# Patient Record
Sex: Female | Born: 1990 | Race: White | Hispanic: No
Health system: Midwestern US, Community
[De-identification: ages and names within clinical notes are randomized; demographics above are authoritative.]

## PROBLEM LIST (undated history)

## (undated) DIAGNOSIS — N390 Urinary tract infection, site not specified: Secondary | ICD-10-CM

## (undated) DIAGNOSIS — F32A Depression, unspecified: Secondary | ICD-10-CM

## (undated) DIAGNOSIS — G43909 Migraine, unspecified, not intractable, without status migrainosus: Secondary | ICD-10-CM

## (undated) DIAGNOSIS — R519 Headache, unspecified: Secondary | ICD-10-CM

## (undated) DIAGNOSIS — F4310 Post-traumatic stress disorder, unspecified: Secondary | ICD-10-CM

## (undated) DIAGNOSIS — K589 Irritable bowel syndrome without diarrhea: Secondary | ICD-10-CM

## (undated) DIAGNOSIS — T7840XA Allergy, unspecified, initial encounter: Secondary | ICD-10-CM

## (undated) HISTORY — PX: WISDOM TOOTH EXTRACTION: SHX21

## (undated) HISTORY — DX: Allergy, unspecified, initial encounter: T78.40XA

## (undated) HISTORY — DX: Depression, unspecified: F32.A

## (undated) HISTORY — PX: ARTHROSCOPIC REPAIR ACL: SUR80

## (undated) HISTORY — PX: CHOLECYSTECTOMY: SHX55

## (undated) HISTORY — DX: Irritable bowel syndrome, unspecified: K58.9

## (undated) HISTORY — DX: Post-traumatic stress disorder, unspecified: F43.10

## (undated) HISTORY — DX: Headache, unspecified: R51.9

## (undated) HISTORY — DX: Urinary tract infection, site not specified: N39.0

## (undated) HISTORY — DX: Migraine, unspecified, not intractable, without status migrainosus: G43.909

## (undated) MED ORDER — HYDROCODONE-ACETAMINOPHEN 5 MG-325 MG TAB
5-325 mg | ORAL_TABLET | Freq: Four times a day (QID) | ORAL | Status: DC | PRN
Start: ? — End: 2015-10-15

## (undated) MED ORDER — NAPROXEN 500 MG TAB
500 mg | ORAL_TABLET | Freq: Two times a day (BID) | ORAL | Status: AC
Start: ? — End: 2012-12-11

---

## 2012-12-04 MED ADMIN — ibuprofen (MOTRIN) tablet 800 mg: ORAL | @ 23:00:00 | NDC 00904585361

## 2012-12-04 NOTE — ED Notes (Signed)
I have reviewed discharge instructions with the patient.  The patient verbalized understanding.    Patient armband removed and given to patient to take home.  Patient was informed of the privacy risks if armband lost or stolen    The patient Kristina Baker is a 22 y.o. female who  has a past medical history of Anxiety; Depressed; and ADD (attention deficit disorder)..  She   The patient's medications were reviewed and discussed prior to discharge. The patient was very interactive and did understand their medications.    The following medications were discussed in details with the patient. The patient said [facility].      [email or web address] @    Luverta Korte Benay Pike, RN    MyChart Activation    Thank you for requesting access to MyChart. Please follow the instructions below to securely access and download your online medical record. MyChart allows you to send messages to your doctor, view your test results, renew your prescriptions, schedule appointments, and more.    How Do I Sign Up?    1. In your internet browser, go to [email or web address]  2. Click on the First Time User? Click Here link in the Sign In box. You will be redirect to the New Member Sign Up page.  3. Enter your MyChart Access Code exactly as it appears below. You will not need to use this code after you???ve completed the sign-up process. If you do not sign up before the expiration date, you must request a new code.    MyChart Access Code: [email or web address] @ (This is the date your MyChart access code will expire)    4. Enter the last four digits of your Social Security Number (xxxx) and Date of Birth (mm/dd/yyyy) as indicated and click Submit. You will be taken to the next sign-up page.  5. Create a MyChart ID. This will be your MyChart login ID and cannot be changed, so think of one that is secure and easy to remember.  6. Create a MyChart password. You can change your password at any time.  7. Enter your Password Reset  Question and Answer. This can be used at a later time if you forget your password.   8. Enter your e-mail address. You will receive e-mail notification when new information is available in MyChart.  9. Click Sign Up. You can now view and download portions of your medical record.  10. Click the Download Summary menu link to download a portable copy of your medical information.    Additional Information    If you have questions, please visit the Frequently Asked Questions section of the MyChart website at [email or web address] Remember, MyChart is NOT to be used for urgent needs. For medical emergencies, dial 911.

## 2012-12-04 NOTE — ED Provider Notes (Addendum)
HPI Comments: Kristina Baker is a 22 y.o. female with a History of anxiety, depression and ADD presents to the emergency department with c/o left knee pain. [place] the dog park [place] 5-6 dogs knocked her down. The dogs hit her at the level of her knee. She was able to get up with the assistance of bystanders but was unable to bear weight on the leg. Pt previously had an ACL repair in her left knee in 2007. She denies fever, chills, SOB, chest pain, abdominal pain, NVD, headache and dizziness. She expresses no other complaints at this time.     The history is provided by the patient.        Past Medical History   Diagnosis Date   ??? Anxiety    ??? Depressed    ??? ADD (attention deficit disorder)         History reviewed. No pertinent past surgical history.      History reviewed. No pertinent family history.     History     Social History   ??? Marital Status: SINGLE     Spouse Name: N/A     Number of Children: N/A   ??? Years of Education: N/A     Occupational History   ??? Not on file.     Social History Main Topics   ??? Smoking status: Never Smoker    ??? Smokeless tobacco: Not on file   ??? Alcohol Use: Yes      Comment: occ   ??? Drug Use: No   ??? Sexually Active: Not on file     Other Topics Concern   ??? Not on file     Social History Narrative   ??? No narrative on file                  ALLERGIES: Review of patient's allergies indicates no known allergies.      Review of Systems   Constitutional: Negative for fever and chills.   HENT: Negative for congestion and sore throat.    Eyes: Negative for visual disturbance.   Respiratory: Negative for shortness of breath.    Cardiovascular: Negative for chest pain.   Gastrointestinal: Negative for nausea, vomiting, abdominal pain and diarrhea.   Genitourinary: Negative for dysuria.   Musculoskeletal: Negative for back pain.        (+) left knee pain   Skin: Negative for rash.   Neurological: Negative for dizziness and headaches.   Psychiatric/Behavioral: Negative for dysphoric mood.        Filed Vitals:    12/04/12 1743   BP: 134/88   Pulse: 102   Temp: 98.5 ??F (36.9 ??C)   Resp: 16   Height: 5\' 6"  (1.676 m)   Weight: 102.059 kg (225 lb)   SpO2: 99%            Physical Exam   Nursing note and vitals reviewed.  Constitutional: She is oriented to person, place, and time. She appears well-developed and well-nourished. No distress.   HENT:   Head: Normocephalic and atraumatic.   Nose: Nose normal.   Eyes: EOM are normal.   Neck: Normal range of motion.   Cardiovascular: Intact distal pulses.    Pulmonary/Chest: Effort normal. No respiratory distress.   Abdominal: Soft. She exhibits no distension.   Musculoskeletal: She exhibits tenderness. She exhibits no edema.        Left knee: She exhibits no LCL laxity and no MCL laxity. Tenderness (above the  knee) found. No medial joint line and no lateral joint line tenderness noted.   Negative anterior and posterior drawer test. Pain at the knee with extension of knee. Post surgical scar to left knee, healed.    Neurological: She is alert and oriented to person, place, and time. She exhibits normal muscle tone. Coordination normal.   Sensation intact; good strength but some pain limitaiton to extend left knee   Skin: Skin is warm and dry. No rash noted. She is not diaphoretic.   Psychiatric: She has a normal mood and affect.        MDM     Differential Diagnosis; Clinical Impression; Plan:     Strain vs dislocation vs ligament/meniscal injury    Xray    Analgesia    Ace wrap and crutches.    Work note. [facility]  Rx motrin, vicodin  Amount and/or Complexity of Data Reviewed:   Tests in the radiology section of CPT??:  Ordered and reviewed  Risk of Significant Complications, Morbidity, and/or Mortality:   Presenting problems:  Moderate  Progress:   Patient progress:  Stable      Procedures    -------------------------------------------------------------------------------------------------------------------  PROGRESS NOTES:  5:43 PM: Ewing Schlein, DO is at the  bedside evaluating the patient. Discussed treatment plan with the patient.   6:33 PM: Apply ace wrap to knee and gave patient crutches. Pre and post ace wrap placement, neurovascular intact. Discussed diagnosis, prescriptions and follow up instructions. Patient is stable and ready for discharge.     CONSULTATIONS:  None    ORDERS:  Orders Placed This Encounter   ??? XR KNEE LT MIN 4 V   ??? APPLY ICE TO SPECIFIED AREA   ??? APPLY ACE WRAP:SPECIFY ONE TIME STAT   ??? CRUTCHES WITH INSTRUCTIONS   ??? traZODone (DESYREL) 50 mg tablet   ??? LORazepam (ATIVAN) 0.5 mg tablet   ??? buPROPion (WELLBUTRIN) 75 mg tablet   ??? OTHER   ??? venlafaxine-SR (EFFEXOR XR) 37.5 mg capsule   ??? methylphenidate (RITALIN) 5 mg tablet   ??? ibuprofen (MOTRIN) tablet 800 mg   ??? HYDROcodone-acetaminophen (NORCO) 5-325 mg per tablet   ??? naproxen (NAPROSYN) 500 mg tablet       MEDICATIONS:  Medications   ibuprofen (MOTRIN) tablet 800 mg (800 mg Oral Given 12/04/12 1756)        RADIOLOGY RESULTS:  XR KNEE LT MIN 4 V  interpretation per Ewing Schlein, DO: Postsurgical screws. No fracture.         LAB & EKG RESULTS:   No results found for this or any previous visit (from the past 12 hour(s)).     DISPOSITION:   Diagnosis:   1. Left knee sprain, initial encounter          Disposition: Discharged home    Follow-up Information    Follow up With Details Comments Contact Info    Wilford Riki Altes, MD Schedule an appointment as soon as possible for a visit  [address]  [address]               c  [facility] [place] [address]  [phone]      [facility] EMERGENCY DEPT  If symptoms worsen [address]  [place] [address]  [phone]          Current Discharge Medication List      START taking these medications    Details   HYDROcodone-acetaminophen (NORCO) 5-325  mg per tablet Take 1 tablet by mouth every six (6) hours as needed for Pain.  Qty: 12 tablet, Refills: 0      naproxen (NAPROSYN) 500 mg tablet Take 1 tablet by mouth two (2) times daily  (with meals) for 7 days. PRN pain  Qty: 14 tablet, Refills: 0         CONTINUE these medications which have NOT CHANGED    Details   traZODone (DESYREL) 50 mg tablet Take 50 mg by mouth nightly.      LORazepam (ATIVAN) 0.5 mg tablet Take 0.5 mg by mouth.      buPROPion (WELLBUTRIN) 75 mg tablet Take  by mouth two (2) times a day.      OTHER Birth control      venlafaxine-SR (EFFEXOR XR) 37.5 mg capsule Take 37.5 mg by mouth daily.      methylphenidate (RITALIN) 5 mg tablet Take 5 mg by mouth.              Teacher, early years/pre written by: Donell Beers (5:51 PM), scribing for and in the presence of Ewing Schlein, DO, ED Provider.    PROVIDER ATTESTATION STATEMENT   I personally performed the services described in the documentation, reviewed the documentation, as recorded by the scribe in my presence, and it accurately and completely records my words and actions.   Ewing Schlein, DO. 6:32 PM   -------------------------------------------------------------------------------------------------------------------

## 2013-02-20 NOTE — Progress Notes (Signed)
A user error has taken place: the following encounter has been closed for administrative purposes.

## 2015-09-23 ENCOUNTER — Emergency Department: Admit: 2015-09-23 | Payer: PRIVATE HEALTH INSURANCE | Primary: Internal Medicine

## 2015-09-23 ENCOUNTER — Inpatient Hospital Stay
Admit: 2015-09-23 | Discharge: 2015-09-26 | Disposition: A | Discharge status: Home / Self Care | Payer: PRIVATE HEALTH INSURANCE | Attending: Family Medicine | Admitting: Family Medicine

## 2015-09-23 DIAGNOSIS — A419 Sepsis, unspecified organism: Principal | ICD-10-CM

## 2015-09-23 LAB — METABOLIC PANEL, COMPREHENSIVE
A-G Ratio: 0.8 (ref 0.8–1.7)
ALT (SGPT): 158 U/L — ABNORMAL HIGH (ref 13–56)
AST (SGOT): 77 U/L — ABNORMAL HIGH (ref 15–37)
Albumin: 3.9 g/dL (ref 3.4–5.0)
Alk. phosphatase: 83 U/L (ref 45–117)
Anion gap: 13 mmol/L (ref 3.0–18)
BUN/Creatinine ratio: 15 (ref 12–20)
BUN: 15 MG/DL (ref 7.0–18)
Bilirubin, total: 0.9 MG/DL (ref 0.2–1.0)
CO2: 21 mmol/L (ref 21–32)
Calcium: 9.4 MG/DL (ref 8.5–10.1)
Chloride: 103 mmol/L (ref 100–108)
Creatinine: 0.99 MG/DL (ref 0.6–1.3)
GFR est AA: >60 mL/min/{1.73_m2} (ref >60)
GFR est non-AA: >60 mL/min/{1.73_m2} (ref >60)
Globulin: 4.6 g/dL — ABNORMAL HIGH (ref 2.0–4.0)
Glucose: 143 mg/dL — ABNORMAL HIGH (ref 74–99)
Potassium: 4.6 mmol/L (ref 3.5–5.5)
Protein, total: 8.5 g/dL — ABNORMAL HIGH (ref 6.4–8.2)
Sodium: 137 mmol/L (ref 136–145)

## 2015-09-23 LAB — CBC WITH AUTOMATED DIFF
ABS. BASOPHILS: 0 10*3/uL (ref 0.0–0.06)
ABS. EOSINOPHILS: 0 10*3/uL (ref 0.0–0.4)
ABS. LYMPHOCYTES: 0.6 10*3/uL — ABNORMAL LOW (ref 0.9–3.6)
ABS. MONOCYTES: 0.8 10*3/uL (ref 0.05–1.2)
ABS. NEUTROPHILS: 17.4 10*3/uL — ABNORMAL HIGH (ref 1.8–8.0)
BASOPHILS: 0 % (ref 0–2)
EOSINOPHILS: 0 % (ref 0–5)
HCT: 44.9 % (ref 35.0–45.0)
HGB: 15.2 g/dL (ref 12.0–16.0)
LYMPHOCYTES: 3 % — ABNORMAL LOW (ref 21–52)
MCH: 31.2 PG (ref 24.0–34.0)
MCHC: 33.9 g/dL (ref 31.0–37.0)
MCV: 92.2 FL (ref 74.0–97.0)
MONOCYTES: 4 % (ref 3–10)
MPV: 9.8 FL (ref 9.2–11.8)
NEUTROPHILS: 93 % — ABNORMAL HIGH (ref 40–73)
PLATELET: 348 10*3/uL (ref 135–420)
RBC: 4.87 M/uL (ref 4.20–5.30)
RDW: 12.1 % (ref 11.6–14.5)
WBC: 18.8 10*3/uL — ABNORMAL HIGH (ref 4.6–13.2)

## 2015-09-23 LAB — URINE MICROSCOPIC ONLY
RBC: 0 /hpf (ref 0–5)
WBC: 0 /hpf (ref 0–4)

## 2015-09-23 LAB — URINALYSIS W/ RFLX MICROSCOPIC
Blood: NEGATIVE
Glucose: NEGATIVE mg/dL
Nitrites: NEGATIVE
Specific gravity: >1.03 — ABNORMAL HIGH (ref 1.005–1.030)
Urobilinogen: 0.2 EU/dL (ref 0.2–1.0)
pH (UA): 5.5 (ref 5.0–8.0)

## 2015-09-23 LAB — HCG URINE, QL: HCG urine, QL: NEGATIVE

## 2015-09-23 LAB — LIPASE: Lipase: 109 U/L (ref 73–393)

## 2015-09-23 LAB — POC LACTIC ACID: Lactic Acid (POC): 3.6 mmol/L — CR (ref 0.4–2.0)

## 2015-09-23 MED ORDER — PIPERACILLIN-TAZOBACTAM 4.5 GRAM IV SOLR
4.5 gram | Freq: Four times a day (QID) | INTRAVENOUS | Status: DC
Start: 2015-09-23 — End: 2015-09-24
  Administered 2015-09-23 – 2015-09-24 (×2): via INTRAVENOUS

## 2015-09-23 MED ORDER — SODIUM CHLORIDE 0.9 % IJ SYRG
INTRAMUSCULAR | Status: DC | PRN
Start: 2015-09-23 — End: 2015-09-26

## 2015-09-23 MED ORDER — VANCOMYCIN 1,000 MG IV SOLR
1000 mg | Freq: Once | INTRAVENOUS | Status: AC
Start: 2015-09-23 — End: 2015-09-23
  Administered 2015-09-23: via INTRAVENOUS

## 2015-09-23 MED ORDER — SODIUM CHLORIDE 0.9% BOLUS IV
0.9 % | Freq: Once | INTRAVENOUS | Status: AC
Start: 2015-09-23 — End: 2015-09-23
  Administered 2015-09-23: via INTRAVENOUS

## 2015-09-23 MED ORDER — SODIUM CHLORIDE 0.9% BOLUS IV
0.9 % | Freq: Once | INTRAVENOUS | Status: AC
Start: 2015-09-23 — End: 2015-09-23
  Administered 2015-09-23: 22:00:00 via INTRAVENOUS

## 2015-09-23 MED ORDER — LEVOFLOXACIN IN D5W 750 MG/150 ML IV PIGGY BACK
750 mg/150 mL | INTRAVENOUS | Status: DC
Start: 2015-09-23 — End: 2015-09-25
  Administered 2015-09-23 – 2015-09-24 (×2): via INTRAVENOUS

## 2015-09-23 MED ORDER — IOPAMIDOL 61 % IV SOLN
300 mg iodine /mL (61 %) | Freq: Once | INTRAVENOUS | Status: AC
Start: 2015-09-23 — End: 2015-09-23
  Administered 2015-09-23: 23:00:00 via INTRAVENOUS

## 2015-09-23 MED ORDER — SODIUM CHLORIDE 0.9 % IV PIGGY BACK
3.375 gram | Freq: Four times a day (QID) | INTRAVENOUS | Status: DC
Start: 2015-09-23 — End: 2015-09-23

## 2015-09-23 MED ORDER — VANCOMYCIN 10 GRAM IV SOLR
10 gram | Freq: Once | INTRAVENOUS | Status: AC
Start: 2015-09-23 — End: 2015-09-24
  Administered 2015-09-24: 04:00:00 via INTRAVENOUS

## 2015-09-23 MED FILL — LEVOFLOXACIN IN D5W 750 MG/150 ML IV PIGGY BACK: 750 mg/150 mL | INTRAVENOUS | Qty: 150

## 2015-09-23 MED FILL — ISOVUE-300  61 % INTRAVENOUS SOLUTION: 300 mg iodine /mL (61 %) | INTRAVENOUS | Qty: 100

## 2015-09-23 MED FILL — SODIUM CHLORIDE 0.9 % IV: INTRAVENOUS | Qty: 1000

## 2015-09-23 MED FILL — SODIUM CHLORIDE 0.9 % IV: INTRAVENOUS | Qty: 3500

## 2015-09-23 MED FILL — PIPERACILLIN-TAZOBACTAM 4.5 GRAM IV SOLR: 4.5 gram | INTRAVENOUS | Qty: 4.5

## 2015-09-23 MED FILL — BD POSIFLUSH NORMAL SALINE 0.9 % INJECTION SYRINGE: INTRAMUSCULAR | Qty: 10

## 2015-09-23 MED FILL — VANCOMYCIN 1,000 MG IV SOLR: 1000 mg | INTRAVENOUS | Qty: 1000

## 2015-09-23 MED FILL — VANCOMYCIN 10 GRAM IV SOLR: 10 gram | INTRAVENOUS | Qty: 1500

## 2015-09-23 NOTE — ED Notes (Signed)
Per CT tech patient was taken to xray from CT. Unable to start medications until pt returns.

## 2015-09-23 NOTE — Progress Notes (Signed)
Pharmacy Dosing Services: Vancomycin    Indication: Sepsis of Unknown Etiology    Day of therapy: 0    Other Antimicrobials (Include dose, start day & day of therapy):  Levofloxacin 750 mg every 24 hours, 1826 09-23-2015, Piperacillin-Tazobactam 4.5 grams every 6 hours, 1826 09-23-2015    Loading dose (date given): 1,000 + 1,500 = 2,500 mg bolus  Current Maintenance dose:  1,000 mg every 8 hours    Goal Vancomycin Level: 15-20 mcg/mL  (Trough 15-20 for most infections, 20 for meningitis/osteomyelitis, pre-HD level ~25)     Significant Cultures: 2 blood cultures taken    Renal function stable? (unstable defined as SCr increase of 0.5 mg/dL or > 16% increase from baseline, whichever is greater) (Y/N): Y     CAPD, Hemodialysis or Renal Replacement Therapy (Y/N): Y     Recent Labs      09/23/15   1755   CREA  0.99   BUN  15   WBC  18.8*     Temp (24hrs), Avg:98.5 ??F (36.9 ??C), Min:97.4 ??F (36.3 ??C), Max:99.9 ??F (37.7 ??C)    Creatinine Clearance (Creatinine Clearance (ml/min)): 102.7 mcg/mL     Regimen assessment: Sepsis patient starting vancomycin, Zosyn and   Maintenance dose: 1,000 mg every 8 hours  Next scheduled level: 09-25-2015 at 0730       Pharmacy will follow daily and adjust medications as appropriate for renal function and/or serum levels.    Thank you,  [employer].D.

## 2015-09-23 NOTE — Progress Notes (Addendum)
Received patient into room 2101 from ED. Patient's mom at bedside. Oriented patient to room and call bell. Call bell and belongings within reach. Vitals and assessment to be completed.     @0200 . Stool specimen collected and sent to lab.     @0750  Bedside shift change report given to Victorino DikeJennifer, Charity fundraiserN (oncoming nurse) by Iva LentoStar RN (offgoing nurse). Report included the following information SBAR.

## 2015-09-23 NOTE — ED Triage Notes (Signed)
Patient states she ate a meal at [facility] yesterday and has been vomiting since then, diarrhea is uncontrollable

## 2015-09-23 NOTE — ED Notes (Signed)
Pt's redness on her face is decreasing greatly.

## 2015-09-23 NOTE — ED Notes (Addendum)
Pt reported hot flushing of her face, per Dr. Romie Jumper to stop the Vancomycin for an hour and give her 25 mg of Benadryl, and restart vanc at half the rate.

## 2015-09-23 NOTE — ED Provider Notes (Signed)
Patient is a 25 y.o. female presenting with abdominal pain, vomiting, diarrhea, and dizziness. The history is provided by the patient.   Abdominal Pain    Associated symptoms include diarrhea and vomiting.   Vomiting    Associated symptoms include abdominal pain and diarrhea.   Diarrhea    Associated symptoms include diarrhea and vomiting.   Dizziness   Associated symptoms include vomiting.   Kristina Baker is a 25 y.o. female with history of ADD, anxiety presents with nausea, vomiting, diarrhea, abdominal pain for the past day. Was driving home from [place].stopped at [place] and had chicken last night at 9 pm. Started not feeling well last night but this morning started with diarrhea, N/V and mild abdominal pain. Denies fever.   Past Medical History:   Diagnosis Date   ??? ADD (attention deficit disorder)    ??? Anxiety    ??? Depressed        No past surgical history on file.      No family history on file.    Social History     Social History   ??? Marital status: SINGLE     Spouse name: N/A   ??? Number of children: N/A   ??? Years of education: N/A     Occupational History   ??? Not on file.     Social History Main Topics   ??? Smoking status: Never Smoker   ??? Smokeless tobacco: Not on file   ??? Alcohol use Yes      Comment: occ   ??? Drug use: No   ??? Sexual activity: Not on file     Other Topics Concern   ??? Not on file     Social History Narrative   ??? No narrative on file         ALLERGIES: Review of patient's allergies indicates no known allergies.    Review of Systems   Gastrointestinal: Positive for abdominal pain, diarrhea and vomiting.   Neurological: Positive for dizziness.   Constitutional:  Denies malaise, fever, chills.   Head:  Denies injury.   Face:  Denies injury or pain.   ENMT:  Denies sore throat.   Neck:  Denies injury or pain.   Chest:  Denies injury.   Cardiac:  Denies chest pain or palpitations.   Respiratory:  Denies cough, wheezing, difficulty breathing, shortness of breath.    GI/ABD:  pain, nausea, vomiting, diarrhea. Denies injury   GU:  Denies injury, pain, dysuria or urgency.   Back:  Denies injury or pain.   Pelvis:  Denies injury or pain.   Extremity/MS:  Denies injury or pain.   Neuro:   Dizziness, Denies headache, LOC, neurologic symptoms/deficits/paresthesias.   Skin: Denies injury, rash, itching or skin changes.      Vitals:    09/23/15 1739   BP: 124/83   Pulse: (!) 114   Resp: 16   Temp: 97.4 ??F (36.3 ??C)   SpO2: 100%   Weight: 101.6 kg (224 lb)   Height: 5' 6" (1.676 m)            Physical Exam   Nursing note and vitals reviewed.  CONSTITUTIONAL: Alert, in no apparent distress; well-developed; well-nourished.   HEAD:  Normocephalic, atraumatic.   EYES: PERRL; EOM's intact.   ENTM: Nose: No rhinorrhea; Throat: mucous membranes moist. Posterior pharynx-normal.  Neck:  No JVD, supple without lymphadenopathy.  RESP: Chest clear, equal breath sounds.   CV: S1 and S2 WNL; No murmurs, gallops  or rubs.   GI: Abdomen soft and mild generalized tenderness, +BS, NG, NR. No masses or organomegaly.   UPPER EXT:  Normal inspection.   LOWER EXT: Normal inspection.  NEURO: CN 3-12 grossly intact, no pronator drift, strength 5/5 and sym, sensation intact.   SKIN: No rashes; Normal for age and stage.   PSYCH:  Alert and oriented, normal affect.       MDM  Number of Diagnoses or Management Options  Non-intractable vomiting with nausea, unspecified vomiting type:   Sepsis, due to unspecified organism [facility]):   Diagnosis management comments: DDx: gastroenteritis, GERD, hernia, hepatitis, pancreatitis, gallbladder etiology, constipation, adhesions, UTI, pyelo, kidney stones, STD,  Fitz-Hugh-Curtis syndrome, preg, ectopic, ovarian cyst, ovarian torsion, tubo-ovarian abscess, appendicitis, diverticulitis, SBO, GI bleed, mesenteric ischemia, AAA, cardiac etiology, musculoskeletal pain/spasm, malignancy  IMPRESSION AND MEDICAL DECISION MAKING:   Based upon the patient's presentation with noted HPI and PE, along with the work up done in the emergency department, I believe that the patient is septic. Will consult with hospitalist for admission.         Amount and/or Complexity of Data Reviewed  Clinical lab tests: ordered and reviewed  Tests in the radiology section of CPT??: ordered and reviewed  Tests in the medicine section of CPT??: ordered and reviewed  Discuss the patient with other providers: yes (Spoke with Hospitalist and agrees to admit Pt. States wants attending to read CT. Called Radiology attending and requested reading.)  Independent visualization of images, tracings, or specimens: yes      ED Course     Sepsis 3-6 hour reevaluation and exam:       Reevaluation:    Vital Signs:   Patient Vitals for the past 12 hrs:   Temp Pulse Resp BP SpO2   09/23/15 1937 98 ??F (36.7 ??C) (!) 116 18 103/86 100 %   09/23/15 1822 - 90 - - -   09/23/15 1739 97.4 ??F (36.3 ??C) (!) 114 16 124/83 100 %       Cardiopulmonary assessment:  RESP: Chest clear, equal breath sounds.  CV: S1 and S2 WNL; No murmurs, gallops or rubs.  Capillary refill:  <2  Peripheral pulse:   good    Skin exam:  Skin color: pale  Skin Turgor: good    Sepsis 3-6 hour reevaluation and exam performed at 8:34 PM  .        Procedures    Vitals:  Patient Vitals for the past 12 hrs:   Temp Pulse Resp BP SpO2   09/23/15 1937 98 ??F (36.7 ??C) (!) 116 18 103/86 100 %   09/23/15 1822 - 90 - - -   09/23/15 1739 97.4 ??F (36.3 ??C) (!) 114 16 124/83 100 %         Medications ordered:   Medications   sodium chloride (NS) flush 5-10 mL (not administered)   vancomycin (VANCOCIN) 1,000 mg in 0.9% sodium chloride (MBP/ADV) 250 mL adv (1,000 mg IntraVENous New Bag 09/23/15 1957)   piperacillin-tazobactam (ZOSYN) 4.5 g in 0.9% sodium chloride (MBP/ADV) 100 mL MBP (4.5 g IntraVENous New Bag 09/23/15 1931)   levoFLOXacin (LEVAQUIN) 750 mg in D5W IVPB (750 mg IntraVENous New Bag 09/23/15 1951)    vancomycin (VANCOCIN) 1,500 mg in 0.9% sodium chloride 500 mL IVPB (not administered)   sodium chloride 0.9 % bolus infusion 1,000 mL (0 mL IntraVENous IV Completed 09/23/15 1951)   sodium chloride 0.9 % bolus infusion 3,048 mL (3,048 mL IntraVENous New Bag  09/23/15 1930)   iopamidol (ISOVUE 300) 61 % contrast injection 100 mL (100 mL IntraVENous Given 09/23/15 1910)         Lab findings:  Recent Results (from the past 12 hour(s))   URINALYSIS W/ RFLX MICROSCOPIC    Collection Time: 09/23/15  5:50 PM   Result Value Ref Range    Color DARK YELLOW      Appearance CLOUDY      Specific gravity >1.030 (H) 1.005 - 1.030    pH (UA) 5.5 5.0 - 8.0      Protein TRACE (A) NEG mg/dL    Glucose NEGATIVE  NEG mg/dL    Ketone TRACE (A) NEG mg/dL    Bilirubin SMALL (A) NEG      Blood NEGATIVE  NEG      Urobilinogen 0.2 0.2 - 1.0 EU/dL    Nitrites NEGATIVE  NEG      Leukocyte Esterase TRACE (A) NEG     HCG URINE, QL    Collection Time: 09/23/15  5:50 PM   Result Value Ref Range    HCG urine, Ql. NEGATIVE  NEG     URINE MICROSCOPIC ONLY    Collection Time: 09/23/15  5:50 PM   Result Value Ref Range    WBC 0 to 3 0 - 4 /hpf    RBC 0 0 - 5 /hpf    Epithelial cells 2+ 0 - 5 /lpf    Bacteria 1+ (A) NEG /hpf    Mucus 3+ (A) NEG /lpf   CBC WITH AUTOMATED DIFF    Collection Time: 09/23/15  5:55 PM   Result Value Ref Range    WBC 18.8 (H) 4.6 - 13.2 K/uL    RBC 4.87 4.20 - 5.30 M/uL    HGB 15.2 12.0 - 16.0 g/dL    HCT 44.9 35.0 - 45.0 %    MCV 92.2 74.0 - 97.0 FL    MCH 31.2 24.0 - 34.0 PG    MCHC 33.9 31.0 - 37.0 g/dL    RDW 12.1 11.6 - 14.5 %    PLATELET 348 135 - 420 K/uL    MPV 9.8 9.2 - 11.8 FL    NEUTROPHILS 93 (H) 40 - 73 %    LYMPHOCYTES 3 (L) 21 - 52 %    MONOCYTES 4 3 - 10 %    EOSINOPHILS 0 0 - 5 %    BASOPHILS 0 0 - 2 %    ABS. NEUTROPHILS 17.4 (H) 1.8 - 8.0 K/UL    ABS. LYMPHOCYTES 0.6 (L) 0.9 - 3.6 K/UL    ABS. MONOCYTES 0.8 0.05 - 1.2 K/UL    ABS. EOSINOPHILS 0.0 0.0 - 0.4 K/UL    ABS. BASOPHILS 0.0 0.0 - 0.06 K/UL     DF AUTOMATED     LIPASE    Collection Time: 09/23/15  5:55 PM   Result Value Ref Range    Lipase 109 73 - 734 U/L   METABOLIC PANEL, COMPREHENSIVE    Collection Time: 09/23/15  5:55 PM   Result Value Ref Range    Sodium 137 136 - 145 mmol/L    Potassium 4.6 3.5 - 5.5 mmol/L    Chloride 103 100 - 108 mmol/L    CO2 21 21 - 32 mmol/L    Anion gap 13 3.0 - 18 mmol/L    Glucose 143 (H) 74 - 99 mg/dL    BUN 15 7.0 - 18 MG/DL    Creatinine 0.99 0.6 - 1.3  MG/DL    BUN/Creatinine ratio 15 12 - 20      GFR est AA >60 >60 ml/min/1.53m    GFR est non-AA >60 >60 ml/min/1.713m   Calcium 9.4 8.5 - 10.1 MG/DL    Bilirubin, total 0.9 0.2 - 1.0 MG/DL    ALT (SGPT) 158 (H) 13 - 56 U/L    AST (SGOT) 77 (H) 15 - 37 U/L    Alk. phosphatase 83 45 - 117 U/L    Protein, total 8.5 (H) 6.4 - 8.2 g/dL    Albumin 3.9 3.4 - 5.0 g/dL    Globulin 4.6 (H) 2.0 - 4.0 g/dL    A-G Ratio 0.8 0.8 - 1.7     EKG, 12 LEAD, INITIAL    Collection Time: 09/23/15  6:41 PM   Result Value Ref Range    Ventricular Rate 103 BPM    Atrial Rate 103 BPM    P-R Interval 170 ms    QRS Duration 76 ms    Q-T Interval 346 ms    QTC Calculation (Bezet) 453 ms    Calculated P Axis 14 degrees    Calculated R Axis 31 degrees    Calculated T Axis 26 degrees    Diagnosis       Sinus tachycardia  Otherwise normal ECG  No previous ECGs available     POC LACTIC ACID    Collection Time: 09/23/15  6:47 PM   Result Value Ref Range    Lactic Acid (POC) 3.6 (HH) 0.4 - 2.0 mmol/L       EKG interpretation by ED Physician:      X-Ray, CT or other radiology findings or impressions:  XR CHEST PORT    (Results Pending)   CT ABD PELV W CONT    (Results Pending)       Progress notes, Consult notes or additional Procedure notes:       Disposition:  Diagnosis:   1. Sepsis, due to unspecified organism (HCLa[facility]   2. Non-intractable vomiting with nausea, unspecified vomiting type        Disposition:   8:52 PM  Pt reevaluated at this time and is resting comfortably in NAD.  Discussed results and findings, as well as, diagnosis and plan for discharge. Pt verbalizes understanding and agreement with plan. All questions addressed at this time.     Follow-up Information     None           Patient's Medications   Start Taking    No medications on file   Continue Taking    BUPROPION (WELLBUTRIN) 75 MG TABLET    Take  by mouth two (2) times a day.    HYDROCODONE-ACETAMINOPHEN (NORCO) 5-325 MG PER TABLET    Take 1 tablet by mouth every six (6) hours as needed for Pain.    LORAZEPAM (ATIVAN) 0.5 MG TABLET    Take 0.5 mg by mouth.    METHYLPHENIDATE (RITALIN) 5 MG TABLET    Take 5 mg by mouth.    OTHER    Birth control    TRAZODONE (DESYREL) 50 MG TABLET    Take 50 mg by mouth nightly.    VENLAFAXINE-SR (EFFEXOR XR) 37.5 MG CAPSULE    Take 37.5 mg by mouth daily.   These Medications have changed    No medications on file   Stop Taking    No medications on file

## 2015-09-23 NOTE — ED Notes (Signed)
Pt ambulated to restroom to void.

## 2015-09-23 NOTE — ED Notes (Signed)
TRANSFER - OUT REPORT:    Verbal report given to Star, RN(name) on Kristina Baker  being transferred to 2100(unit) for routine progression of care       Report consisted of patient???s Situation, Background, Assessment and   Recommendations(SBAR).     Information from the following report(s) SBAR and ED Summary was reviewed with the receiving nurse.    Lines:       Opportunity for questions and clarification was provided.      Patient transported with:   [employer]

## 2015-09-23 NOTE — H&P (Signed)
History and Physical    Patient: Kristina Baker               Sex: female          DOA: 09/23/2015       Date of Birth:  May 28, 1990      Age:  25 y.o.        LOS:  LOS: 0 days        Chief Complaint   Patient presents with   ??? Abdominal Pain   ??? Vomiting   ??? Diarrhea   ??? Dizziness         HPI:     Kristina Baker is a 25 y.o. female who presents with c/o abdominal pain , vomiting and diarrhea. Patient reports that she developed abdominal pain yesterday. This occurred a few hours after a dinner meal of chicken at [employer] station. The pain was diffuse and non radiating. She also had associated nausea and this morning had profuse vomiting and diarrhea. She was also diaphoretic. She denies chest pain, sob , dysuria or back pain. Today she is cramping in both legs. In the ED she was started on antibiotic for presumed sepsis. She has WBC 18.8, Lactic acid 3.6. CT abdomen suggest gastroenteritis. In ED patient was noted to have a reaction to Vancomycin and was stopped and given Benadryl. Patient will be admitted for ongoing treatment.       Past Medical History:   Diagnosis Date   ??? ADD (attention deficit disorder)    ??? Anxiety    ??? Depressed    .    No past surgical history on file.    No current facility-administered medications on file prior to encounter.      Current Outpatient Prescriptions on File Prior to Encounter   Medication Sig Dispense Refill   ??? traZODone (DESYREL) 50 mg tablet Take 50 mg by mouth nightly.     ??? LORazepam (ATIVAN) 0.5 mg tablet Take 0.5 mg by mouth.     ??? buPROPion (WELLBUTRIN) 75 mg tablet Take  by mouth two (2) times a day.     ??? OTHER Birth control     ??? venlafaxine-SR (EFFEXOR XR) 37.5 mg capsule Take 37.5 mg by mouth daily.     ??? methylphenidate (RITALIN) 5 mg tablet Take 5 mg by mouth.     ??? HYDROcodone-acetaminophen (NORCO) 5-325 mg per tablet Take 1 tablet by mouth every six (6) hours as needed for Pain. 12 tablet 0       Social History     Social History   ??? Marital status: SINGLE      Spouse name: N/A   ??? Number of children: N/A   ??? Years of education: N/A     Occupational History   ??? Not on file.     Social History Main Topics   ??? Smoking status: Never Smoker   ??? Smokeless tobacco: Not on file   ??? Alcohol use Yes      Comment: occ   ??? Drug use: No   ??? Sexual activity: Not on file     Other Topics Concern   ??? Not on file     Social History Narrative   ??? No narrative on file       Prior to Admission Medications   Prescriptions Last Dose Informant Patient Reported? Taking?   HYDROcodone-acetaminophen (NORCO) 5-325 mg per tablet   No No   Sig: Take 1 tablet by mouth every six (  6) hours as needed for Pain.   LORazepam (ATIVAN) 0.5 mg tablet   Yes No   Sig: Take 0.5 mg by mouth.   OTHER   Yes No   Sig: Birth control   buPROPion (WELLBUTRIN) 75 mg tablet   Yes No   Sig: Take  by mouth two (2) times a day.   methylphenidate (RITALIN) 5 mg tablet   Yes No   Sig: Take 5 mg by mouth.   traZODone (DESYREL) 50 mg tablet   Yes No   Sig: Take 50 mg by mouth nightly.   venlafaxine-SR (EFFEXOR XR) 37.5 mg capsule   Yes No   Sig: Take 37.5 mg by mouth daily.      Facility-Administered Medications: None       No family history on file.    No Known Allergies    Review of Systems   Constitutional: Positive for diaphoresis.   HENT: Negative.    Eyes: Negative.    Respiratory: Negative.    Cardiovascular: Negative.    Gastrointestinal: Positive for abdominal pain, diarrhea, nausea and vomiting.   Genitourinary: Negative.    Musculoskeletal: Negative.    Skin: Negative.    Neurological: Negative.    Endo/Heme/Allergies: Negative.    Psychiatric/Behavioral: Negative.        Physical Exam:       Visit Vitals   ??? BP 122/71 (BP 1 Location: Right arm, BP Patient Position: At rest)   ??? Pulse (!) 115   ??? Temp 99 ??F (37.2 ??C)   ??? Resp 14   ??? Ht '5\' 6"'$  (1.676 m)   ??? Wt 101.6 kg (224 lb)   ??? SpO2 100%   ??? BMI 36.15 kg/m2       Physical Exam   Constitutional: She is oriented to person, place, and time. She appears  well-developed and well-nourished. She appears ill. No distress.   HENT:   Head: Normocephalic and atraumatic.   Eyes: Conjunctivae are normal. Pupils are equal, round, and reactive to light. No scleral icterus.   Neck: Neck supple.   Cardiovascular: Regular rhythm.  Tachycardia present.    capp refill < 3 sec  Rt Radial Pulse +2   Pulmonary/Chest: Effort normal and breath sounds normal. No respiratory distress. She has no wheezes. She has no rales.   Abdominal: Soft. Bowel sounds are normal. She exhibits no distension. There is no tenderness. There is no guarding.   Musculoskeletal: She exhibits no edema.   Neurological: She is alert and oriented to person, place, and time.   Skin: Skin is warm. No rash noted. No erythema. No pallor.   Psychiatric: She has a normal mood and affect.   Vitals reviewed.      Ancillary Studies:  All lab and imaging reviewed for the past 24 hours.    Recent Results (from the past 24 hour(s))   URINALYSIS W/ RFLX MICROSCOPIC    Collection Time: 09/23/15  5:50 PM   Result Value Ref Range    Color DARK YELLOW      Appearance CLOUDY      Specific gravity >1.030 (H) 1.005 - 1.030    pH (UA) 5.5 5.0 - 8.0      Protein TRACE (A) NEG mg/dL    Glucose NEGATIVE  NEG mg/dL    Ketone TRACE (A) NEG mg/dL    Bilirubin SMALL (A) NEG      Blood NEGATIVE  NEG      Urobilinogen 0.2 0.2 - 1.0 EU/dL  Nitrites NEGATIVE  NEG      Leukocyte Esterase TRACE (A) NEG     HCG URINE, QL    Collection Time: 09/23/15  5:50 PM   Result Value Ref Range    HCG urine, Ql. NEGATIVE  NEG     URINE MICROSCOPIC ONLY    Collection Time: 09/23/15  5:50 PM   Result Value Ref Range    WBC 0 to 3 0 - 4 /hpf    RBC 0 0 - 5 /hpf    Epithelial cells 2+ 0 - 5 /lpf    Bacteria 1+ (A) NEG /hpf    Mucus 3+ (A) NEG /lpf   CBC WITH AUTOMATED DIFF    Collection Time: 09/23/15  5:55 PM   Result Value Ref Range    WBC 18.8 (H) 4.6 - 13.2 K/uL    RBC 4.87 4.20 - 5.30 M/uL    HGB 15.2 12.0 - 16.0 g/dL    HCT 44.9 35.0 - 45.0 %     MCV 92.2 74.0 - 97.0 FL    MCH 31.2 24.0 - 34.0 PG    MCHC 33.9 31.0 - 37.0 g/dL    RDW 12.1 11.6 - 14.5 %    PLATELET 348 135 - 420 K/uL    MPV 9.8 9.2 - 11.8 FL    NEUTROPHILS 93 (H) 40 - 73 %    LYMPHOCYTES 3 (L) 21 - 52 %    MONOCYTES 4 3 - 10 %    EOSINOPHILS 0 0 - 5 %    BASOPHILS 0 0 - 2 %    ABS. NEUTROPHILS 17.4 (H) 1.8 - 8.0 K/UL    ABS. LYMPHOCYTES 0.6 (L) 0.9 - 3.6 K/UL    ABS. MONOCYTES 0.8 0.05 - 1.2 K/UL    ABS. EOSINOPHILS 0.0 0.0 - 0.4 K/UL    ABS. BASOPHILS 0.0 0.0 - 0.06 K/UL    DF AUTOMATED     LIPASE    Collection Time: 09/23/15  5:55 PM   Result Value Ref Range    Lipase 109 73 - 962 U/L   METABOLIC PANEL, COMPREHENSIVE    Collection Time: 09/23/15  5:55 PM   Result Value Ref Range    Sodium 137 136 - 145 mmol/L    Potassium 4.6 3.5 - 5.5 mmol/L    Chloride 103 100 - 108 mmol/L    CO2 21 21 - 32 mmol/L    Anion gap 13 3.0 - 18 mmol/L    Glucose 143 (H) 74 - 99 mg/dL    BUN 15 7.0 - 18 MG/DL    Creatinine 0.99 0.6 - 1.3 MG/DL    BUN/Creatinine ratio 15 12 - 20      GFR est AA >60 >60 ml/min/1.62m    GFR est non-AA >60 >60 ml/min/1.742m   Calcium 9.4 8.5 - 10.1 MG/DL    Bilirubin, total 0.9 0.2 - 1.0 MG/DL    ALT (SGPT) 158 (H) 13 - 56 U/L    AST (SGOT) 77 (H) 15 - 37 U/L    Alk. phosphatase 83 45 - 117 U/L    Protein, total 8.5 (H) 6.4 - 8.2 g/dL    Albumin 3.9 3.4 - 5.0 g/dL    Globulin 4.6 (H) 2.0 - 4.0 g/dL    A-G Ratio 0.8 0.8 - 1.7     EKG, 12 LEAD, INITIAL    Collection Time: 09/23/15  6:41 PM   Result Value Ref Range    Ventricular Rate  103 BPM    Atrial Rate 103 BPM    P-R Interval 170 ms    QRS Duration 76 ms    Q-T Interval 346 ms    QTC Calculation (Bezet) 453 ms    Calculated P Axis 14 degrees    Calculated R Axis 31 degrees    Calculated T Axis 26 degrees    Diagnosis       Sinus tachycardia  Otherwise normal ECG  No previous ECGs available     POC LACTIC ACID    Collection Time: 09/23/15  6:47 PM   Result Value Ref Range    Lactic Acid (POC) 3.6 (HH) 0.4 - 2.0 mmol/L    POC LACTIC ACID    Collection Time: 09/23/15  9:31 PM   Result Value Ref Range    Lactic Acid (POC) 2.4 (HH) 0.4 - 2.0 mmol/L       Assessment/Plan     Principal Problem:    Gastroenteritis (09/23/2015)    Active Problems:    Abdominal pain (09/23/2015)      Sepsis (HCC) (09/23/2015)      Insomnia (09/23/2015)      Anxiety (09/23/2015)      Depression (09/23/2015)      Hepatic steatosis (09/23/2015)      Lactic acidosis (09/23/2015)      Sinus Tachycardia  Leg cramping     PLAN:    Gastroenteritis  Likely Infective  . Stool culture pending ordered by ED  Continue antibiotics pending blood culture   Advance diet as tolerable    Possible sepsis  2/2 Infective abdominal source   Continue antibiotics   Follow BP   Follow lactic acid    Abdominal pain  2/2 above   Pain control as needed. CT abdomen report noted    Lactic acidosis  Trending down  Monitor     Sinus Tachycardia   2/2 sepsis vs hypovolemia   IVF  Monitor     Leg Cramping   CK and Magnesium  Ordered and pending    Insomnia   Trazodone    Anxiety and Depression  Wellbutrin  Effexor    Hepatic steatosis  Elevated AST and ALT. Recheck  Recommend Follow up outpatient and weight loss    DVT Prophylaxis     Full code      Joylene Igo, MD  09/23/2015  8:51 PM

## 2015-09-23 NOTE — ED Notes (Signed)
Tech obtaining blood cultures.

## 2015-09-24 LAB — METABOLIC PANEL, COMPREHENSIVE
A-G Ratio: 0.9 (ref 0.8–1.7)
ALT (SGPT): 97 U/L — ABNORMAL HIGH (ref 13–56)
AST (SGOT): 38 U/L — ABNORMAL HIGH (ref 15–37)
Albumin: 3 g/dL — ABNORMAL LOW (ref 3.4–5.0)
Alk. phosphatase: 57 U/L (ref 45–117)
Anion gap: 9 mmol/L (ref 3.0–18)
BUN/Creatinine ratio: 14 (ref 12–20)
BUN: 10 MG/DL (ref 7.0–18)
Bilirubin, total: 0.5 MG/DL (ref 0.2–1.0)
CO2: 23 mmol/L (ref 21–32)
Calcium: 7.5 MG/DL — ABNORMAL LOW (ref 8.5–10.1)
Chloride: 106 mmol/L (ref 100–108)
Creatinine: 0.74 MG/DL (ref 0.6–1.3)
GFR est AA: >60 mL/min/{1.73_m2} (ref >60)
GFR est non-AA: >60 mL/min/{1.73_m2} (ref >60)
Globulin: 3.2 g/dL (ref 2.0–4.0)
Glucose: 98 mg/dL (ref 74–99)
Potassium: 4 mmol/L (ref 3.5–5.5)
Protein, total: 6.2 g/dL — ABNORMAL LOW (ref 6.4–8.2)
Sodium: 138 mmol/L (ref 136–145)

## 2015-09-24 LAB — CBC WITH AUTOMATED DIFF
ABS. BASOPHILS: 0 10*3/uL (ref 0.0–0.06)
ABS. EOSINOPHILS: 0 10*3/uL (ref 0.0–0.4)
ABS. LYMPHOCYTES: 1.1 10*3/uL (ref 0.9–3.6)
ABS. MONOCYTES: 1 10*3/uL (ref 0.05–1.2)
ABS. NEUTROPHILS: 8.6 10*3/uL — ABNORMAL HIGH (ref 1.8–8.0)
BASOPHILS: 0 % (ref 0–2)
EOSINOPHILS: 0 % (ref 0–5)
HCT: 36.5 % (ref 35.0–45.0)
HGB: 12.2 g/dL (ref 12.0–16.0)
LYMPHOCYTES: 11 % — ABNORMAL LOW (ref 21–52)
MCH: 31.3 PG (ref 24.0–34.0)
MCHC: 33.4 g/dL (ref 31.0–37.0)
MCV: 93.6 FL (ref 74.0–97.0)
MONOCYTES: 9 % (ref 3–10)
MPV: 10.3 FL (ref 9.2–11.8)
NEUTROPHILS: 80 % — ABNORMAL HIGH (ref 40–73)
PLATELET: 255 10*3/uL (ref 135–420)
RBC: 3.9 M/uL — ABNORMAL LOW (ref 4.20–5.30)
RDW: 12.3 % (ref 11.6–14.5)
WBC: 10.7 10*3/uL (ref 4.6–13.2)

## 2015-09-24 LAB — EKG, 12 LEAD, INITIAL
Atrial Rate: 103 {beats}/min
Calculated P Axis: 14 degrees
Calculated R Axis: 31 degrees
Calculated T Axis: 26 degrees
P-R Interval: 170 ms
Q-T Interval: 346 ms
QRS Duration: 76 ms
QTC Calculation (Bezet): 453 ms
Ventricular Rate: 103 {beats}/min

## 2015-09-24 LAB — POC LACTIC ACID: Lactic Acid (POC): 2.4 mmol/L — CR (ref 0.4–2.0)

## 2015-09-24 LAB — PROTHROMBIN TIME + INR
INR: 1.2 (ref 0.8–1.2)
Prothrombin time: 14.4 s (ref 11.5–15.2)

## 2015-09-24 LAB — CK: CK: 67 U/L (ref 26–192)

## 2015-09-24 LAB — LACTIC ACID
Lactic acid: 2.1 MMOL/L — CR (ref 0.4–2.0)
Lactic acid: 1.9 MMOL/L (ref 0.4–2.0)

## 2015-09-24 LAB — EKG 12-LEAD
Atrial Rate: 103 {beats}/min
P Axis: 14 degrees
P-R Interval: 170 ms
Q-T Interval: 346 ms
QRS Duration: 76 ms
QTc Calculation (Bazett): 453 ms
R Axis: 31 degrees
T Axis: 26 degrees
Ventricular Rate: 103 {beats}/min

## 2015-09-24 MED ORDER — PHARMACY VANCOMYCIN NOTE
Status: DC
Start: 2015-09-24 — End: 2015-09-25

## 2015-09-24 MED ORDER — PIPERACILLIN-TAZOBACTAM 3.375 GRAM IV SOLR
3.375 gram | Freq: Three times a day (TID) | INTRAVENOUS | Status: DC
Start: 2015-09-24 — End: 2015-09-25
  Administered 2015-09-24 – 2015-09-25 (×4): via INTRAVENOUS

## 2015-09-24 MED ORDER — VENLAFAXINE SR 37.5 MG 24 HR CAP
37.5 mg | Freq: Every day | ORAL | Status: DC
Start: 2015-09-24 — End: 2015-09-25
  Administered 2015-09-24 – 2015-09-25 (×2): via ORAL

## 2015-09-24 MED ORDER — BUPROPION 75 MG TAB
75 mg | Freq: Two times a day (BID) | ORAL | Status: DC
Start: 2015-09-24 — End: 2015-09-26
  Administered 2015-09-24 – 2015-09-26 (×5): via ORAL

## 2015-09-24 MED ORDER — SODIUM CHLORIDE 0.9 % IV PIGGY BACK
1000 mg | Freq: Three times a day (TID) | INTRAVENOUS | Status: DC
Start: 2015-09-24 — End: 2015-09-25
  Administered 2015-09-24 – 2015-09-25 (×4): via INTRAVENOUS

## 2015-09-24 MED ORDER — TRAZODONE 50 MG TAB
50 mg | Freq: Every evening | ORAL | Status: DC
Start: 2015-09-24 — End: 2015-09-26
  Administered 2015-09-25 – 2015-09-26 (×2): via ORAL

## 2015-09-24 MED ORDER — ONDANSETRON (PF) 4 MG/2 ML INJECTION
4 mg/2 mL | INTRAMUSCULAR | Status: DC | PRN
Start: 2015-09-24 — End: 2015-09-26
  Administered 2015-09-24 – 2015-09-26 (×10): via INTRAVENOUS

## 2015-09-24 MED ORDER — NALOXONE 0.4 MG/ML INJECTION
0.4 mg/mL | INTRAMUSCULAR | Status: DC | PRN
Start: 2015-09-24 — End: 2015-09-26

## 2015-09-24 MED ORDER — PHARMACY VANCOMYCIN NOTE
Freq: Once | Status: DC
Start: 2015-09-24 — End: 2015-09-25
  Administered 2015-09-25: 11:00:00

## 2015-09-24 MED ORDER — NS WITH POTASSIUM CHLORIDE 20 MEQ/L IV
20 mEq/L | INTRAVENOUS | Status: DC
Start: 2015-09-24 — End: 2015-09-26
  Administered 2015-09-24 – 2015-09-26 (×5): via INTRAVENOUS

## 2015-09-24 MED ORDER — MORPHINE 2 MG/ML INJECTION
2 mg/mL | INTRAMUSCULAR | Status: DC | PRN
Start: 2015-09-24 — End: 2015-09-26
  Administered 2015-09-24 – 2015-09-25 (×8): via INTRAVENOUS

## 2015-09-24 MED ORDER — ENOXAPARIN 40 MG/0.4 ML SUB-Q SYRINGE
40 mg/0.4 mL | SUBCUTANEOUS | Status: DC
Start: 2015-09-24 — End: 2015-09-26
  Administered 2015-09-24 – 2015-09-26 (×3): via SUBCUTANEOUS

## 2015-09-24 MED ORDER — DIPHENHYDRAMINE HCL 50 MG/ML IJ SOLN
50 mg/mL | Freq: Once | INTRAMUSCULAR | Status: AC
Start: 2015-09-24 — End: 2015-09-23
  Administered 2015-09-24: 02:00:00 via INTRAVENOUS

## 2015-09-24 MED FILL — BUPROPION 75 MG TAB: 75 mg | ORAL | Qty: 1

## 2015-09-24 MED FILL — ZOSYN 3.375 GRAM INTRAVENOUS SOLUTION: 3.375 gram | INTRAVENOUS | Qty: 3.38

## 2015-09-24 MED FILL — MORPHINE 2 MG/ML INJECTION: 2 mg/mL | INTRAMUSCULAR | Qty: 1

## 2015-09-24 MED FILL — LEVOFLOXACIN IN D5W 750 MG/150 ML IV PIGGY BACK: 750 mg/150 mL | INTRAVENOUS | Qty: 150

## 2015-09-24 MED FILL — VANCOMYCIN 1,000 MG IV SOLR: 1000 mg | INTRAVENOUS | Qty: 1000

## 2015-09-24 MED FILL — PIPERACILLIN-TAZOBACTAM 4.5 GRAM IV SOLR: 4.5 gram | INTRAVENOUS | Qty: 4.5

## 2015-09-24 MED FILL — ONDANSETRON (PF) 4 MG/2 ML INJECTION: 4 mg/2 mL | INTRAMUSCULAR | Qty: 2

## 2015-09-24 MED FILL — VANCOMYCIN 10 GRAM IV SOLR: 10 gram | INTRAVENOUS | Qty: 1500

## 2015-09-24 MED FILL — SODIUM CHLORIDE 0.9 % IV PIGGY BACK: INTRAVENOUS | Qty: 250

## 2015-09-24 MED FILL — DIPHENHYDRAMINE HCL 50 MG/ML IJ SOLN: 50 mg/mL | INTRAMUSCULAR | Qty: 1

## 2015-09-24 MED FILL — NS WITH POTASSIUM CHLORIDE 20 MEQ/L IV: 20 mEq/L | INTRAVENOUS | Qty: 1000

## 2015-09-24 MED FILL — PHARMACY VANCOMYCIN NOTE: Qty: 1

## 2015-09-24 MED FILL — LOVENOX 40 MG/0.4 ML SUBCUTANEOUS SYRINGE: 40 mg/0.4 mL | SUBCUTANEOUS | Qty: 0.4

## 2015-09-24 MED FILL — VENLAFAXINE SR 37.5 MG 24 HR CAP: 37.5 mg | ORAL | Qty: 1

## 2015-09-24 NOTE — Progress Notes (Signed)
[facility]   Discharge Planning/Social Services Assessment    Reasons for Intervention: Spoke with pt, lives alone. Employee [place] in [place]. Designates her mother for dcp, mom present in interview. Independent with adls and amb. No dme. No pcp, referral to life coach. Demographics correct. Plan home.     High Risk Criteria   Yes  No   Physician Referral   Yes  No        Date    Nursing Referral   Yes  No        Date    Patient/Family Request   Yes  No        Date       Resources:    Medicare   Yes  No   Medicaid   Yes  No   No Resources   Yes  No   Private Insurance   Yes  No   Case Manager Name/Phone Number    Other   Yes  No        (i.e. Workman's Comp)         Prior Services:    Prior Services   Yes  No   Home Health   Yes  No   Agency    Private Home Care   Yes  No        Number of Hours    Home Care Program   Yes  No   Case Manager    Meals on Wheels   Yes  No   Office on Aging   Yes  No   Transportation Services   Yes  No   Nursing Home   Yes  No        Nursing Home Name    Rehab/VA Hospital   Yes  No        Rehab/VA Name    Other       Information Source:      Information obtained from   Patient   Parent    Guardian   Child   Spouse    Significant Other/Partner    Friend       EMS     Nursing Home Chart           Other:   Chart Review   Yes  No     Family/Support System:    Patient lives with   Alone     Spouse    Significant Other   Children   Caretaker    Parent   Sibling      Other       Other Support System:    Is the patient responsible for care of others   Yes  No   Information of person caring for patient on  discharge    Managers financial affairs independently   Yes  No   If no, explain:      Status Prior to Admission:    Mental Status   Awake   Alert   Oriented   Quiet/Calm  Lethargic/Sedated    Disoriented   Restless/Anxious   Combative   Personal Care   Dependent   Independent Personal Care   Requires Assistance   Meal Preparation Ability   Independent    Standby Assistance    Minimal  Assistance    Moderate Assistance   Maximum Assistance      Total Assistance   Chores   Independent with Chores    N/A Nursing Home Resident  Requires Assistance   Bowel/Bladder   Continent   Catheter   Incontinent   Ostomy Self-Care     Urine Diversion Self-Care   Maximum Assistance      Total Assistance   Number of Persons needed for assistance    DME at home   [place], [place], Straight    Commode     Bathroom/Grab [facility] Bed   Nebulizer   Oxygen            Raised Toilet Seat   Shower Chair   Side Rails for Bed    Tub Transfer Bench    Walker, Building surveyor, Standard       Other:   Vendor      Treatment Presently Receiving:    Current Treatments   Chemotherapy   Dialysis   Insulin   IVAB  IVF    O2   PCA    PT    RT    Tube Feedings    Wound Care     Psychosocial Evaluation:    Verbalized Knowledge of Disease Process   Patient  Family   Coping with Disease Process   Patient  Family   Requires Further Counseling Coping with Disease Process   Patient  Family     Identified Projected Needs:    Home Health Aid   Yes  No   Transportation   Yes  No   Education   Yes  No        Specific Education     Financial Counseling   Yes  No   Inability to Care for Self/Will Require 24 hour care   Yes  No   Pain Management   Yes  No   Home Infusion Therapy   Yes  No   Oxygen Therapy   Yes  No   DME   Yes  No   Long Term Care Placement   Yes  No   Rehab   Yes  No   Physical Therapy   Yes  No   Needs Anticipated At This Time   Yes  No     Intra-Hospital Referral:    [place]   Yes  No   Life Coach   Yes  No   Patient Representative   Yes  No   Staff for Teaching Needs   Yes  No   Specialty Teaching Needs     Diabetic Educator   Yes  No   Referral for Diabetic Educator Needed   Yes  No  If Yes, place order for Nutritionist or Diabetic Consult     Tentative Discharge Plan:    Home with No Services   Yes  No   Home with Home Health Follow-up   Yes  No        If Yes, specify type     Home Care Program   Yes  No        If Yes, specify type    Meals on Wheels   Yes  No   Office of Aging   Yes  No   NHP   Yes  No   Return to the Nursing Home   Yes  No   Rehab Therapy   Yes  No   Acute Rehab   Yes  No   Subacute Rehab   Yes  No   Private Care   Yes  No   Substance Abuse Referral  Yes  No   Transportation   Yes  No   Chore Service   Yes  No   Inpatient Hospice   Yes  No   OP RT   Yes   No   OP Hemo   Yes   No   OP PT   Yes  No   Support Group   Yes  No   Reach to Recovery   Yes  No   OP Oncology Clinic   Yes  No   Clinic Appointment   Yes  No   DME   Yes  No   Comments    Name of D/C Planner or Social Worker Given to Patient or Family Debra balbo rn cm    Phone Number [phone]        Extension    Date 09/24/15   Time    If you are discharged home, whom do you designate to participate in your discharge plan and receive any information needed?     Enter name of designee         Phone # of designee         Address of designee         Updated         Patient refused to designate any           individual

## 2015-09-24 NOTE — Progress Notes (Addendum)
@  1930 Received report from [place], Charity fundraiser. Assumed care of patient. Patient currently in bed watching TV. Mother at bedside. NAD. Call bell and belongings within reach. RN to continue to monitor patient.     @2220  Noted that C diff results negative. Informed Dr. Romie Jumper of results. D/c Enteric Isolation precautions. Patient requesting order for Imodium. No order received from MD for Imodium d/t results of stool culture still pending. Updated patient.     Bedside shift change report given to Freddy Finner, Charity fundraiser (Cabin crew) by Iva Lento, RN (offgoing nurse). Report included the following information SBAR and MAR.

## 2015-09-24 NOTE — Progress Notes (Signed)
Progress Note      Patient: Kristina Baker               Sex: female          DOA: 09/23/2015       Date of Birth:  Oct 13, 1990      Age:  25 y.o.        LOS:  LOS: 1 day             CHIEF COMPLAINT:  Ongoing diarrhea with moderate abdominal pain    Subjective:     Patient complains of nausea  Occasional abdominal pain  Copious diarrhea  Sometimes foul smelling    Objective:      Visit Vitals   ??? BP 115/70 (BP 1 Location: Right arm, BP Patient Position: At rest)   ??? Pulse 96   ??? Temp 99.4 ??F (37.4 ??C)   ??? Resp 18   ??? Ht 5\' 6"  (1.676 m)   ??? Wt 101.6 kg (224 lb)   ??? SpO2 95%   ??? Breastfeeding No   ??? BMI 36.15 kg/m2       Physical Exam:  Gen:  No distress, no complaint  Lungs:  Clear bilaterally, no wheeze or rhonchi  Heart:  Regular rate and rhythm, no murmurs or gallops  Abdomen:  Soft, non-tender, normal bowel sounds        Lab/Data Reviewed:  BMP:   Lab Results   Component Value Date/Time    NA 138 09/24/2015 04:15 AM    K 4.0 09/24/2015 04:15 AM    CL 106 09/24/2015 04:15 AM    CO2 23 09/24/2015 04:15 AM    AGAP 9 09/24/2015 04:15 AM    GLU 98 09/24/2015 04:15 AM    BUN 10 09/24/2015 04:15 AM    CREA 0.74 09/24/2015 04:15 AM    GFRAA >60 09/24/2015 04:15 AM    GFRNA >60 09/24/2015 04:15 AM     CBC:   Lab Results   Component Value Date/Time    WBC 10.7 09/24/2015 04:15 AM    HGB 12.2 09/24/2015 04:15 AM    HCT 36.5 09/24/2015 04:15 AM    PLT 255 09/24/2015 04:15 AM           Assessment/Plan     Principal Problem:    Gastroenteritis (09/23/2015)    Active Problems:    Abdominal pain (09/23/2015)      Sepsis (HCC) (09/23/2015)      Insomnia (09/23/2015)      Anxiety (09/23/2015)      Depression (09/23/2015)      Hepatic steatosis (09/23/2015)      Lactic acidosis (09/23/2015)        Plan:  Stool for C diff  Enteric precautions  Continue antibiotics  Discussed with patient and Mom at the bedside.

## 2015-09-24 NOTE — Other (Signed)
IDR Summary      Patient: Kristina Baker MRN: [id number]    Age: 25 y.o.  DOB: 08-07-1990     Admit Diagnosis: Abdominal pain  Sepsis (HCC)  Gastroenteritis      Problems pertinent to hospital stay: r/o C. Diff    Consults:Case Management     Testing due for patient today? NO    Nutrition plan:Yes     Mobility needs: No      Lines/Tubes:   IV: YES   Needed: YES  Foley: NO  Needed:NO  Central Line: NO Needed: NO      VTE Prophylaxis: Chemical            Care Management:  Discharge plan: Home    PCP: Not On File [facility]    Life Coach: NO  Financial concerns:No   Interventions:       LOS: 1 days     Expected days until discharge: 2 days        Signed:     Talbot Grumbling, FNP-BC  [facility]  Hospitalist Division  Pager:  [phone]  Office:  [phone]

## 2015-09-24 NOTE — Discharge Instructions (Signed)
Follow up appointment scheduled with Dr. Pilar GrammesAlband on 10/03/15 @ 12:30pm.

## 2015-09-24 NOTE — Progress Notes (Signed)
Problem: Falls - Risk of  Goal: *Absence of Falls  Document Schmid Fall Risk and appropriate interventions in the flowsheet.   Outcome: Progressing Towards Goal  Fall Risk Interventions:

## 2015-09-24 NOTE — Progress Notes (Signed)
Patient has designated ____________mother____________ to participate in his/her discharge plan and to receive any needed information.     Name: karen Hudspeth  Address:  Phone number:C# [phone]

## 2015-09-24 NOTE — Other (Signed)
Report given to Star RN

## 2015-09-24 NOTE — Progress Notes (Signed)
Problem: Discharge Planning  Goal: *Discharge to safe environment  Outcome: Progressing Towards Goal  home

## 2015-09-24 NOTE — Other (Signed)
Plan:  To provide an enjoyable diversion.  Implementation:  Provided live bedside harp music, Celtic selections per patient request.  Evaluation: Patient was chatting with visitors during music time, states thanks for music.

## 2015-09-24 NOTE — Progress Notes (Signed)
Nutrition initial assessment/Plan of care      RECOMMENDATIONS:   1. Full liquid Low Lactose diet. Advance per patient tolerance  2. Monitor weight and PO intake  3. RD to follow     GOALS:   1. PO intake meets >75% of protein/calorie needs by 9/3  2. Weight Maintenance/Gradual weight loss (1-2 lb) by 9/5        ASSESSMENT:   Per BMI of 36.2, weight is in the obesity classification. PO intake is Nutrition recommendations listed. RD to follow.     Nutrition Diagnoses:   Inadequate oral intake related to nausea/vomiting and diarrhea as evidenced by reported po intake in the last 2 days.    Nutrition Risk:   High   Moderate   Low    SUBJECTIVE/OBJECTIVE:   Patient admitted for sepsis and gastroenteritis. Patient states that she has nausea and diarrhea. She just woke up and didn't eat any of her full liquid lunch yet. She denies any recent weight loss and thinks that her weight might be around 225 lb. She states that she is lactose intolerant.      Information Obtained from:     Chart Review    Patient    Family/Caregiver    Nurse/Physician    Interdisciplinary Meeting/Rounds    Diet: Full liquid  Medications:  Reviewed (0.9%NaCl-KCl4520meq/L: 100 ml/hr)   Allergies:  Reviewed (Lactose intolerance)  Encounter Diagnoses     ICD-10-CM ICD-9-CM   1. Sepsis, due to unspecified organism (HCC) A41.9 038.9     995.91   2. Non-intractable vomiting with nausea, unspecified vomiting type R11.2 787.01     Past Medical History:   Diagnosis Date   ??? ADD (attention deficit disorder)    ??? Anxiety    ??? Depressed       Labs:    Lab Results   Component Value Date/Time    Sodium 138 09/24/2015 04:15 AM    Potassium 4.0 09/24/2015 04:15 AM    Chloride 106 09/24/2015 04:15 AM    CO2 23 09/24/2015 04:15 AM    Anion gap 9 09/24/2015 04:15 AM    Glucose 98 09/24/2015 04:15 AM    BUN 10 09/24/2015 04:15 AM    Creatinine 0.74 09/24/2015 04:15 AM    Calcium 7.5 09/24/2015 04:15 AM    Albumin 3.0 09/24/2015 04:15 AM      Anthropometrics: BMI (calculated): 36.2  Last 3 Recorded Weights in this Encounter    09/23/15 1739   Weight: 101.6 kg (224 lb)      Ht Readings from Last 1 Encounters:   09/23/15 5\' 6"  (1.676 m)       IBW: 130 lb %IBW: 172% UBW: 225 lb %UBW: 100%    Weight Loss  Weight Gain  Weight Stable    Estimated Nutrition Needs:  MSJ   Other:  Calories: 1800-2300 kcal Based on:    Actual BW    Protein:   70-90 g Based on:    IBW    Fluid:       2000-3000 ml Based on:    Actual BW       No Cultural, religious or ethnic dietary need identified.     Cultural, religious and ethnic food preferences identified and addressed     Wt Status:   Normal (18.6 - 24.9)  Underweight (<18.5)  Overweight (25 - 29.9)  Mild Obesity (30 - 34.9)   Moderate Obesity (35 - 39.9)  Morbid Obesity (40+)    Moderate  Malnutrition  Severe Malnutrition in the context of :     Nutrition Problems Identified:    Suboptimal PO intake    Food Allergies   Difficulty chewing/swallowing/poor dentition   Diarrhea    Nausea/Vomiting    None   Other:     Plan:    Therapeutic Diet    Obtained/adjusted food preferences/tolerances and/or snacks options     Supplements added    Occupational therapy following for feeding techniques    HS snack added     Modify diet texture     Modify diet for food allergies     Educate patient     Assist with menu selection     Monitor PO intake on meal rounds     Continue inpatient monitoring and intervention     Participated in discharge planning/Interdisciplinary rounds/Team meetings     Other:     Education Needs:    Not appropriate for teaching at this time due to:    Identified and addressed    Nutrition Monitoring and Evaluation:   Continue ongoing monitoring and intervention   Other    Donzetta MattersVeronique Pettitt, RD  Pager: [phone]

## 2015-09-24 NOTE — Progress Notes (Incomplete)
Patient received awake,alert and oriented.Resting comfortably,Mother at bedside,no complaints.  Stool specimen sent to lab,resting comfortably,medicated for pain  Resting comfortably,no complaints,good pain relief.  Called lab,they are working on c.diff results.  Good relief from analgesia.

## 2015-09-25 LAB — CBC WITH AUTOMATED DIFF
ABS. BASOPHILS: 0 10*3/uL (ref 0.0–0.06)
ABS. EOSINOPHILS: 0.1 10*3/uL (ref 0.0–0.4)
ABS. LYMPHOCYTES: 2 10*3/uL (ref 0.9–3.6)
ABS. MONOCYTES: 1.4 10*3/uL — ABNORMAL HIGH (ref 0.05–1.2)
ABS. NEUTROPHILS: 2.8 10*3/uL (ref 1.8–8.0)
BASOPHILS: 0 % (ref 0–2)
EOSINOPHILS: 2 % (ref 0–5)
HCT: 32.2 % — ABNORMAL LOW (ref 35.0–45.0)
HGB: 10.7 g/dL — ABNORMAL LOW (ref 12.0–16.0)
LYMPHOCYTES: 32 % (ref 21–52)
MCH: 30.8 PG (ref 24.0–34.0)
MCHC: 33.2 g/dL (ref 31.0–37.0)
MCV: 92.8 FL (ref 74.0–97.0)
MONOCYTES: 23 % — ABNORMAL HIGH (ref 3–10)
MPV: 9.9 FL (ref 9.2–11.8)
NEUTROPHILS: 43 % (ref 40–73)
PLATELET: 212 10*3/uL (ref 135–420)
RBC: 3.47 M/uL — ABNORMAL LOW (ref 4.20–5.30)
RDW: 12.5 % (ref 11.6–14.5)
WBC: 6.3 10*3/uL (ref 4.6–13.2)

## 2015-09-25 LAB — C. DIFFICILE/EPI PCR

## 2015-09-25 LAB — VANCOMYCIN, TROUGH
Reported dose date: 20170830
Reported dose time:: 0
Reported dose:: 1000 UNITS
Vancomycin,trough: 10.3 ug/mL (ref 10.0–20.0)

## 2015-09-25 MED ORDER — VENLAFAXINE SR 75 MG 24 HR CAP
75 mg | ORAL | Status: AC
Start: 2015-09-25 — End: 2015-09-25
  Administered 2015-09-25: 19:00:00 via ORAL

## 2015-09-25 MED ORDER — PIPERACILLIN-TAZOBACTAM 3.375 GRAM IV SOLR
3.375 gram | Freq: Three times a day (TID) | INTRAVENOUS | Status: DC
Start: 2015-09-25 — End: 2015-09-25

## 2015-09-25 MED ORDER — PHARMACY VANCOMYCIN NOTE
Freq: Once | Status: DC
Start: 2015-09-25 — End: 2015-09-25

## 2015-09-25 MED ORDER — LOPERAMIDE 2 MG CAP
2 mg | Freq: Three times a day (TID) | ORAL | Status: DC | PRN
Start: 2015-09-25 — End: 2015-09-26
  Administered 2015-09-25 – 2015-09-26 (×4): via ORAL

## 2015-09-25 MED ORDER — SODIUM CHLORIDE 0.9 % IV
10 gram | Freq: Three times a day (TID) | INTRAVENOUS | Status: DC
Start: 2015-09-25 — End: 2015-09-25

## 2015-09-25 MED ORDER — VENLAFAXINE SR 75 MG 24 HR CAP
75 mg | Freq: Every day | ORAL | Status: DC
Start: 2015-09-25 — End: 2015-09-26
  Administered 2015-09-26: 14:00:00 via ORAL

## 2015-09-25 MED FILL — VENLAFAXINE SR 75 MG 24 HR CAP: 75 mg | ORAL | Qty: 1

## 2015-09-25 MED FILL — VANCOMYCIN 10 GRAM IV SOLR: 10 gram | INTRAVENOUS | Qty: 1750

## 2015-09-25 MED FILL — TRAZODONE 50 MG TAB: 50 mg | ORAL | Qty: 1

## 2015-09-25 MED FILL — ZOSYN 3.375 GRAM INTRAVENOUS SOLUTION: 3.375 gram | INTRAVENOUS | Qty: 3.38

## 2015-09-25 MED FILL — NS WITH POTASSIUM CHLORIDE 20 MEQ/L IV: 20 mEq/L | INTRAVENOUS | Qty: 1000

## 2015-09-25 MED FILL — MORPHINE 2 MG/ML INJECTION: 2 mg/mL | INTRAMUSCULAR | Qty: 1

## 2015-09-25 MED FILL — ONDANSETRON (PF) 4 MG/2 ML INJECTION: 4 mg/2 mL | INTRAMUSCULAR | Qty: 2

## 2015-09-25 MED FILL — PHARMACY VANCOMYCIN NOTE: Qty: 1

## 2015-09-25 MED FILL — LOPERAMIDE 2 MG CAP: 2 mg | ORAL | Qty: 1

## 2015-09-25 MED FILL — VANCOMYCIN 1,000 MG IV SOLR: 1000 mg | INTRAVENOUS | Qty: 1000

## 2015-09-25 MED FILL — LOVENOX 40 MG/0.4 ML SUBCUTANEOUS SYRINGE: 40 mg/0.4 mL | SUBCUTANEOUS | Qty: 0.4

## 2015-09-25 MED FILL — BUPROPION 75 MG TAB: 75 mg | ORAL | Qty: 1

## 2015-09-25 NOTE — Progress Notes (Signed)
Problem: Falls - Risk of  Goal: *Absence of Falls  Document Schmid Fall Risk and appropriate interventions in the flowsheet.   Outcome: Progressing Towards Goal  Fall Risk Interventions:              Medication Interventions: Bed/chair exit alarm

## 2015-09-25 NOTE — Progress Notes (Signed)
Problem: Falls - Risk of  Goal: *Absence of Falls  Document Schmid Fall Risk and appropriate interventions in the flowsheet.   Outcome: Progressing Towards Goal  Fall Risk Interventions:              Medication Interventions: Patient to call before getting OOB

## 2015-09-25 NOTE — Progress Notes (Addendum)
1915: Assumed patient care. Received report from [place], Charity fundraiserN. Report included SBAR, Kardex, and MAR. Patient denies pain and is in no signs of distress. Bed in lowest position. Family at bedside. Call light and possessions within reach.     0745: Bedside and Verbal shift change report given to Victorino DikeJennifer, RN and Irving BurtonEmily, RN (oncoming nurse) by Irving BurtonEmily, RN (offgoing nurse). Report included the following information SBAR, Kardex and MAR.

## 2015-09-25 NOTE — Progress Notes (Addendum)
Patient received at beginning of shift from Star RN, A&Ox4, in bed, awake. Pt does not appear to be in distress and denies any pain and/or discomfort. Safety measures take include bed in lowest locked position, call bell within reach, and personal items at bedside within reach. Pt verbalizes understanding of use of call bell and the need to call for assistance to ensure safety. Mother at bedside.      1915: Bedside and Verbal shift change report given to Irving BurtonEmily RN and Geophysicist/field seismologistLorie RN (oncoming nurse) by Marcelo BaldySareena Khosla, RN (offgoing nurse). Report included the following information SBAR, Kardex, Intake/Output, MAR and Recent Results.

## 2015-09-25 NOTE — Progress Notes (Signed)
[facility]  Hospitalist Division        Inpatient Daily Progress Note    Daily progress Note    Patient: Kristina Baker MRN: [id number]  CSN: [id number]    Date of Birth: Mar 28, 1990  Age: 25 y.o.  Sex: female    DOA: 09/23/2015 LOS:  LOS: 2 days                    Chief Complaint:  Diarrhea       Subjective:      Multiple episodes of diarrhea throughout the day; incontinent as well. Denies fever, chills, nausea or vomiting.     Objective:      Visit Vitals   ??? BP 111/75 (BP 1 Location: Left arm, BP Patient Position: At rest;Supine)   ??? Pulse 79   ??? Temp 98 ??F (36.7 ??C)   ??? Resp 16   ??? Ht 5\' 6"  (1.676 m)   ??? Wt 107.7 kg (237 lb 6.4 oz)   ??? SpO2 95%   ??? Breastfeeding No   ??? BMI 38.32 kg/m2         Physical Exam:  General appearance: alert, cooperative, no distress, appears stated age  Lungs: clear to auscultation bilaterally, no wheezes or rhonchi   Heart: regular rate and rhythm, S1, S2 normal, no murmur, click, rub or gallop  Abdomen: soft, non tender, non distended. Normoactive bowel sounds  Extremities: extremities normal, atraumatic, no cyanosis or edema  Skin: Skin color, texture, turgor normal. No rashes or lesions  Neurologic: Grossly normal  PSY: Mood and affect normal, appropriately behaved        Intake and Output:  Current Shift:     Last three shifts:  08/28 1901 - 08/30 0700  In: 5211.7 [P.O.:1160; I.V.:4051.7]  Out: 401     Recent Results (from the past 24 hour(s))   CBC WITH AUTOMATED DIFF    Collection Time: 09/25/15  4:20 AM   Result Value Ref Range    WBC 6.3 4.6 - 13.2 K/uL    RBC 3.47 (L) 4.20 - 5.30 M/uL    HGB 10.7 (L) 12.0 - 16.0 g/dL    HCT 95.6 (L) 21.3 - 45.0 %    MCV 92.8 74.0 - 97.0 FL    MCH 30.8 24.0 - 34.0 PG    MCHC 33.2 31.0 - 37.0 g/dL    RDW 08.6 57.8 - 46.9 %    PLATELET 212 135 - 420 K/uL    MPV 9.9 9.2 - 11.8 FL    NEUTROPHILS 43 40 - 73 %    LYMPHOCYTES 32 21 - 52 %    MONOCYTES 23 (H) 3 - 10 %    EOSINOPHILS 2 0 - 5 %    BASOPHILS 0 0 - 2 %     ABS. NEUTROPHILS 2.8 1.8 - 8.0 K/UL    ABS. LYMPHOCYTES 2.0 0.9 - 3.6 K/UL    ABS. MONOCYTES 1.4 (H) 0.05 - 1.2 K/UL    ABS. EOSINOPHILS 0.1 0.0 - 0.4 K/UL    ABS. BASOPHILS 0.0 0.0 - 0.06 K/UL    DF AUTOMATED     VANCOMYCIN, TROUGH    Collection Time: 09/25/15  7:45 AM   Result Value Ref Range    Vancomycin,trough 10.3 10.0 - 20.0 ug/mL    Reported dose date: [id number]      Reported dose time: 0      Reported dose: 1,000 MG UNITS  Lab Results   Component Value Date/Time    Glucose 98 09/24/2015 04:15 AM    Glucose 143 09/23/2015 05:55 PM        Assessment/Plan:     Patient Active Problem List   Diagnosis Code   ??? Abdominal pain R10.9   ??? Sepsis (HCC) A41.9   ??? Gastroenteritis K52.9   ??? Insomnia G47.00   ??? Anxiety F41.9   ??? Depression F32.9   ??? Hepatic steatosis K76.0   ??? Lactic acidosis E87.2       A/P:  ? Enteritis vs IBS- C. Diff- negative. Discontinue antibiotics. Consult GI  Sepsis 2/2 above: resolved   IBS Hx  Anxiety/Depression- Effexor   Insomnia- Trazodone  DVT prophylaxis: Lovenox         Talbot Grumbling, FNP-BC  [facility]  Hospitalist Division  Pager:  [phone]  Office:  [phone]

## 2015-09-25 NOTE — Consults (Signed)
Gastroenterology Consult    Patient: Kristina Baker MRN: [id number]  SSN: [id number]    Date of Birth: 08/05/1990  Age: 25 y.o.  Sex: female        Assessment:   1.  Acute viral or bacterial toxin-induced gastroenteritis.  Stool CS negative. C.difficile PCR negative.  Clinically improved.  2.  Fatty liver disease with elevated LFTs.  CT showing steatosis.  3.  Hx of IBS-D.  4.  Hx of cholecystectomy.    Plan:   1.  Instructed patient to go on a BRAT diet.  Avoid greasy food and dairy for the next 2 weeks.  2.  She may take Imodium sparingly and judiciously. Prefer she stay well hydrated and use Gatorade or oral rehydration solution to replace diarrheal loses.  If Imodium no effective then can use Questran.  3.  She can be discharged tomorrow provided she is able to ambulate safely and tolerate her meals.  4.  Recommend outpatient follow-up with Dr. Janeann Forehand in 4 weeks.  Thank you for the consult request.    Chief Complaint:   Chief Complaint   Patient presents with   ??? Abdominal Pain   ??? Vomiting   ??? Diarrhea   ??? 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  This began several hours after eating a chicken parmesan sandwich purchased from a convenient store 4 days ago.  No fever, hematemesis or rectal bleeding.  She became quite dizzy, weak and incontinent of stools yesterday prompting ED evaluation and admission.  CT on admission did not show actual inflammation of the GI tract but showed a large amount of intraluminal fluid c/w enteritis.  Her stool CS and C.difficile toxin were negative.  She has seen Dr. Janeann Forehand in the past who has performed and EGD and Colonoscopy and has diagnosed her as having IBS-D.  In 2011 she underwent a laparoscopic cholecystectomy in [place] for symptomatic gallstones.    Hospital Problems  Never Reviewed           Codes Class Noted POA    Abdominal pain ICD-10-CM: R10.9  ICD-9-CM: 789.00  09/23/2015 Yes        Sepsis (HCC) ICD-10-CM: A41.9  ICD-9-CM: 038.9, 995.91  09/23/2015 Yes        * (Principal)Gastroenteritis ICD-10-CM: K52.9  ICD-9-CM: 558.9  09/23/2015 Unknown        Insomnia ICD-10-CM: G47.00  ICD-9-CM: 780.52  09/23/2015 Yes        Anxiety ICD-10-CM: F41.9  ICD-9-CM: 300.00  09/23/2015 Yes        Depression ICD-10-CM: F32.9  ICD-9-CM: 311  09/23/2015 Yes        Hepatic steatosis ICD-10-CM: K76.0  ICD-9-CM: 571.8  09/23/2015 Yes        Lactic acidosis ICD-10-CM: E87.2  ICD-9-CM: 276.2  09/23/2015 Yes            Past Medical History:   Diagnosis Date   ??? ADD (attention deficit disorder)    ??? Anxiety    ??? Depressed      No past surgical history on file.   No family history on file.  Social History   Substance Use Topics   ??? Smoking status: Never Smoker   ??? Smokeless tobacco: Not on file   ??? Alcohol use Yes  Comment: occ      Current Facility-Administered Medications   Medication Dose Route Frequency Provider Last Rate Last Dose   ??? loperamide (IMODIUM) capsule 2 mg  2 mg Oral TID PRN Franne FortsPatrick B Higdon, MD   2 mg at 09/25/15 1123   ??? [START ON 09/26/2015] venlafaxine-SR (EFFEXOR-XR) capsule 150 mg  150 mg Oral DAILY Jillyn LedgerKristi N Meibers, NP       ??? ondansetron (ZOFRAN) injection 4 mg  4 mg IntraVENous Q4H PRN Zorita PangIfeanyichukwu G Chukwuma, MD   4 mg at 09/25/15 0640   ??? sodium chloride (NS) flush 5-10 mL  5-10 mL IntraVENous PRN Denny Levyarl D Wright, PA       ??? 0.9% sodium chloride with KCl 20 mEq/L infusion   IntraVENous CONTINUOUS Zorita PangIfeanyichukwu G Chukwuma, MD 100 mL/hr at 09/25/15 0744     ??? enoxaparin (LOVENOX) injection 40 mg  40 mg SubCUTAneous Q24H Zorita PangIfeanyichukwu G Chukwuma, MD   40 mg at 09/24/15 2244   ??? morphine injection 2 mg  2 mg IntraVENous Q4H PRN Zorita PangIfeanyichukwu G Chukwuma, MD   2 mg at 09/25/15 1131   ??? naloxone (NARCAN) injection 0.4 mg  0.4 mg IntraVENous PRN Zorita PangIfeanyichukwu G Chukwuma, MD        ??? buPROPion (WELLBUTRIN) tablet 75 mg  75 mg Oral BID Zorita PangIfeanyichukwu G Chukwuma, MD   75 mg at 09/25/15 1722   ??? traZODone (DESYREL) tablet 50 mg  50 mg Oral QHS Zorita PangIfeanyichukwu G Chukwuma, MD   50 mg at 09/24/15 2243        Allergies   Allergen Reactions   ??? Lactose Itching       Review of Systems:  A comprehensive review of systems was negative except for that written in the History of Present Illness.    Objective:     Patient Vitals for the past 24 hrs:   Temp Pulse Resp BP SpO2   09/25/15 1801 98.2 ??F (36.8 ??C) 73 14 113/76 97 %   09/25/15 0542 98 ??F (36.7 ??C) 79 16 111/75 95 %         Intake/Output Summary (Last 24 hours) at 09/25/15 1841  Last data filed at 09/25/15 0600   Gross per 24 hour   Intake             1690 ml   Output                0 ml   Net             1690 ml       Physical Exam:  Awake, alert, oriented.  Anicteric sclerae, pink conjunctivae.  Moist oral mucosa.  Supple neck.  No JVD. No adenopathy. Normal thyroid.  Clear breath sounds.  RRR, no murmurs.  Abdomen soft, normal BS, nontender, no mass, no organomegaly, no ascites.  Rectal exam not done.  Warm extremities,  2+ pulses, no edema.    Laboratory Results:    Labs: Results:   Chemistry Recent Labs      09/24/15   0415  09/23/15   1755   GLU  98  143*   NA  138  137   K  4.0  4.6   CL  106  103   CO2  23  21   BUN  10  15   CREA  0.74  0.99   CA  7.5*  9.4   AGAP  9  13   BUCR  14  15  AP  57  83   TP  6.2*  8.5*   ALB  3.0*  3.9   GLOB  3.2  4.6*   AGRAT  0.9  0.8    Estimated Creatinine Clearance: 141.9 mL/min (based on Cr of 0.74).   CBC w/Diff Recent Labs      09/25/15   0420  09/24/15   0415  09/23/15   1755   WBC  6.3  10.7  18.8*   RBC  3.47*  3.90*  4.87   HGB  10.7*  12.2  15.2   HCT  32.2*  36.5  44.9   PLT  212  255  348   GRANS  43  80*  93*   LYMPH  32  11*  3*   EOS  2  0  0      Cardiac Enzymes Recent Labs      09/23/15   1755   CPK  67      Coagulation Recent Labs      09/24/15   0415   PTP  14.4   INR  1.2        Hepatitis Panel No results found for: HAMAT, HAAB, HABT, HAAT, HBSAG, HBSB, HBSAT, HBABN, HBCM, HBCAB, HBCAT, XBCABS, HBEAB, HBEAG, XHEPCS, [id number], HBEGLT, HBCMLT, HBCLT, HBEBLT, [id number], [id number], HAVMLT, [id number], HBCMLT, [id number], HCGAT   Amylase Lipase    Liver Enzymes Recent Labs      09/24/15   0415   TP  6.2*   ALB  3.0*   AP  57   SGOT  38*   ALT  97*      Thyroid Studies No results for input(s): T4, T3U, TSH, TSHEXT in the last 72 hours.    No lab exists for component: T3RU     Pathology pathology         Signed By: Perlie GoldFelix P. Leonard Downingiongco, MD, FACP    September 25, 2015

## 2015-09-25 NOTE — Progress Notes (Signed)
Pharmacy Dosing Services: Vancomycin    Indication: sepsis    Day of therapy: 2    Other Antimicrobials (Include dose, start day & day of therapy):  Zosyn: Changed from 4.5g q6h IV to 3.375 gm IV q8h extended 4 hours infusion.      Loading dose (date given): 2.5g IV x1  Current Maintenance dose: 1g IV q8h    Goal Vancomycin Level: 15-20  (Trough 15-20 for most infections, 20 for meningitis/osteomyelitis, pre-HD level ~25)    Vancomycin Level (if drawn): 10.3     Significant Cultures:   Blood: neg, C.Diff: neg.    Renal function stable? (unstable defined as SCr increase of 0.5 mg/dL or > 86%50% increase from baseline, whichever is greater) (Y/N): Y     CAPD, Hemodialysis or Renal Replacement Therapy (Y/N): N     Recent Labs      09/25/15   0420  09/24/15   0415  09/23/15   1755   CREA   --   0.74  0.99   BUN   --   10  15   WBC  6.3  10.7  18.8*     Temp (24hrs), Avg:98 ??F (36.7 ??C), Min:98 ??F (36.7 ??C), Max:98 ??F (36.7 ??C)    Creatinine Clearance (Creatinine Clearance (ml/min)): 141.9     Regimen assessment: subtherapeutic, Scr/CrCl not indicative of true renal function  Maintenance dose: increase to 1.75 g iv q8h  Next scheduled level: 9/1, 0300       Pharmacy will follow daily and adjust medications as appropriate for renal function and/or serum levels.    Thank you,  Joylene Draftavid Li

## 2015-09-25 NOTE — Progress Notes (Signed)
Chaplain conducted an initial consultation and Spiritual Assessment for Kristina Baker, who is a 25 y.o.,female. Patient???s Primary Language is: [place].   According to the patient???s EMR Religious Affiliation is: Non denominational.     The reason the Patient came to the hospital is:   Patient Active Problem List    Diagnosis Date Noted   ??? Abdominal pain 09/23/2015   ??? Sepsis (HCC) 09/23/2015   ??? Gastroenteritis 09/23/2015   ??? Insomnia 09/23/2015   ??? Anxiety 09/23/2015   ??? Depression 09/23/2015   ??? Hepatic steatosis 09/23/2015   ??? Lactic acidosis 09/23/2015        The Chaplain provided the following Interventions:  Initiated a relationship of care and support with patient in room 2101 this morning around 0907.  Listened empathically as patient talked about being here and having some test run.  Family member was also present.  Patient is hopeful for a short stay with [place].  Provided information about Spiritual Care Services.  Offered prayer and assurance of continued prayers on patients behalf.       The following outcomes were achieved:  Patient shared limited information about her medical narrative and spiritual journey/beliefs.  Patient processed feeling about current hospitalization.  Patient expressed gratitude for pastoral care visit.    Assessment:  Patient does not have any religious/cultural needs that will affect patient???s preferences in health care.  There are no further spiritual or religious issues which require Spiritual Care Services interventions at this time.       Plan:  Chaplains will continue to follow and will provide pastoral care on an as needed/requested basis    .Dewaine Oats   Spiritual Care   [phone]

## 2015-09-25 NOTE — Other (Signed)
?? Patient Demographics  ??   ?? Patient Name [place] Sex DOB Address Phone ??   ?? Kristina Baker, [place] [phone] Female 01-12-1991 [address]  [place] [phone] (Home) ??   ??   ?? CSN: ??   ?? [id number] ??   ??   ?? Admit Date: Admit Time Room Bed ??   ?? Sep 23, 2015 ??5:41 PM 2101 [[id number]] 01 [[id number]] ??   ??   ?? Attending Providers  ??   ?? Provider Pager From To ??   ?? Meredith Pel, MD  09/23/15 09/23/15 ??   ?? Threasa Beards, MD  09/23/15 09/23/15 ??   ?? Joylene Igo, MD  09/23/15 09/24/15 ??   ?? Elmo Putt, MD  09/24/15  ??   ??   ?? Emergency Contact(s)  ??   ?? Name Relation Home Work Mobile ??   ?? Judyann, [place] Parent [phone]   ??   ?? Magdelene, Ruark    [phone] ??   ??   Utilization Review         ?? Late entry of GI consult from 8/30. by Cy Blamer  ??   ?? Review Status Review Entered ??   ?? In Primary 09/26/2015 ??   ?? Details ??   ?? ?? GI CONSULT 8/30;   Assessment:   1. ??Acute viral or bacterial toxin-induced gastroenteritis. ??Stool CS negative. C.difficile PCR negative. ??Clinically improved.  2. ??Fatty liver disease with elevated LFTs. ??CT showing steatosis.  3. ??Hx of IBS-D.  4. ??Hx of cholecystectomy.  ??  Plan:   1. ??Instructed patient to go on a BRAT diet. ??Avoid greasy food and dairy for the next 2 weeks.  2. ??She may take Imodium sparingly and judiciously. Prefer she stay well hydrated and use Gatorade or oral rehydration solution to replace diarrheal loses. ??If Imodium no effective then can use Questran.  3. ??She can be discharged tomorrow provided she is able to ambulate safely and tolerate her meals.  4. ??Recommend outpatient follow-up with Dr. Emeline General in 4 weeks. ??Thank you for the consult request.  ??  Chief Complaint:   ?? ??   Chief Complaint   Patient presents with   ??? Abdominal Pain   ??? Vomiting   ??? Diarrhea   ??? Dizziness   ??  ??  Subjective:   ??  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 ??This began several hours after eating a chicken parmesan sandwich purchased from a convenient store 4 days ago. ??No fever, hematemesis or rectal bleeding. ??She became quite dizzy, weak and incontinent of stools yesterday prompting ED evaluation and admission. ??CT on admission did not show actual inflammation of the GI tract but showed a large amount of intraluminal fluid c/w enteritis. ??Her stool CS and C.difficile toxin were negative. ??She has seen Dr. Emeline General in the past who has performed and EGD and Colonoscopy and has diagnosed her as having IBS-D. ??In 2011 she underwent a laparoscopic cholecystectomy in [place] for symptomatic gallstones.   ??      ??   ?? Gastroenterology [facility] - Care Day 3 (09/25/2015) by Cy Blamer  ??   ?? Review Status Review Entered ??   ?? Completed 09/25/2015 ??   ?? Details ??   ??  ??   ?? Care Day: 3 Care Date: 09/25/2015 Level of Care: Inpatient  Floor ??   ?? Guideline Day 3  ??   ?? Level Of Care ??   ?? (X) * Activity level acceptable ??   ?? ( ) * Complete discharge planning ??   ??  ??   ?? Clinical Status ??   ?? (X) * Hemodynamic stability ??   ?? 09/25/2015 4:14 PM EDT by Harriett Sine ??   ?? ?? 98 ????F (36.7 ????C) 79 . 111/75 -- At rest;Supine r 16 sat 95 % ??   ??  ??   ?? (X) * Abdominal status acceptable ??   ?? 09/25/2015 4:14 PM EDT by Harriett Sine ??   ?? ?? Denies fever, chills, nausea or vomiting. ?? Abdomen: soft, non tender, non distended. Normoactive bowel sounds ??   ??  ??   ?? ( ) * Pain and nausea absent or adequately managed ??   ?? 09/25/2015 4:14 PM EDT by Harriett Sine ??   ?? ?? IV pain meds. ??   ??  ??   ?? (X) * Temperature status acceptable ??   ?? ( ) * Intestinal status acceptable ??   ?? 09/25/2015 4:14 PM EDT by Harriett Sine ??   ?? ?? Multiple episodes of diarrhea throughout the day; incontinent as well. ??   ??  ??   ?? (X) * Hepatic and biliary abnormalities absent or acceptable ??   ?? ( ) * General Discharge Criteria met ??   ??  ??   ?? Interventions ??    ?? ( ) * No inpatient interventions needed ??   ?? 09/25/2015 4:14 PM EDT by Harriett Sine ??   ?? ?? MEDS; ns IV with 20 K at 100/h, wellbutrin po , MEDS; lovenox sq q 24h, ??imodium po x 1 ??today so far, ??Morphine iv x 5 over 24h, zofran iv x 4 over 24h, ??desyrel po q hs, effexor po , zosyn iv x 1, Vanc iv x 1, ??   ??  ??   ?? (X) * Intake acceptable ??   ?? 09/25/2015 4:14 PM EDT by Harriett Sine ??   ?? ?? PO INTAKE 1,160 over 24h. ??Diet order is Full Liquid. ??   ??  ??   ??  ??   ?? 09/25/2015 4:14 PM EDT by Harriett Sine ??   ?? Subject: Additional Clinical Information ??   ?? LAB; wbc 6.3, rbc 3.47, hh 10.7/ 32.2. -------------  PER MEDICINE TEAM; ?? ?? A/P:  ? Enteritis vs IBS- C. Diff- negative. Discontinue antibiotics. Consult GI...  Sepsis 2/2 above: resolved ...  IBS Hx.Marland Kitchen  Anxiety/Depression- Effexor ...  Insomnia- Trazodone...  DVT prophylaxis: Lovenox . ??   ??  ??   ??  ??   ??  ??   ??  ??   ?? * Milestone ??   ??  ??   ?? Additional Notes ??   ?? GI consult, i&o, neuro checks.up ad lib

## 2015-09-25 NOTE — Consults (Signed)
Consults  by Robby Sermon, MD at 09/25/15 1841                Author: Robby Sermon, MD  Service: Gastroenterology  Author Type: Physician       Filed: 09/25/15 1851  Date of Service: 09/25/15 1841  Status: Signed          Editor: Robby Sermon, MD (Physician)            Consult Orders        1. IP CONSULT TO GASTROENTEROLOGY [[id number]] ordered by Jillyn Ledger, NP at 09/25/15 1019                                         Gastroenterology Consult          Patient: Kristina Baker  MRN: [id number]   SSN: [id number]          Date of Birth: 1990/11/22   Age: 25 y.o.   Sex: female              Assessment:     1.  Acute viral or bacterial toxin-induced gastroenteritis.  Stool CS negative. C.difficile PCR negative.  Clinically improved.   2.  Fatty liver disease with elevated LFTs.  CT showing steatosis.   3.  Hx of IBS-D.   4.  Hx of cholecystectomy.        Plan:     1.  Instructed patient to go on a BRAT diet.  Avoid greasy food and dairy for the next 2 weeks.   2.  She may take Imodium sparingly and judiciously. Prefer she stay well hydrated and use Gatorade or oral rehydration solution to replace diarrheal loses.  If Imodium no effective then can use Questran.   3.  She can be discharged tomorrow provided she is able to ambulate safely and tolerate her meals.   4.  Recommend outpatient follow-up with Dr. Janeann Forehand in 4 weeks.  Thank you for the consult request.      Chief Complaint:      Chief Complaint       Patient presents with        ?  Abdominal Pain     ?  Vomiting     ?  Diarrhea        ?  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  This began several  hours after eating a chicken parmesan sandwich purchased from a convenient store 4 days ago.  No fever, hematemesis or rectal bleeding.  She became quite dizzy, weak and  incontinent of stools yesterday prompting ED evaluation and admission.  CT on admission  did not show actual inflammation of the GI tract but showed a large amount of intraluminal fluid c/w enteritis.  Her stool CS and C.difficile toxin were negative.  She has seen Dr. Janeann Forehand in the past who has performed and EGD and Colonoscopy and has  diagnosed her as having IBS-D.  In 2011 she underwent a laparoscopic cholecystectomy in [place] for symptomatic gallstones.         Hospital Problems   Never Reviewed  Codes  Class  Noted  POA              Abdominal pain  ICD-10-CM: R10.9   ICD-9-CM: 789.00    09/23/2015  Yes                        Sepsis (HCC)  ICD-10-CM: A41.9   ICD-9-CM: 038.9, 995.91    09/23/2015  Yes                        * (Principal)Gastroenteritis  ICD-10-CM: K52.9   ICD-9-CM: 558.9    09/23/2015  Unknown                        Insomnia  ICD-10-CM: G47.00   ICD-9-CM: 780.52    09/23/2015  Yes                        Anxiety  ICD-10-CM: F41.9   ICD-9-CM: 300.00    09/23/2015  Yes                        Depression  ICD-10-CM: F32.9   ICD-9-CM: 311    09/23/2015  Yes                        Hepatic steatosis  ICD-10-CM: K76.0   ICD-9-CM: 571.8    09/23/2015  Yes                        Lactic acidosis  ICD-10-CM: E87.2   ICD-9-CM: 276.2    09/23/2015  Yes                         Past Medical History:        Diagnosis  Date         ?  ADD (attention deficit disorder)       ?  Anxiety           ?  Depressed          No past surgical history on file.    No family history on file.     Social History       Substance Use Topics         ?  Smoking status:  Never Smoker     ?  Smokeless tobacco:  Not on file         ?  Alcohol use  Yes                Comment: occ           Current Facility-Administered Medications             Medication  Dose  Route  Frequency  Provider  Last Rate  Last Dose              ?  loperamide (IMODIUM) capsule 2 mg   2 mg  Oral  TID PRN  Franne FortsPatrick B Higdon, MD     2 mg at 09/25/15 1123      ?  [START ON 09/26/2015] venlafaxine-SR (EFFEXOR-XR) capsule 150 mg   150 mg  Oral  DAILY  Jillyn LedgerKristi N Meibers, NP                    ?  ondansetron (ZOFRAN) injection 4 mg   4 mg  IntraVENous  Q4H PRN  Zorita Pang, MD     4 mg at 09/25/15 0640              ?  sodium chloride (NS) flush 5-10 mL   5-10 mL  IntraVENous  PRN  Denny Levy, PA           ?  0.9% sodium chloride with KCl 20 mEq/L infusion     IntraVENous  CONTINUOUS  Zorita Pang, MD  100 mL/hr at 09/25/15 0744        ?  enoxaparin (LOVENOX) injection 40 mg   40 mg  SubCUTAneous  Q24H  Zorita Pang, MD     40 mg at 09/24/15 2244     ?  morphine injection 2 mg   2 mg  IntraVENous  Q4H PRN  Zorita Pang, MD     2 mg at 09/25/15 1131     ?  naloxone (NARCAN) injection 0.4 mg   0.4 mg  IntraVENous  PRN  Zorita Pang, MD           ?  buPROPion (WELLBUTRIN) tablet 75 mg   75 mg  Oral  BID  Zorita Pang, MD     75 mg at 09/25/15 1722              ?  traZODone (DESYREL) tablet 50 mg   50 mg  Oral  QHS  Zorita Pang, MD     50 mg at 09/24/15 2243              Allergies        Allergen  Reactions         ?  Lactose  Itching           Review of Systems:   A comprehensive review of systems was negative except for that written in the History of Present Illness.        Objective:        Patient Vitals for the past 24 hrs:            Temp  Pulse  Resp  BP  SpO2     09/25/15 1801  98.2 ??F (36.8 ??C)  73  14  113/76  97 %     09/25/15 0542  98 ??F (36.7 ??C)  79  16  111/75  95 %              Intake/Output Summary (Last 24 hours) at 09/25/15 1841  Last data filed at 09/25/15 0600        Gross per 24 hour     Intake              1690 ml     Output                 0 ml     Net              1690 ml           Physical Exam:   Awake, alert, oriented.   Anicteric sclerae, pink conjunctivae.  Moist oral mucosa.   Supple neck.  No JVD. No adenopathy. Normal thyroid.   Clear breath sounds.   RRR, no  murmurs.   Abdomen soft, normal BS, nontender, no mass, no organomegaly, no ascites.   Rectal exam not done.   Warm extremities,  2+ pulses, no edema.      Laboratory Results:         Labs:  Results:        Chemistry  Recent Labs          09/24/15    0415   09/23/15    1755      GLU   98   143*      NA   138   137      K   4.0   4.6      CL   106   103      CO2   23   21      BUN   10   15      CREA   0.74   0.99      CA   7.5*   9.4      AGAP   9   13      BUCR   14   15      AP   57   83      TP   6.2*   8.5*      ALB   3.0*   3.9      GLOB   3.2   4.6*      AGRAT   0.9   0.8          Estimated Creatinine Clearance: 141.9 mL/min (based on Cr of 0.74).     CBC w/Diff  Recent Labs          09/25/15    0420   09/24/15    0415   09/23/15    1755      WBC   6.3   10.7   18.8*      RBC   3.47*   3.90*   4.87      HGB   10.7*   12.2   15.2      HCT   32.2*   36.5   44.9      PLT   212   255   348      GRANS   43   80*   93*      LYMPH   32   11*   3*      EOS   2   0   0           Cardiac Enzymes  Recent Labs          09/23/15    1755      CPK   67           Coagulation  Recent Labs          09/24/15    0415      PTP   14.4      INR   1.2               Hepatitis Panel  No results found for: HAMAT, HAAB, HABT, HAAT, HBSAG, HBSB, HBSAT, HBABN, HBCM, HBCAB, HBCAT, XBCABS, HBEAB, HBEAG, XHEPCS, 006510, HBEGLT, HBCMLT, HBCLT,  HBEBLT, [id number], [id number], HAVMLT, [id number], HBCMLT, [id number], HCGAT     Amylase Lipase          Liver Enzymes  Recent Labs          09/24/15    0415      TP   6.2*      ALB   3.0*  AP   57      SGOT   38*      ALT   97*                 Thyroid Studies  No results for input(s): T4, T3U, TSH, TSHEXT in the last 72 hours.      No lab exists for component: T3RU          Pathology  pathology                 Signed By:  Perlie Gold. Leonard Downing, MD, FACP          September 25, 2015

## 2015-09-26 LAB — CBC WITH AUTOMATED DIFF
ABS. BASOPHILS: 0 10*3/uL (ref 0.0–0.06)
ABS. EOSINOPHILS: 0.2 10*3/uL (ref 0.0–0.4)
ABS. LYMPHOCYTES: 2.7 10*3/uL (ref 0.9–3.6)
ABS. MONOCYTES: 0.8 10*3/uL (ref 0.05–1.2)
ABS. NEUTROPHILS: 1.8 10*3/uL (ref 1.8–8.0)
BASOPHILS: 1 % (ref 0–2)
EOSINOPHILS: 3 % (ref 0–5)
HCT: 33 % — ABNORMAL LOW (ref 35.0–45.0)
HGB: 11 g/dL — ABNORMAL LOW (ref 12.0–16.0)
LYMPHOCYTES: 49 % (ref 21–52)
MCH: 30.9 PG (ref 24.0–34.0)
MCHC: 33.3 g/dL (ref 31.0–37.0)
MCV: 92.7 FL (ref 74.0–97.0)
MONOCYTES: 15 % — ABNORMAL HIGH (ref 3–10)
MPV: 10.2 FL (ref 9.2–11.8)
NEUTROPHILS: 32 % — ABNORMAL LOW (ref 40–73)
PLATELET: 222 10*3/uL (ref 135–420)
RBC: 3.56 M/uL — ABNORMAL LOW (ref 4.20–5.30)
RDW: 12.4 % (ref 11.6–14.5)
WBC: 5.5 10*3/uL (ref 4.6–13.2)

## 2015-09-26 LAB — METABOLIC PANEL, BASIC
Anion gap: 9 mmol/L (ref 3.0–18)
BUN/Creatinine ratio: 9 — ABNORMAL LOW (ref 12–20)
BUN: 5 MG/DL — ABNORMAL LOW (ref 7.0–18)
CO2: 25 mmol/L (ref 21–32)
Calcium: 8.2 MG/DL — ABNORMAL LOW (ref 8.5–10.1)
Chloride: 108 mmol/L (ref 100–108)
Creatinine: 0.58 MG/DL — ABNORMAL LOW (ref 0.6–1.3)
GFR est AA: >60 mL/min/{1.73_m2} (ref >60)
GFR est non-AA: >60 mL/min/{1.73_m2} (ref >60)
Glucose: 76 mg/dL (ref 74–99)
Potassium: 3.7 mmol/L (ref 3.5–5.5)
Sodium: 142 mmol/L (ref 136–145)

## 2015-09-26 LAB — CULTURE, STOOL

## 2015-09-26 MED FILL — VENLAFAXINE SR 75 MG 24 HR CAP: 75 mg | ORAL | Qty: 2

## 2015-09-26 MED FILL — LOPERAMIDE 2 MG CAP: 2 mg | ORAL | Qty: 1

## 2015-09-26 MED FILL — TRAZODONE 50 MG TAB: 50 mg | ORAL | Qty: 1

## 2015-09-26 MED FILL — NS WITH POTASSIUM CHLORIDE 20 MEQ/L IV: 20 mEq/L | INTRAVENOUS | Qty: 1000

## 2015-09-26 MED FILL — BUPROPION 75 MG TAB: 75 mg | ORAL | Qty: 1

## 2015-09-26 MED FILL — ONDANSETRON (PF) 4 MG/2 ML INJECTION: 4 mg/2 mL | INTRAMUSCULAR | Qty: 2

## 2015-09-26 MED FILL — LOVENOX 40 MG/0.4 ML SUBCUTANEOUS SYRINGE: 40 mg/0.4 mL | SUBCUTANEOUS | Qty: 0.4

## 2015-09-26 NOTE — Discharge Summary (Signed)
[facility]    Discharge Summary    Patient: Kristina Baker MRN: [id number]  CSN: [id number]    Date of Birth: [birth year]  Age: 25 y.o.  Sex: female    DOA: 09/23/2015 LOS:  LOS: 3 days   Discharge Date: 09/26/2015     Admission Diagnoses: Abdominal pain  Sepsis (HCC)  Gastroenteritis    Discharge Diagnoses:    Problem List as of 09/26/2015  Never Reviewed          Codes Class Noted - Resolved    Abdominal pain ICD-10-CM: R10.9  ICD-9-CM: 789.00  09/23/2015 - Present        Sepsis (HCC) ICD-10-CM: A41.9  ICD-9-CM: 038.9, 995.91  09/23/2015 - Present        * (Principal)Gastroenteritis ICD-10-CM: K52.9  ICD-9-CM: 558.9  09/23/2015 - Present        Insomnia ICD-10-CM: G47.00  ICD-9-CM: 780.52  09/23/2015 - Present        Anxiety ICD-10-CM: F41.9  ICD-9-CM: 300.00  09/23/2015 - Present        Depression ICD-10-CM: F32.9  ICD-9-CM: 311  09/23/2015 - Present        Hepatic steatosis ICD-10-CM: K76.0  ICD-9-CM: 571.8  09/23/2015 - Present        Lactic acidosis ICD-10-CM: E87.2  ICD-9-CM: 276.2  09/23/2015 - Present              Discharge Condition: Stable    Discharge To: Home    Consults: Gastroenterology and [facility] Course:   Kristina Baker is a 25 y.o. female who presented to the ED with c/o abdominal pain, vomiting and diarrhea. Patient reported that she developed abdominal pain yesterday. This occurred a few hours after a dinner meal of chicken at [employer] station. The pain was diffuse and non radiating. She also had associated nausea and this morning had profuse vomiting and diarrhea. She was also diaphoretic. She denied chest pain, sob, dysuria or back pain. Today she had cramping in both legs. In the ED she was started on antibiotic for presumed sepsis. She has WBC 18.8, Lactic acid 3.6. CT abdomen suggest gastroenteritis. In ED patient was noted to have a reaction to Vancomycin and was stopped and given Benadryl. Patient was admitted for ongoing treatment. Stool samples were sent and were negative  for C. Diff as well as Aeromonas, Salmonella, Shigella and E. Coli. Patient continued with IVF and diet was advanced to GI soft and tolerated. Patient was instructed to use Imodium judiciously and to follow up with GI in 4 weeks. Patient is stable for discharge home.     Physical Exam:  General appearance: alert, cooperative, no distress, appears stated age  Lungs: clear to auscultation throughout  Heart: regular rate and rhythm, S1, S2 normal, no murmur, click, rub or gallop  Abdomen: soft, non tender, non distended. Bowel sounds normoactive   Extremities: extremities normal, atraumatic, no cyanosis or edema  Skin: Skin color, texture, turgor normal. No rashes or lesions  Neurologic: Grossly normal  PSY: Mood and affect normal, appropriately behaved    Significant Diagnostic Studies:   Recent Results (from the past 24 hour(s))   CBC WITH AUTOMATED DIFF    Collection Time: 09/26/15  4:30 AM   Result Value Ref Range    WBC 5.5 4.6 - 13.2 K/uL    RBC 3.56 (L) 4.20 - 5.30 M/uL    HGB 11.0 (L) 12.0 - 16.0 g/dL    HCT 66.4 (L) 40.3 - 45.0 %  MCV 92.7 74.0 - 97.0 FL    MCH 30.9 24.0 - 34.0 PG    MCHC 33.3 31.0 - 37.0 g/dL    RDW 29.512.4 62.111.6 - 30.814.5 %    PLATELET 222 135 - 420 K/uL    MPV 10.2 9.2 - 11.8 FL    NEUTROPHILS 32 (L) 40 - 73 %    LYMPHOCYTES 49 21 - 52 %    MONOCYTES 15 (H) 3 - 10 %    EOSINOPHILS 3 0 - 5 %    BASOPHILS 1 0 - 2 %    ABS. NEUTROPHILS 1.8 1.8 - 8.0 K/UL    ABS. LYMPHOCYTES 2.7 0.9 - 3.6 K/UL    ABS. MONOCYTES 0.8 0.05 - 1.2 K/UL    ABS. EOSINOPHILS 0.2 0.0 - 0.4 K/UL    ABS. BASOPHILS 0.0 0.0 - 0.06 K/UL    DF AUTOMATED     METABOLIC PANEL, BASIC    Collection Time: 09/26/15  4:30 AM   Result Value Ref Range    Sodium 142 136 - 145 mmol/L    Potassium 3.7 3.5 - 5.5 mmol/L    Chloride 108 100 - 108 mmol/L    CO2 25 21 - 32 mmol/L    Anion gap 9 3.0 - 18 mmol/L    Glucose 76 74 - 99 mg/dL    BUN 5 (L) 7.0 - 18 MG/DL    Creatinine 6.570.58 (L) 0.6 - 1.3 MG/DL    BUN/Creatinine ratio 9 (L) 12 - 20       GFR est AA >60 >60 ml/min/1.4473m2    GFR est non-AA >60 >60 ml/min/1.9373m2    Calcium 8.2 (L) 8.5 - 10.1 MG/DL         Discharge Medications:     Current Discharge Medication List      CONTINUE these medications which have NOT CHANGED    Details   traZODone (DESYREL) 50 mg tablet Take 50 mg by mouth nightly.      LORazepam (ATIVAN) 0.5 mg tablet Take 0.5 mg by mouth.      buPROPion (WELLBUTRIN) 75 mg tablet Take  by mouth two (2) times a day.      OTHER Birth control      venlafaxine-SR (EFFEXOR XR) 37.5 mg capsule Take 150 mg by mouth daily.      methylphenidate (RITALIN) 5 mg tablet Take 5 mg by mouth.      HYDROcodone-acetaminophen (NORCO) 5-325 mg per tablet Take 1 tablet by mouth every six (6) hours as needed for Pain.  Qty: 12 tablet, Refills: 0             Activity: Activity as tolerated    Diet: BRAT diet as directed by GI     Wound Care: None needed    Follow-up: PCP within 1 week  GI in 4 weeks     Discharge time: 40 minutes   Jillyn LedgerKristi N Deunta Beneke, NP  09/26/2015, 11:56 AM

## 2015-09-26 NOTE — Progress Notes (Signed)
Assumed patient care. Received patient awake. Respiration is even, unlabored. Patient denies any pain at this time. Bed is locked and in lowest position and call bell is within reach. Not in acute distress.

## 2015-09-26 NOTE — Progress Notes (Signed)
To Whom it May Concern,    Ms Kristina Baker was admitted to [facility] on 09/23/15.  She can return to work on Tuesday September 5.      Please call for questions, [phone]    Sincerely,        Chester Holstein

## 2015-09-26 NOTE — Progress Notes (Signed)
Care Management Interventions  PCP Verified by CM: Yes  Palliative Care Consult (Criteria: CHF and RRAT>21): No  Reason for No Palliative Care Consult: Other (see comment)  Mode of Transport at Discharge: Other (see comment)  Transition of Care Consult (CM Consult): Discharge Planning  Current Support Network: Lives Alone  Confirm Follow Up Transport: Family  Plan discussed with Pt/Family/Caregiver: Yes  Discharge Location  Discharge Placement: Home

## 2015-09-26 NOTE — Progress Notes (Signed)
Problem: Falls - Risk of  Goal: *Absence of Falls  Document Schmid Fall Risk and appropriate interventions in the flowsheet.   Outcome: Progressing Towards Goal  Fall Risk Interventions:              Medication Interventions: Patient to call before getting OOB, Teach patient to arise slowly

## 2015-09-26 NOTE — Discharge Summary (Signed)
Discharge Summary by Jillyn Ledger, NP at 09/26/15 1156                Author: Jillyn Ledger, NP  Service: Nurse Practitioner  Author Type: Nurse Practitioner       Filed: 09/26/15 1231  Date of Service: 09/26/15 1156  Status: Attested           Editor: Amenah Tucci, Debbora Lacrosse, NP (Nurse Practitioner)  Cosigner: Franne Forts, MD at 09/26/15 1327          Attestation signed by Franne Forts, MD at 09/26/15 1327          I have examined the patient separately and have discussed discharge management with Ms Mckinzey Entwistle.  We are implementing the discharge plan going forward.      I agree with Discharge Summary as outlined by NP Jasmynn Pfalzgraf.                                    [facility]      Discharge Summary          Patient: Kristina Baker  MRN: [id number]   CSN: [id number]          Date of Birth: 1990/03/11   Age: 25 y.o.   Sex: female      DOA: 09/23/2015  LOS:  LOS: 3 days    Discharge Date: 09/26/2015        Admission Diagnoses: Abdominal pain   Sepsis (HCC)   Gastroenteritis      Discharge Diagnoses:        Problem List as of 09/26/2015   Never Reviewed                Codes  Class  Noted - Resolved             Abdominal pain  ICD-10-CM: R10.9   ICD-9-CM: 789.00    09/23/2015 - Present                       Sepsis (HCC)  ICD-10-CM: A41.9   ICD-9-CM: 038.9, 995.91    09/23/2015 - Present                       * (Principal)Gastroenteritis  ICD-10-CM: K52.9   ICD-9-CM: 558.9    09/23/2015 - Present                       Insomnia  ICD-10-CM: G47.00   ICD-9-CM: 780.52    09/23/2015 - Present                       Anxiety  ICD-10-CM: F41.9   ICD-9-CM: 300.00    09/23/2015 - Present                       Depression  ICD-10-CM: F32.9   ICD-9-CM: 311    09/23/2015 - Present                       Hepatic steatosis  ICD-10-CM: K76.0   ICD-9-CM: 571.8    09/23/2015 - Present                       Lactic acidosis  ICD-10-CM: E87.2   ICD-9-CM: 276.2    09/23/2015 - Present  Discharge Condition: Stable       Discharge To: Home      Consults: Gastroenterology and [facility] Course:    Kristina Baker is a 25 y.o. female who presented to the ED with c/o abdominal pain, vomiting and diarrhea. Patient reported that she developed abdominal pain  yesterday. This occurred a few hours after a dinner meal of chicken at [employer]. The pain was diffuse and non radiating. She also had associated nausea and this morning had profuse vomiting and diarrhea. She was also diaphoretic. She denied  chest pain, sob, dysuria or back pain. Today she had cramping in both legs. In the ED she was started on antibiotic for presumed sepsis. She has WBC 18.8, Lactic acid 3.6. CT abdomen suggest gastroenteritis. In ED patient was noted to have a reaction  to Vancomycin and was stopped and given Benadryl. Patient was admitted for ongoing treatment. Stool samples were sent and were negative for C. Diff as well as Aeromonas, Salmonella, Shigella and E. Coli. Patient continued with IVF and diet was advanced  to GI soft and tolerated. Patient was instructed to use Imodium judiciously and to follow up with GI in 4 weeks. Patient is stable for discharge home.       Physical Exam:   General appearance: alert, cooperative, no distress, appears stated age   Lungs: clear to auscultation throughout   Heart: regular rate and rhythm, S1, S2 normal, no murmur, click, rub or gallop   Abdomen: soft, non tender, non distended. Bowel sounds normoactive    Extremities: extremities normal, atraumatic, no cyanosis or edema   Skin: Skin color, texture, turgor normal. No rashes or lesions   Neurologic: Grossly normal   PSY: Mood and affect normal, appropriately behaved      Significant Diagnostic Studies:      Recent Results (from the past 24 hour(s))     CBC WITH AUTOMATED DIFF          Collection Time: 09/26/15  4:30 AM         Result  Value  Ref Range            WBC  5.5  4.6 - 13.2 K/uL       RBC  3.56 (L)  4.20 - 5.30 M/uL       HGB  11.0 (L)   12.0 - 16.0 g/dL       HCT  16.1 (L)  09.6 - 45.0 %       MCV  92.7  74.0 - 97.0 FL       MCH  30.9  24.0 - 34.0 PG       MCHC  33.3  31.0 - 37.0 g/dL       RDW  04.5  40.9 - 14.5 %       PLATELET  222  135 - 420 K/uL       MPV  10.2  9.2 - 11.8 FL       NEUTROPHILS  32 (L)  40 - 73 %       LYMPHOCYTES  49  21 - 52 %       MONOCYTES  15 (H)  3 - 10 %       EOSINOPHILS  3  0 - 5 %       BASOPHILS  1  0 - 2 %       ABS. NEUTROPHILS  1.8  1.8 - 8.0 K/UL  ABS. LYMPHOCYTES  2.7  0.9 - 3.6 K/UL       ABS. MONOCYTES  0.8  0.05 - 1.2 K/UL       ABS. EOSINOPHILS  0.2  0.0 - 0.4 K/UL       ABS. BASOPHILS  0.0  0.0 - 0.06 K/UL       DF  AUTOMATED          METABOLIC PANEL, BASIC          Collection Time: 09/26/15  4:30 AM         Result  Value  Ref Range            Sodium  142  136 - 145 mmol/L       Potassium  3.7  3.5 - 5.5 mmol/L       Chloride  108  100 - 108 mmol/L       CO2  25  21 - 32 mmol/L       Anion gap  9  3.0 - 18 mmol/L       Glucose  76  74 - 99 mg/dL       BUN  5 (L)  7.0 - 18 MG/DL       Creatinine  1.61 (L)  0.6 - 1.3 MG/DL       BUN/Creatinine ratio  9 (L)  12 - 20         GFR est AA  >60  >60 ml/min/1.45m2       GFR est non-AA  >60  >60 ml/min/1.46m2            Calcium  8.2 (L)  8.5 - 10.1 MG/DL              Discharge Medications:        Current Discharge Medication List              CONTINUE these medications which have NOT CHANGED          Details        traZODone (DESYREL) 50 mg tablet  Take 50 mg by mouth nightly.               LORazepam (ATIVAN) 0.5 mg tablet  Take 0.5 mg by mouth.               buPROPion (WELLBUTRIN) 75 mg tablet  Take  by mouth two (2) times a day.               OTHER  Birth control               venlafaxine-SR (EFFEXOR XR) 37.5 mg capsule  Take 150 mg by mouth daily.               methylphenidate (RITALIN) 5 mg tablet  Take 5 mg by mouth.               HYDROcodone-acetaminophen (NORCO) 5-325 mg per tablet  Take 1 tablet by mouth every six (6) hours as needed for Pain.   Qty: 12  tablet, Refills:  0                         Activity: Activity as tolerated      Diet: BRAT diet as directed by GI       Wound Care: None needed      Follow-up: PCP within 1 week   GI in 4 weeks  Discharge time: 40 minutes    Jillyn LedgerKristi N Nuvia Hileman, NP   09/26/2015, 11:56 AM

## 2015-09-27 NOTE — Progress Notes (Signed)
Patient on discharge report dated 9/1.  Left message on voicemail.  Will attempt to contact again.  Need to complete post-discharge assessment.

## 2015-09-29 LAB — CULTURE, BLOOD
Culture result:: NO GROWTH
Culture result:: NO GROWTH

## 2015-10-01 NOTE — Progress Notes (Signed)
Second attempt to reach patient for TOC Program, and discharge assessment. Discreet VM left. Will send UTR letter.

## 2015-10-03 ENCOUNTER — Encounter: Attending: Internal Medicine | Primary: Internal Medicine

## 2015-10-15 ENCOUNTER — Ambulatory Visit
Admit: 2015-10-15 | Discharge: 2015-10-15 | Payer: PRIVATE HEALTH INSURANCE | Attending: Internal Medicine | Primary: Internal Medicine

## 2015-10-15 DIAGNOSIS — Z0000 Encounter for general adult medical examination without abnormal findings: Secondary | ICD-10-CM

## 2015-10-15 MED ORDER — DIPHTH,PERTUS(ACEL)TETANUS VAC(PF) 2 LF-(5-3-5MCG)-5 LF/0.5 ML IM SUSP
2 Lf-(.5-5-3-5 mcg)-5Lf/0.5 mL | INJECTION | Freq: Once | INTRAMUSCULAR | 0 refills | Status: AC
Start: 2015-10-15 — End: 2015-10-15

## 2015-10-15 NOTE — Patient Instructions (Addendum)
Anxiety Disorder: Care Instructions  Your Care Instructions  Anxiety is a normal reaction to stress. Difficult situations can cause you to have symptoms such as sweaty palms and a nervous feeling.  In an anxiety disorder, the symptoms are far more severe. Constant worry, muscle tension, trouble sleeping, nausea and diarrhea, and other symptoms can make normal daily activities difficult or impossible. These symptoms may occur for no reason, and they can affect your work, school, or social life. Medicines, counseling, and self-care can all help.  Follow-up care is a key part of your treatment and safety. Be sure to make and go to all appointments, and call your doctor if you are having problems. It's also a good idea to know your test results and keep a list of the medicines you take.  How can you care for yourself at home?  ?? Take medicines exactly as directed. Call your doctor if you think you are having a problem with your medicine.  ?? Go to your counseling sessions and follow-up appointments.  ?? Recognize and accept your anxiety. Then, when you are in a situation that makes you anxious, say to yourself, "This is not an emergency. I feel uncomfortable, but I am not in danger. I can keep going even if I feel anxious."  ?? Be kind to your body:  ?? Relieve tension with exercise or a massage.  ?? Get enough rest.  ?? Avoid alcohol, caffeine, nicotine, and illegal drugs. They can increase your anxiety level and cause sleep problems.  ?? Learn and do relaxation techniques. See below for more about these techniques.  ?? Engage your mind. Get out and do something you enjoy. Go to a funny movie, or take a walk or hike. Plan your day. Having too much or too little to do can make you anxious.  ?? Keep a record of your symptoms. Discuss your fears with a good friend or family member, or join a support group for people with similar problems. Talking to others sometimes relieves stress.   ?? Get involved in social groups, or volunteer to help others. Being alone sometimes makes things seem worse than they are.  ?? Get at least 30 minutes of exercise on most days of the week to relieve stress. Walking is a good choice. You also may want to do other activities, such as running, swimming, cycling, or playing tennis or team sports.  Relaxation techniques  Do relaxation exercises 10 to 20 minutes a day. You can play soothing, relaxing music while you do them, if you wish.  ?? Tell others in your house that you are going to do your relaxation exercises. Ask them not to disturb you.  ?? Find a comfortable place, away from all distractions and noise.  ?? Lie down on your back, or sit with your back straight.  ?? Focus on your breathing. Make it slow and steady.  ?? Breathe in through your nose. Breathe out through either your nose or mouth.  ?? Breathe deeply, filling up the area between your navel and your rib cage. Breathe so that your belly goes up and down.  ?? Do not hold your breath.  ?? Breathe like this for 5 to 10 minutes. Notice the feeling of calmness throughout your whole body.  As you continue to breathe slowly and deeply, relax by doing the following for another 5 to 10 minutes:  ?? Tighten and relax each muscle group in your body. You can begin at your toes and work your   way up to your head.  ?? Imagine your muscle groups relaxing and becoming heavy.  ?? Empty your mind of all thoughts.  ?? Let yourself relax more and more deeply.  ?? Become aware of the state of calmness that surrounds you.  ?? When your relaxation time is over, you can bring yourself back to alertness by moving your fingers and toes and then your hands and feet and then stretching and moving your entire body. Sometimes people fall asleep during relaxation, but they usually wake up shortly afterward.  ?? Always give yourself time to return to full alertness before you drive a  car or do anything that might cause an accident if you are not fully alert. Never play a relaxation tape while you drive a car.  When should you call for help?  Call 911 anytime you think you may need emergency care. For example, call if:  ?? You feel you cannot stop from hurting yourself or someone else.  Keep the numbers for these national suicide hotlines: 1-800-273-TALK (1-800-273-8255) and 1-800-SUICIDE (1-800-784-2433). If you or someone you know talks about suicide or feeling hopeless, get help right away.  Watch closely for changes in your health, and be sure to contact your doctor if:  ?? You have anxiety or fear that affects your life.  ?? You have symptoms of anxiety that are new or different from those you had before.  Where can you learn more?  Go to http://www.healthwise.net/[facility].  Enter P754 in the search box to learn more about "Anxiety Disorder: Care Instructions."  Current as of: August 21, 2014  Content Version: 11.3  ?? 2006-2017 [employer]. Care instructions adapted under license by [employer] (which disclaims liability or warranty for this information). If you have questions about a medical condition or this instruction, always ask your healthcare professional. [employer] Inc[employer] disclaims any warranty or liability for your use of this information.

## 2015-10-15 NOTE — Progress Notes (Signed)
Chief Complaint   Patient presents with   ??? Establish Care   ??? Decreased Appetite     Anxiety , 2 weeks     Visit Vitals   ??? BP 115/78 (BP 1 Location: Right arm, BP Patient Position: Sitting)   ??? Pulse 92   ??? Temp 98.2 ??F (36.8 ??C) (Oral)   ??? Resp 12   ??? Ht 5\' 6"  (1.676 m)   ??? Wt 243 lb 12.8 oz (110.6 kg)   ??? SpO2 98%   ??? BMI 39.35 kg/m2     Patient in room # 2. Patient is not fasting.      1. Have you been to the ER, urgent care clinic since your last visit?  Hospitalized since your last visit?Yes When: 09/23/2015 Where: [facility] Reason for visit: Stomach pain, vomiting, diarrhea    2. Have you seen or consulted any other health care providers outside of the [facility] since your last visit?  Include any pap smears or colon screening. No    HM Reviewed.  Upcoming appt. 10/29/2015, Dr.Geary, OBgyn, Women exam.

## 2015-10-15 NOTE — Progress Notes (Signed)
History and Physical    Today's Date:  10/15/2015   Patient's Name: Kristina Baker   Patient's DOB:  15-Jul-1990     History:     Chief Complaint   Patient presents with   ??? Establish Care   ??? Decreased Appetite     Anxiety , 2 weeks     Kristina Baker is a 25 y.o. female presenting for initial visit to establish care.  Will obtain records from previous provider to review. Care team updated on connect care.     Hepatic steatosis  This is a chronic problem, new to me. This is not at goal. Pt takes no medication for this.     Normocytic anemia  This is a new problem. This is not at goal. CBC was last checked on 09/26/15. Pt denies hematochezia or melena. Pt used to have heavy periods.     Anxiety/Depression/Insomnia  This is a chronic problem, new to me. This is controlled. Pt takes trazodone, wellbutrin, effexor and prn ativan . Pt sees a psychiatrist. No SI/HI.     Past Medical History:   Diagnosis Date   ??? ADD (attention deficit disorder)    ??? Anxiety    ??? Depressed    ??? IBS (irritable bowel syndrome) 2012   ??? Normocytic anemia 10/15/2015   ??? Obesity, Class II, BMI 35-39.9 10/15/2015   ??? PTSD (post-traumatic stress disorder) 2008     Past Surgical History:   Procedure Laterality Date   ??? HX ACL RECONSTRUCTION Left 2007    Left knee   ??? HX CHOLECYSTECTOMY  2011   ??? HX WISDOM TEETH EXTRACTION  2009      reports that she has never smoked. She has never used smokeless tobacco. She reports that she drinks about 1.2 oz of alcohol per week  She reports that she does not use illicit drugs.  Family History   Problem Relation Age of Onset   ??? No Known Problems Mother    ??? Diabetes Father    ??? Hypertension Father    ??? Heart defect Sister      bicuspid valve   ??? Cancer Sister      leukemia     Allergies   Allergen Reactions   ??? Lactose Itching     Problem List:      Patient Active Problem List   Diagnosis Code   ??? Abdominal pain R10.9   ??? Insomnia G47.00   ??? Anxiety F41.9   ??? Depression F32.9   ??? Hepatic steatosis K76.0    ??? Normocytic anemia D64.9   ??? Obesity, Class II, BMI 35-39.9 E66.9     Medications:     Current Outpatient Prescriptions   Medication Sig   ??? CRYSELLE, 28, 0.3-30 mg-mcg tab Take 1 Tab by mouth daily.   ??? venlafaxine-SR (EFFEXOR-XR) 150 mg capsule Take 1 Cap by mouth daily.   ??? DICYCLOMINE HCL (DICYCLOMINE PO) Take 1 Tab by mouth daily.   ??? diph,Pertuss,Acell,,Tet Vac-PF (ADACEL) 2 Lf-(2.5-5-3-5 mcg)-5Lf/0.5 mL susp 0.5 mL by IntraMUSCular route once for 1 dose.   ??? buPROPion XL (WELLBUTRIN XL) 150 mg tablet Take 150 mg by mouth every morning.   ??? traZODone (DESYREL) 50 mg tablet Take 50 mg by mouth nightly.   ??? LORazepam (ATIVAN) 0.5 mg tablet Take 0.5 mg by mouth every eight (8) hours as needed.     No current facility-administered medications for this visit.      Review of Systems:   (Positives  in bold)   General:   fevers, chills, generalized weakness, fatigue, weight change, night sweats, appetite decreased  Neurologic: dizziness, lightheadedness, headaches, loss of consciousness, numbness, tingling, focal weakness  Eyes:  vision changes, double vision, photophobia  Ears:  change in hearing, ear pain, ear discharge, ear ringing  Nose:  sneezing, runny nose, nasal congestion  Mouth/Throat: sore throat, voice change, dry mouth, difficulty swallowing  Neck:  pain, stiffness, swelling  Respiratory: dyspnea at rest, dyspnea on exertion, wheezing, cough, sputum production  Cardiovascular:   chest pain, palpitations, pedal edema, leg cramps  Breasts: lumps, discharge, pain, rash, skin changes, changes on self-exam  Gastrointestinal:  nausea, vomiting, abdominal pain, constipation, diarrhea, heart burn, bloody stools, tarry black stools, rectal pain, hemorrhoids  Urinary: dysuria, urinary frequency, nocturia, malodorous urine  Genital (F): vaginal discharge, ulcerations, rashes, change in menses, pelvic pain  Musculoskeletal:  joint pain, joint stiffness, joint swelling, back pain, focal muscle pain, diffuse myalgias   Psychiatric: insomnia, anxiety, depression, hallucinations, suicidal ideation, homicidal ideation  Endocrine: polydipsia, polyuria, polyphagia, cold intolerance, heat intolerance  Hematologic: easy bruising, easy bleeding  Dermatologic: Itching, rash    Physical Assessment:   VS:    Visit Vitals   ??? BP 115/78 (BP 1 Location: Right arm, BP Patient Position: Sitting)   ??? Pulse 92   ??? Temp 98.2 ??F (36.8 ??C) (Oral)   ??? Resp 12   ??? Ht 5\' 6"  (1.676 m)   ??? Wt 243 lb 12.8 oz (110.6 kg)   ??? LMP 09/24/2015   ??? SpO2 98%   ??? BMI 39.35 kg/m2     General:   Well-groomed, well-nourished, in no distress, pleasant, alert, appropriate and conversant.   Eyes:    PERRL, EOMI  Ears:  TMs normal, no ear wax  Mouth:  MMM, good dentition, oropharynx WNL without membranes, exudates, petechiae or ulcers  Neck:   Neck supple, no swelling, mass or tenderness  Cardiovascular:   No JVD.  RRR, no MRG.   Pulmonary:   Lungs clear bilaterally.  Normal respiratory effort.  Abdomen:   Abdomen soft, NT, ND, NAB  Extremities:   No edema, LEs warm and well-perfused.  Neuro:   Alert and oriented, no focal deficits. No facial asymmetry noted.  Skin:    No rash or jaundice  MSK:   Normal ROM, 5/5 muscle strength  Psych:  No pressured speech or abnormal thought content    Assessment/Plan & Orders:         ICD-10-CM ICD-9-CM    1. Routine general medical examination at a health care facility Z00.00 V70.0 HEMOGLOBIN A1C WITH EAG      T4, FREE      TSH 3RD GENERATION   2. Hepatic steatosis K76.0 571.8 LIPID PANEL      METABOLIC PANEL, COMPREHENSIVE   3. Normocytic anemia D64.9 285.9 CBC WITH AUTOMATED DIFF      IRON PROFILE      FERRITIN   4. Insomnia, unspecified type G47.00 780.52    5. Anxiety F41.9 300.00    6. 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 Encounter for immunization Z23 V03.89 diph,Pertuss,Acell,,Tet Vac-PF (ADACEL) 2 Lf-(2.5-5-3-5 mcg)-5Lf/0.5 mL susp    8. Screening for depression Z13.89 V79.0 PR DEPRESSION SCREEN ANNUAL     HM  Colon cancer: colonoscopy due at age 25  Dyslipidemia: will check FLP and CMP  Diabetes mellitus: will check FBG  Influenza vaccine: due, pt will get at work  Pneumococcal vaccine: due at age 55   Tdap: unknown  Herpes Zoster vaccine: due at age 25  Hep B vaccine: not indicated (liver dz, DM 19-59)  Weight:  Body mass index is 39.35 kg/(m^2). Discussed the patient's BMI with her.  The BMI follow up plan is as follows: Improve diet and 30 min of moderate activity at least 5 times a week  Cervical cancer:  pap smear 11/2013, has gyn  Breast Cancer: mammogram at age 25  Osteoporosis: No indication for DEXA scan    Healthy lifestyle has been encouraged including avoidance of tobacco, limiting or avoiding alcohol intake, heart healthy diet which is low in cholesterol and saturated fat and contains fresh fruits, vegetables and whole grains and fiber, regular exercise with goals of 20-30 minutes 3-5 days weekly and maintaining an optimal BMI.   Information given on low FODMAP diet  Follow up with psychiatry  Pt to get flu shot at work  Fasting labs ordered    Follow-up Disposition:  Return in about 2 weeks (around 10/29/2015) for Follow up, Go over lab/imaging results.    *Patient verbalized understanding and agreement with the plan.  Patient was given an after-visit summary.    Fonnie Mu. Deriyah Kunath, MD - Internal Medicine  10/15/2015, 3:59 PM  [facility]  [address] [place], [place] [address]  Phone [phone]  Fax [phone]

## 2015-10-17 NOTE — Progress Notes (Signed)
Patient identified as eligible for [employer] services.  Third telephone outreach attempted.  Left discreet voicemail with this CM confidential contact information.

## 2015-10-29 ENCOUNTER — Inpatient Hospital Stay: Admit: 2015-10-29 | Payer: PRIVATE HEALTH INSURANCE | Primary: Internal Medicine

## 2015-10-29 DIAGNOSIS — Z0000 Encounter for general adult medical examination without abnormal findings: Secondary | ICD-10-CM

## 2015-10-29 LAB — CBC WITH AUTOMATED DIFF
ABS. BASOPHILS: 0 10*3/uL (ref 0.0–0.06)
ABS. EOSINOPHILS: 0.2 10*3/uL (ref 0.0–0.4)
ABS. LYMPHOCYTES: 3.3 10*3/uL (ref 0.9–3.6)
ABS. MONOCYTES: 0.8 10*3/uL (ref 0.05–1.2)
ABS. NEUTROPHILS: 5.1 10*3/uL (ref 1.8–8.0)
BASOPHILS: 0 % (ref 0–2)
EOSINOPHILS: 2 % (ref 0–5)
HCT: 35.9 % (ref 35.0–45.0)
HGB: 12.1 g/dL (ref 12.0–16.0)
LYMPHOCYTES: 35 % (ref 21–52)
MCH: 31.3 PG (ref 24.0–34.0)
MCHC: 33.7 g/dL (ref 31.0–37.0)
MCV: 92.8 FL (ref 74.0–97.0)
MONOCYTES: 8 % (ref 3–10)
MPV: 9.2 FL (ref 9.2–11.8)
NEUTROPHILS: 55 % (ref 40–73)
PLATELET: 305 10*3/uL (ref 135–420)
RBC: 3.87 M/uL — ABNORMAL LOW (ref 4.20–5.30)
RDW: 12.4 % (ref 11.6–14.5)
WBC: 9.4 10*3/uL (ref 4.6–13.2)

## 2015-10-29 LAB — HM PAP SMEAR: Pap Smear, External: NORMAL

## 2015-10-29 LAB — T4, FREE: T4, Free: 0.9 NG/DL (ref 0.7–1.5)

## 2015-10-29 NOTE — Progress Notes (Signed)
Resolving current episode for case management due to patient unable to reach. Patient has not been reached after repeated calls and letters. Letter sent to patient notifying completion of services due to unable to reach. This writer's contact information and information regarding program services included in materials sent.

## 2015-10-30 LAB — METABOLIC PANEL, COMPREHENSIVE
A-G Ratio: 1 (ref 0.8–1.7)
ALT (SGPT): 94 U/L — ABNORMAL HIGH (ref 13–56)
AST (SGOT): 36 U/L (ref 15–37)
Albumin: 3.7 g/dL (ref 3.4–5.0)
Alk. phosphatase: 74 U/L (ref 45–117)
Anion gap: 9 mmol/L (ref 3.0–18)
BUN/Creatinine ratio: 21 — ABNORMAL HIGH (ref 12–20)
BUN: 15 MG/DL (ref 7.0–18)
Bilirubin, total: 0.3 MG/DL (ref 0.2–1.0)
CO2: 24 mmol/L (ref 21–32)
Calcium: 8.8 MG/DL (ref 8.5–10.1)
Chloride: 106 mmol/L (ref 100–108)
Creatinine: 0.73 MG/DL (ref 0.6–1.3)
GFR est AA: >60 mL/min/{1.73_m2} (ref >60)
GFR est non-AA: >60 mL/min/{1.73_m2} (ref >60)
Globulin: 3.8 g/dL (ref 2.0–4.0)
Glucose: 87 mg/dL (ref 74–99)
Potassium: 4.3 mmol/L (ref 3.5–5.5)
Protein, total: 7.5 g/dL (ref 6.4–8.2)
Sodium: 139 mmol/L (ref 136–145)

## 2015-10-30 LAB — LIPID PANEL
CHOL/HDL Ratio: 2.7 (ref 0–5.0)
Cholesterol, total: 127 MG/DL (ref <200)
HDL Cholesterol: 47 MG/DL (ref 40–60)
LDL, calculated: 60.4 MG/DL (ref 0–100)
Triglyceride: 98 MG/DL (ref <150)
VLDL, calculated: 19.6 MG/DL

## 2015-10-30 LAB — HEMOGLOBIN A1C WITH EAG
Est. average glucose: 100 mg/dL
Hemoglobin A1c: 5.1 % (ref 4.2–5.6)

## 2015-10-30 LAB — FERRITIN: Ferritin: 105 NG/ML (ref 8–388)

## 2015-10-30 LAB — IRON PROFILE
Iron % saturation: 31 %
Iron: 89 ug/dL (ref 50–175)
TIBC: 287 ug/dL (ref 250–450)

## 2015-10-30 LAB — TSH 3RD GENERATION: TSH: 2.46 u[IU]/mL (ref 0.36–3.74)

## 2015-10-31 ENCOUNTER — Encounter: Attending: Internal Medicine | Primary: Internal Medicine

## 2015-11-12 ENCOUNTER — Ambulatory Visit
Admit: 2015-11-12 | Discharge: 2015-11-12 | Payer: PRIVATE HEALTH INSURANCE | Attending: Internal Medicine | Primary: Internal Medicine

## 2015-11-12 DIAGNOSIS — R7401 Elevation of levels of liver transaminase levels: Secondary | ICD-10-CM

## 2015-11-12 NOTE — Progress Notes (Signed)
Internal Medicine Progress Note    Today's Date:  11/12/2015   Patient:  Kristina Baker  Patient DOB:  06-06-1990    Subjective:     Chief Complaint   Patient presents with   ??? Results      Transaminitis  This is a new problem. This is not at goal. CT scan in the hospital showed possible hepatic steatosis. Pt was in the hospital for abdominal pain, vomiting, diarrhea around the time when this was discovered.   ??  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 This is not at goal. Pt does not exercise regularly. Pt tries to eat a healthy diet.   ??  Anxiety/Depression/Insomnia  This is a chronic problem. This is controlled. Pt takes trazodone, wellbutrin, effexor and prn ativan . Pt sees a psychiatrist. No SI/HI.     Past Medical History:   Diagnosis Date   ??? ADD (attention deficit disorder)    ??? Anxiety    ??? Depressed    ??? IBS (irritable bowel syndrome) 2012   ??? Normocytic anemia 27   ??? Obesity, Class II, BMI 35-39.9 27   ??? Obesity, Class III, BMI 40-49.9 (morbid obesity) ([place]) 11/12/2015   ??? PTSD (post-traumatic stress disorder) 2008   ??? Transaminitis 11/12/2015     Past Surgical History:   Procedure Laterality Date   ??? HX ACL RECONSTRUCTION Left 2007    Left knee   ??? HX CHOLECYSTECTOMY  2011   ??? HX WISDOM TEETH EXTRACTION  2009      reports that she has never smoked. She has never used smokeless tobacco. She reports that she drinks about 1.2 oz of alcohol per week  She reports that she does not use illicit drugs.  Family History   Problem Relation Age of Onset   ??? No Known Problems Mother    ??? Diabetes Father    ??? Hypertension Father    ??? Heart defect Sister      bicuspid valve   ??? Cancer Sister      leukemia     Allergies   Allergen Reactions   ??? Lactose Itching     Review of Systems   Positives in bold  CV:      chest pain, palpitations  PULM:  SOB, wheezing, cough, sputum production    Current Outpatient Meds and Allergies     Current Outpatient Prescriptions on File Prior to Visit    Medication Sig Dispense Refill   ??? CRYSELLE, 28, 0.3-30 mg-mcg tab Take 1 Tab by mouth daily.  2   ??? venlafaxine-SR (EFFEXOR-XR) 150 mg capsule Take 1 Cap by mouth daily.  1   ??? DICYCLOMINE HCL (DICYCLOMINE PO) Take 1 Tab by mouth daily.     ??? buPROPion XL (WELLBUTRIN XL) 150 mg tablet Take 150 mg by mouth every morning.     ??? traZODone (DESYREL) 50 mg tablet Take 50 mg by mouth nightly.     ??? LORazepam (ATIVAN) 0.5 mg tablet Take 0.5 mg by mouth every eight (8) hours as needed.       No current facility-administered medications on file prior to visit.      Allergies   Allergen Reactions   ??? Lactose Itching     Objective:     VS:    Visit Vitals   ??? BP 119/72 (BP 1 Location: Right arm, BP Patient Position: Sitting)   ??? Pulse 80   ??? Temp 98.2 ??F (36.8 ??C) (Oral)   ???  Resp 12   ??? Ht _0  (1.676 m)   ??? Wt 249 lb 12.8 oz (113.3 kg)   ??? LMP 10/23/2015   ??? SpO2 98%   ??? BMI 40.32 kg/m2     General:   Well-nourished, well-groomed, pleasant, alert, in no acute distress  Head:  Normocephalic, atraumatic  Ears:  External ears WNL  Nose:  External nares WNL  Psych:  No pressured speech, no abnormal thought content    Hospital Outpatient Visit on 10/29/2015   Component Date Value Ref Range Status   ??? WBC 10/29/2015 9.4  4.6 - 13.2 K/uL Final   ??? RBC 10/29/2015 3.87* 4.20 - 5.30 M/uL Final   ??? HGB 10/29/2015 12.1  12.0 - 16.0 g/dL Final   ??? HCT 10/29/2015 35.9  35.0 - 45.0 % Final   ??? MCV 10/29/2015 92.8  74.0 - 97.0 FL Final   ??? MCH 10/29/2015 31.3  24.0 - 34.0 PG Final   ??? MCHC 10/29/2015 33.7  31.0 - 37.0 g/dL Final   ??? RDW 10/29/2015 12.4  11.6 - 14.5 % Final   ??? PLATELET 10/29/2015 305  135 - 420 K/uL Final   ??? MPV 10/29/2015 9.2  9.2 - 11.8 FL Final   ??? NEUTROPHILS 10/29/2015 55  40 - 73 % Final   ??? LYMPHOCYTES 10/29/2015 35  21 - 52 % Final   ??? MONOCYTES 10/29/2015 8  3 - 10 % Final   ??? EOSINOPHILS 10/29/2015 2  0 - 5 % Final   ??? BASOPHILS 10/29/2015 0  0 - 2 % Final    ??? ABS. NEUTROPHILS 10/29/2015 5.1  1.8 - 8.0 K/UL Final   ??? ABS. LYMPHOCYTES 10/29/2015 3.3  0.9 - 3.6 K/UL Final   ??? ABS. MONOCYTES 10/29/2015 0.8  0.05 - 1.2 K/UL Final   ??? ABS. EOSINOPHILS 10/29/2015 0.2  0.0 - 0.4 K/UL Final   ??? ABS. BASOPHILS 10/29/2015 0.0  0.0 - 0.06 K/UL Final   ??? DF 10/29/2015 AUTOMATED    Final   ??? Ferritin 10/29/2015 105  8 - 388 NG/ML Final   ??? T4, Free 10/29/2015 0.9  0.7 - 1.5 NG/DL Final   ??? Iron 10/29/2015 89  50 - 175 ug/dL Final    Patients receiving metal-binding drugs (e.g. deferoxamine) may show spuriously depressed iron values, as chelated iron may not properly react in the iron assay.   ??? TIBC 10/29/2015 287  250 - 450 ug/dL Final   ??? Iron % saturation 10/29/2015 31  % Final   ??? LIPID PROFILE 10/29/2015        Final   ??? Cholesterol, total 10/29/2015 127  <200 MG/DL Final   ??? Triglyceride 10/29/2015 98  <150 MG/DL Final    Comment: The drugs N-acetylcysteine (NAC) and  Metamiszole have been found to cause falsely  low results in this chemical assay. Please  be sure to submit blood samples obtained  BEFORE administration of either of these  drugs to assure correct results.     ??? HDL Cholesterol 10/29/2015 47  40 - 60 MG/DL Final   ??? LDL, calculated 10/29/2015 60.4  0 - 100 MG/DL Final   ??? VLDL, calculated 10/29/2015 19.6  MG/DL Final   ??? CHOL/HDL Ratio 10/29/2015 2.7  0 - 5.0   Final   ??? Sodium 10/29/2015 139  136 - 145 mmol/L Final   ??? Potassium 10/29/2015 4.3  3.5 - 5.5 mmol/L Final   ??? Chloride 10/29/2015 106  100 -  108 mmol/L Final   ??? CO2 10/29/2015 24  21 - 32 mmol/L Final   ??? Anion gap 10/29/2015 9  3.0 - 18 mmol/L Final   ??? Glucose 10/29/2015 87  74 - 99 mg/dL Final   ??? BUN 10/29/2015 15  7.0 - 18 MG/DL Final   ??? Creatinine 10/29/2015 0.73  0.6 - 1.3 MG/DL Final   ??? BUN/Creatinine ratio 10/29/2015 21* 12 - 20   Final   ??? GFR est AA 10/29/2015 >60  >60 ml/min/1.65m Final   ??? GFR est non-AA 10/29/2015 >60  >60 ml/min/1.782mFinal    Comment: (NOTE)   Estimated GFR is calculated using the Modification of Diet in Renal   Disease (MDRD) Study equation, reported for both African Americans   (GFRAA) and non-African Americans (GFRNA), and normalized to 1.7379m body surface area. The physician must decide which value applies to   the patient. The MDRD study equation should only be used in   individuals age 59 33 older. It has not been validated for the   following: pregnant women, patients with serious comorbid conditions,   or on certain medications, or persons with extremes of body size,   muscle mass, or nutritional status.     ??? Calcium 10/29/2015 8.8  8.5 - 10.1 MG/DL Final   ??? Bilirubin, total 10/29/2015 0.3  0.2 - 1.0 MG/DL Final   ??? ALT (SGPT) 10/29/2015 94* 13 - 56 U/L Final   ??? AST (SGOT) 10/29/2015 36  15 - 37 U/L Final   ??? Alk. phosphatase 10/29/2015 74  45 - 117 U/L Final   ??? Protein, total 10/29/2015 7.5  6.4 - 8.2 g/dL Final   ??? Albumin 10/29/2015 3.7  3.4 - 5.0 g/dL Final   ??? Globulin 10/29/2015 3.8  2.0 - 4.0 g/dL Final   ??? A-G Ratio 10/29/2015 1.0  0.8 - 1.7   Final   ??? TSH 10/29/2015 2.46  0.36 - 3.74 uIU/mL Final   ??? Hemoglobin A1c 10/29/2015 5.1  4.2 - 5.6 % Final    Comment: (NOTE)  HbA1C Interpretive Ranges  <5.7              Normal  5.7 - 6.4         Consider Prediabetes  >6.5              Consider Diabetes     ??? Est. average glucose 10/29/2015 100  mg/dL Final    Comment: (NOTE)  The eAG should be interpreted with patient characteristics in mind   since ethnicity, interindividual differences, red cell lifespan,   variation in rates of glycation, etc. may affect the validity of the   calculation.     Admission on 09/23/2015, Discharged on 09/26/2015   Component Date Value Ref Range Status   ??? WBC 09/23/2015 18.8* 4.6 - 13.2 K/uL Final   ??? RBC 09/23/2015 4.87  4.20 - 5.30 M/uL Final   ??? HGB 09/23/2015 15.2  12.0 - 16.0 g/dL Final   ??? HCT 09/23/2015 44.9  35.0 - 45.0 % Final   ??? MCV 09/23/2015 92.2  74.0 - 97.0 FL Final    ??? MCH 09/23/2015 31.2  24.0 - 34.0 PG Final   ??? MCHC 09/23/2015 33.9  31.0 - 37.0 g/dL Final   ??? RDW 09/23/2015 12.1  11.6 - 14.5 % Final   ??? PLATELET 09/23/2015 348  135 - 420 K/uL Final   ??? MPV 09/23/2015 9.8  9.2 - 11.8 FL  Final   ??? NEUTROPHILS 09/23/2015 93* 40 - 73 % Final   ??? LYMPHOCYTES 09/23/2015 3* 21 - 52 % Final   ??? MONOCYTES 09/23/2015 4  3 - 10 % Final   ??? EOSINOPHILS 09/23/2015 0  0 - 5 % Final   ??? BASOPHILS 09/23/2015 0  0 - 2 % Final   ??? ABS. NEUTROPHILS 09/23/2015 17.4* 1.8 - 8.0 K/UL Final   ??? ABS. LYMPHOCYTES 09/23/2015 0.6* 0.9 - 3.6 K/UL Final   ??? ABS. MONOCYTES 09/23/2015 0.8  0.05 - 1.2 K/UL Final   ??? ABS. EOSINOPHILS 09/23/2015 0.0  0.0 - 0.4 K/UL Final   ??? ABS. BASOPHILS 09/23/2015 0.0  0.0 - 0.06 K/UL Final   ??? DF 09/23/2015 AUTOMATED    Final   ??? Lipase 09/23/2015 109  73 - 393 U/L Final   ??? Sodium 09/23/2015 137  136 - 145 mmol/L Final   ??? Potassium 09/23/2015 4.6  3.5 - 5.5 mmol/L Final   ??? Chloride 09/23/2015 103  100 - 108 mmol/L Final   ??? CO2 09/23/2015 21  21 - 32 mmol/L Final   ??? Anion gap 09/23/2015 13  3.0 - 18 mmol/L Final   ??? Glucose 09/23/2015 143* 74 - 99 mg/dL Final   ??? BUN 09/23/2015 15  7.0 - 18 MG/DL Final   ??? Creatinine 09/23/2015 0.99  0.6 - 1.3 MG/DL Final   ??? BUN/Creatinine ratio 09/23/2015 15  12 - 20   Final   ??? GFR est AA 09/23/2015 >60  >60 ml/min/1.33m Final   ??? GFR est non-AA 09/23/2015 >60  >60 ml/min/1.756mFinal    Comment: (NOTE)  Estimated GFR is calculated using the Modification of Diet in Renal   Disease (MDRD) Study equation, reported for both African Americans   (GFRAA) and non-African Americans (GFRNA), and normalized to 1.7324m body surface area. The physician must decide which value applies to   the patient. The MDRD study equation should only be used in   individuals age 15 58 older. It has not been validated for the   following: pregnant women, patients with serious comorbid conditions,    or on certain medications, or persons with extremes of body size,   muscle mass, or nutritional status.     ??? Calcium 09/23/2015 9.4  8.5 - 10.1 MG/DL Final   ??? Bilirubin, total 09/23/2015 0.9  0.2 - 1.0 MG/DL Final   ??? ALT (SGPT) 09/23/2015 158* 13 - 56 U/L Final   ??? AST (SGOT) 09/23/2015 77* 15 - 37 U/L Final   ??? Alk. phosphatase 09/23/2015 83  45 - 117 U/L Final   ??? Protein, total 09/23/2015 8.5* 6.4 - 8.2 g/dL Final   ??? Albumin 09/23/2015 3.9  3.4 - 5.0 g/dL Final   ??? Globulin 09/23/2015 4.6* 2.0 - 4.0 g/dL Final   ??? A-G Ratio 09/23/2015 0.8  0.8 - 1.7   Final   ??? Color 09/23/2015 DARK YELLOW    Final   ??? Appearance 09/23/2015 CLOUDY    Final   ??? Specific gravity 09/23/2015 >1.030* 1.005 - 1.030 Final   ??? pH (UA) 09/23/2015 5.5  5.0 - 8.0   Final   ??? Protein 09/23/2015 TRACE* NEG mg/dL Final   ??? Glucose 09/23/2015 NEGATIVE   NEG mg/dL Final   ??? Ketone 09/23/2015 TRACE* NEG mg/dL Final   ??? Bilirubin 09/23/2015 SMALL* NEG   Final    Comment: (NOTE)  Positive, unable to confirm with Ictotest.     ???  Blood 09/23/2015 NEGATIVE   NEG   Final   ??? Urobilinogen 09/23/2015 0.2  0.2 - 1.0 EU/dL Final   ??? Nitrites 09/23/2015 NEGATIVE   NEG   Final   ??? Leukocyte Esterase 09/23/2015 TRACE* NEG   Final   ??? HCG urine, Ql. 09/23/2015 NEGATIVE   NEG   Final    Test results should be confirmed using serum quantitative hCG when detection of pregnancy is critical and before performing any critical medical procedure.   ??? WBC 09/23/2015 0 to 3  0 - 4 /hpf Final   ??? RBC 09/23/2015 0  0 - 5 /hpf Final   ??? Epithelial cells 09/23/2015 2+  0 - 5 /lpf Final   ??? Bacteria 09/23/2015 1+* NEG /hpf Final   ??? Mucus 09/23/2015 3+* NEG /lpf Final   ??? Special Requests: 09/23/2015 NO SPECIAL REQUESTS    Final   ??? Culture result: 09/23/2015 NO GROWTH 6 DAYS    Final   ??? Special Requests: 09/23/2015 NO SPECIAL REQUESTS    Final   ??? Culture result: 09/23/2015 NO GROWTH 6 DAYS    Final   ??? Ventricular Rate 09/23/2015 103  BPM Final    ??? Atrial Rate 09/23/2015 103  BPM Final   ??? P-R Interval 09/23/2015 170  ms Final   ??? QRS Duration 09/23/2015 76  ms Final   ??? Q-T Interval 09/23/2015 346  ms Final   ??? QTC Calculation (Bezet) 09/23/2015 453  ms Final   ??? Calculated P Axis 09/23/2015 14  degrees Final   ??? Calculated R Axis 09/23/2015 31  degrees Final   ??? Calculated T Axis 09/23/2015 26  degrees Final   ??? Diagnosis 09/23/2015    Final                    Value:Sinus tachycardia  Otherwise normal ECG  No previous ECGs available  Confirmed by Aileen Pilot ([id number]) on 09/24/2015 2:39:37 PM     ??? Lactic Acid (POC) 09/23/2015 3.6* 0.4 - 2.0 mmol/L Final   ??? Special Requests: 09/24/2015 NO SPECIAL REQUESTS    Final   ??? Culture result: 09/24/2015 NO AEROMONAS,SALMONELLA,SHIGELLA,E. COLI 0:157 OR CAMPYLOBACTER ISOLATED    Final   ??? Culture result: 09/24/2015 REDUCED GRAM NEGATIVE ENTERIC MICROBIOTA    Final   ??? CK 09/23/2015 67  26 - 192 U/L Final   ??? Lactic Acid (POC) 09/23/2015 2.4* 0.4 - 2.0 mmol/L Final   ??? Sodium 09/24/2015 138  136 - 145 mmol/L Final   ??? Potassium 09/24/2015 4.0  3.5 - 5.5 mmol/L Final   ??? Chloride 09/24/2015 106  100 - 108 mmol/L Final   ??? CO2 09/24/2015 23  21 - 32 mmol/L Final   ??? Anion gap 09/24/2015 9  3.0 - 18 mmol/L Final   ??? Glucose 09/24/2015 98  74 - 99 mg/dL Final   ??? BUN 09/24/2015 10  7.0 - 18 MG/DL Final   ??? Creatinine 09/24/2015 0.74  0.6 - 1.3 MG/DL Final   ??? BUN/Creatinine ratio 09/24/2015 14  12 - 20   Final   ??? GFR est AA 09/24/2015 >60  >60 ml/min/1.73m Final   ??? GFR est non-AA 09/24/2015 >60  >60 ml/min/1.721mFinal   ??? Calcium 09/24/2015 7.5* 8.5 - 10.1 MG/DL Final   ??? Bilirubin, total 09/24/2015 0.5  0.2 - 1.0 MG/DL Final   ??? ALT (SGPT) 09/24/2015 97* 13 - 56 U/L Final   ??? AST (SGOT) 09/24/2015  38* 15 - 37 U/L Final   ??? Alk. phosphatase 09/24/2015 57  45 - 117 U/L Final   ??? Protein, total 09/24/2015 6.2* 6.4 - 8.2 g/dL Final   ??? Albumin 09/24/2015 3.0* 3.4 - 5.0 g/dL Final    ??? Globulin 09/24/2015 3.2  2.0 - 4.0 g/dL Final   ??? A-G Ratio 09/24/2015 0.9  0.8 - 1.7   Final   ??? WBC 09/24/2015 10.7  4.6 - 13.2 K/uL Final   ??? RBC 09/24/2015 3.90* 4.20 - 5.30 M/uL Final   ??? HGB 09/24/2015 12.2  12.0 - 16.0 g/dL Final    FOLLOWING RESULTS VERIFIED BY REPETITION:   ??? HCT 09/24/2015 36.5  35.0 - 45.0 % Final   ??? MCV 09/24/2015 93.6  74.0 - 97.0 FL Final   ??? MCH 09/24/2015 31.3  24.0 - 34.0 PG Final   ??? MCHC 09/24/2015 33.4  31.0 - 37.0 g/dL Final   ??? RDW 09/24/2015 12.3  11.6 - 14.5 % Final   ??? PLATELET 09/24/2015 255  135 - 420 K/uL Final   ??? MPV 09/24/2015 10.3  9.2 - 11.8 FL Final   ??? NEUTROPHILS 09/24/2015 80* 40 - 73 % Final   ??? LYMPHOCYTES 09/24/2015 11* 21 - 52 % Final   ??? MONOCYTES 09/24/2015 9  3 - 10 % Final   ??? EOSINOPHILS 09/24/2015 0  0 - 5 % Final   ??? BASOPHILS 09/24/2015 0  0 - 2 % Final   ??? ABS. NEUTROPHILS 09/24/2015 8.6* 1.8 - 8.0 K/UL Final   ??? ABS. LYMPHOCYTES 09/24/2015 1.1  0.9 - 3.6 K/UL Final   ??? ABS. MONOCYTES 09/24/2015 1.0  0.05 - 1.2 K/UL Final   ??? ABS. EOSINOPHILS 09/24/2015 0.0  0.0 - 0.4 K/UL Final   ??? ABS. BASOPHILS 09/24/2015 0.0  0.0 - 0.06 K/UL Final   ??? DF 09/24/2015 AUTOMATED    Final   ??? Prothrombin time 09/24/2015 14.4  11.5 - 15.2 sec Final   ??? INR 09/24/2015 1.2  0.8 - 1.2   Final    Comment:            INR Therapeutic Ranges         (on stable oral anticoagulant):     INDICATION                INR  DVT/PE/Atrial Fib          2.0-3.0  MI/Mechanical Heart Valve  2.5-3.5     ??? Lactic acid 09/24/2015 2.1* 0.4 - 2.0 MMOL/L Final    Comment: CALLED TO AND CORRECTLY REPEATED BY:  STAR ROBERTS RN,[id number], ON 09/24/15 AT 0130 TO JWS     ??? Lactic acid 09/24/2015 1.9  0.4 - 2.0 MMOL/L Final   ??? Special Requests: 09/24/2015 NO SPECIAL REQUESTS    Final   ??? Culture result: 09/24/2015 Toxigenic C. difficile NEGATIVE                         C. difficile target DNA sequences are not detected.    Final   ??? WBC 09/25/2015 6.3  4.6 - 13.2 K/uL Final    ??? RBC 09/25/2015 3.47* 4.20 - 5.30 M/uL Final   ??? HGB 09/25/2015 10.7* 12.0 - 16.0 g/dL Final   ??? HCT 09/25/2015 32.2* 35.0 - 45.0 % Final   ??? MCV 09/25/2015 92.8  74.0 - 97.0 FL Final   ??? MCH 09/25/2015 30.8  24.0 - 34.0  PG Final   ??? MCHC 09/25/2015 33.2  31.0 - 37.0 g/dL Final   ??? RDW 09/25/2015 12.5  11.6 - 14.5 % Final   ??? PLATELET 09/25/2015 212  135 - 420 K/uL Final   ??? MPV 09/25/2015 9.9  9.2 - 11.8 FL Final   ??? NEUTROPHILS 09/25/2015 43  40 - 73 % Final   ??? LYMPHOCYTES 09/25/2015 32  21 - 52 % Final   ??? MONOCYTES 09/25/2015 23* 3 - 10 % Final   ??? EOSINOPHILS 09/25/2015 2  0 - 5 % Final   ??? BASOPHILS 09/25/2015 0  0 - 2 % Final   ??? ABS. NEUTROPHILS 09/25/2015 2.8  1.8 - 8.0 K/UL Final   ??? ABS. LYMPHOCYTES 09/25/2015 2.0  0.9 - 3.6 K/UL Final   ??? ABS. MONOCYTES 09/25/2015 1.4* 0.05 - 1.2 K/UL Final   ??? ABS. EOSINOPHILS 09/25/2015 0.1  0.0 - 0.4 K/UL Final   ??? ABS. BASOPHILS 09/25/2015 0.0  0.0 - 0.06 K/UL Final   ??? DF 09/25/2015 AUTOMATED    Final   ??? Vancomycin,trough 09/25/2015 10.3  10.0 - 20.0 ug/mL Final   ??? Reported dose date: 09/25/2015 [id number]    Final   ??? Reported dose time: 09/25/2015 0    Final   ??? Reported dose: 09/25/2015 1,000 MG  UNITS Final   ??? WBC 09/26/2015 5.5  4.6 - 13.2 K/uL Final   ??? RBC 09/26/2015 3.56* 4.20 - 5.30 M/uL Final   ??? HGB 09/26/2015 11.0* 12.0 - 16.0 g/dL Final   ??? HCT 09/26/2015 33.0* 35.0 - 45.0 % Final   ??? MCV 09/26/2015 92.7  74.0 - 97.0 FL Final   ??? MCH 09/26/2015 30.9  24.0 - 34.0 PG Final   ??? MCHC 09/26/2015 33.3  31.0 - 37.0 g/dL Final   ??? RDW 09/26/2015 12.4  11.6 - 14.5 % Final   ??? PLATELET 09/26/2015 222  135 - 420 K/uL Final   ??? MPV 09/26/2015 10.2  9.2 - 11.8 FL Final   ??? NEUTROPHILS 09/26/2015 32* 40 - 73 % Final   ??? LYMPHOCYTES 09/26/2015 49  21 - 52 % Final   ??? MONOCYTES 09/26/2015 15* 3 - 10 % Final   ??? EOSINOPHILS 09/26/2015 3  0 - 5 % Final   ??? BASOPHILS 09/26/2015 1  0 - 2 % Final   ??? ABS. NEUTROPHILS 09/26/2015 1.8  1.8 - 8.0 K/UL Final    ??? ABS. LYMPHOCYTES 09/26/2015 2.7  0.9 - 3.6 K/UL Final   ??? ABS. MONOCYTES 09/26/2015 0.8  0.05 - 1.2 K/UL Final   ??? ABS. EOSINOPHILS 09/26/2015 0.2  0.0 - 0.4 K/UL Final   ??? ABS. BASOPHILS 09/26/2015 0.0  0.0 - 0.06 K/UL Final   ??? DF 09/26/2015 AUTOMATED    Final   ??? Sodium 09/26/2015 142  136 - 145 mmol/L Final   ??? Potassium 09/26/2015 3.7  3.5 - 5.5 mmol/L Final   ??? Chloride 09/26/2015 108  100 - 108 mmol/L Final   ??? CO2 09/26/2015 25  21 - 32 mmol/L Final   ??? Anion gap 09/26/2015 9  3.0 - 18 mmol/L Final   ??? Glucose 09/26/2015 76  74 - 99 mg/dL Final   ??? BUN 09/26/2015 5* 7.0 - 18 MG/DL Final   ??? Creatinine 09/26/2015 0.58* 0.6 - 1.3 MG/DL Final   ??? BUN/Creatinine ratio 09/26/2015 9* 12 - 20   Final   ??? GFR est AA 09/26/2015 >60  >60 ml/min/1.4m  Final   ??? GFR est non-AA 09/26/2015 >60  >60 ml/min/1.63m Final    Comment: (NOTE)  Estimated GFR is calculated using the Modification of Diet in Renal   Disease (MDRD) Study equation, reported for both African Americans   (GFRAA) and non-African Americans (GFRNA), and normalized to 1.777m  body surface area. The physician must decide which value applies to   the patient. The MDRD study equation should only be used in   individuals age 466r older. It has not been validated for the   following: pregnant women, patients with serious comorbid conditions,   or on certain medications, or persons with extremes of body size,   muscle mass, or nutritional status.     ??? Calcium 09/26/2015 8.2* 8.5 - 10.1 MG/DL Final     Assessment/Plan & Orders:         ICD-10-CM ICD-9-CM    1. Transaminitis R74.0 790.4 HEPATIC FUNCTION PANEL   2. Obesity, Class III, BMI 40-49.9 (morbid obesity) (HCC) E66.01 278.01    3. Insomnia, unspecified type G47.00 780.52    4. Anxiety F41.9 300.00    5. 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   Pt has a prescription for tetanus shot  Pap done by gyn. Will get note    Follow-up Disposition:  Return in about 3 months (around 02/12/2016) for Go over lab/imaging results, Follow up.    *Patient verbalized understanding and agreement with the plan.  Patient was given an after-visit summary.    WeSatira MccallumAlband, MD - Internal Medicine  11/12/2015, 3:55 PM  [facility] [id number] [phone] [phone]

## 2015-11-12 NOTE — Progress Notes (Signed)
Chief Complaint   Patient presents with   ??? Results     Visit Vitals   ??? BP 119/72 (BP 1 Location: Right arm, BP Patient Position: Sitting)   ??? Pulse 80   ??? Temp 98.2 ??F (36.8 ??C) (Oral)   ??? Resp 12   ??? Ht 5\' 6"  (1.676 m)   ??? Wt 249 lb 12.8 oz (113.3 kg)   ??? SpO2 98%   ??? BMI 40.32 kg/m2        Patient in room # 3.  Patient is not fasting.    1. Have you been to the ER, urgent care clinic since your last visit?  Hospitalized since your last visit?No    2. Have you seen or consulted any other health care providers outside of the [facility] since your last visit?  Include any pap smears or colon screening. Yes When: 10/30/2015 [facility] Reason for visit: Pap    HM Reviewed.

## 2015-11-12 NOTE — Patient Instructions (Addendum)
Anxiety Disorder: Care Instructions  Your Care Instructions  Anxiety is a normal reaction to stress. Difficult situations can cause you to have symptoms such as sweaty palms and a nervous feeling.  In an anxiety disorder, the symptoms are far more severe. Constant worry, muscle tension, trouble sleeping, nausea and diarrhea, and other symptoms can make normal daily activities difficult or impossible. These symptoms may occur for no reason, and they can affect your work, school, or social life. Medicines, counseling, and self-care can all help.  Follow-up care is a key part of your treatment and safety. Be sure to make and go to all appointments, and call your doctor if you are having problems. It's also a good idea to know your test results and keep a list of the medicines you take.  How can you care for yourself at home?  ?? Take medicines exactly as directed. Call your doctor if you think you are having a problem with your medicine.  ?? Go to your counseling sessions and follow-up appointments.  ?? Recognize and accept your anxiety. Then, when you are in a situation that makes you anxious, say to yourself, "This is not an emergency. I feel uncomfortable, but I am not in danger. I can keep going even if I feel anxious."  ?? Be kind to your body:  ?? Relieve tension with exercise or a massage.  ?? Get enough rest.  ?? Avoid alcohol, caffeine, nicotine, and illegal drugs. They can increase your anxiety level and cause sleep problems.  ?? Learn and do relaxation techniques. See below for more about these techniques.  ?? Engage your mind. Get out and do something you enjoy. Go to a funny movie, or take a walk or hike. Plan your day. Having too much or too little to do can make you anxious.  ?? Keep a record of your symptoms. Discuss your fears with a good friend or family member, or join a support group for people with similar problems. Talking to others sometimes relieves stress.   ?? Get involved in social groups, or volunteer to help others. Being alone sometimes makes things seem worse than they are.  ?? Get at least 30 minutes of exercise on most days of the week to relieve stress. Walking is a good choice. You also may want to do other activities, such as running, swimming, cycling, or playing tennis or team sports.  Relaxation techniques  Do relaxation exercises 10 to 20 minutes a day. You can play soothing, relaxing music while you do them, if you wish.  ?? Tell others in your house that you are going to do your relaxation exercises. Ask them not to disturb you.  ?? Find a comfortable place, away from all distractions and noise.  ?? Lie down on your back, or sit with your back straight.  ?? Focus on your breathing. Make it slow and steady.  ?? Breathe in through your nose. Breathe out through either your nose or mouth.  ?? Breathe deeply, filling up the area between your navel and your rib cage. Breathe so that your belly goes up and down.  ?? Do not hold your breath.  ?? Breathe like this for 5 to 10 minutes. Notice the feeling of calmness throughout your whole body.  As you continue to breathe slowly and deeply, relax by doing the following for another 5 to 10 minutes:  ?? Tighten and relax each muscle group in your body. You can begin at your toes and work your   way up to your head.  ?? Imagine your muscle groups relaxing and becoming heavy.  ?? Empty your mind of all thoughts.  ?? Let yourself relax more and more deeply.  ?? Become aware of the state of calmness that surrounds you.  ?? When your relaxation time is over, you can bring yourself back to alertness by moving your fingers and toes and then your hands and feet and then stretching and moving your entire body. Sometimes people fall asleep during relaxation, but they usually wake up shortly afterward.  ?? Always give yourself time to return to full alertness before you drive a  car or do anything that might cause an accident if you are not fully alert. Never play a relaxation tape while you drive a car.  When should you call for help?  Call 911 anytime you think you may need emergency care. For example, call if:  ?? You feel you cannot stop from hurting yourself or someone else.  Keep the numbers for these national suicide hotlines: 1-800-273-TALK (1-800-273-8255) and 1-800-SUICIDE (1-800-784-2433). If you or someone you know talks about suicide or feeling hopeless, get help right away.  Watch closely for changes in your health, and be sure to contact your doctor if:  ?? You have anxiety or fear that affects your life.  ?? You have symptoms of anxiety that are new or different from those you had before.  Where can you learn more?  Go to http://www.healthwise.net/[facility].  Enter P754 in the search box to learn more about "Anxiety Disorder: Care Instructions."  Current as of: August 21, 2014  Content Version: 11.3  ?? 2006-2017 [employer]. Care instructions adapted under license by [employer] (which disclaims liability or warranty for this information). If you have questions about a medical condition or this instruction, always ask your healthcare professional. [employer] Inc[employer] disclaims any warranty or liability for your use of this information.

## 2016-02-13 ENCOUNTER — Ambulatory Visit
Admit: 2016-02-13 | Discharge: 2016-02-13 | Payer: PRIVATE HEALTH INSURANCE | Attending: Internal Medicine | Primary: Internal Medicine

## 2016-02-13 DIAGNOSIS — M545 Low back pain, unspecified: Secondary | ICD-10-CM

## 2016-02-13 MED ORDER — NAPROXEN 500 MG TAB
500 mg | ORAL_TABLET | Freq: Two times a day (BID) | ORAL | 3 refills | Status: AC | PRN
Start: 2016-02-13 — End: ?

## 2016-02-13 NOTE — Progress Notes (Signed)
Internal Medicine Progress Note    Today's Date:  02/13/2016   Patient:  Kristina Baker  Patient DOB:  07/25/1990    Subjective:     Chief Complaint   Patient presents with   ??? Tailbone Pain     fell 2wks ago   ??? Anxiety   ??? Depression   ??? Insomnia   ??? Obesity      Sacrum pain  This is a new problem. This is not controlled. This has been present for two weeks.  Pt fell down the stairs. Pt has been taking OTC ibuprofen and sitting on a pillow.     Transaminitis  This is a new problem. This is not at goal. CT scan in the hospital showed possible hepatic steatosis. Pt has abdominal pain and diarrhea that she attributes to IBS.   ??  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 This is not at goal. Pt does not exercise regularly. Pt tries to eat a healthy diet.   ??  Anxiety/Depression/Insomnia  This is a chronic problem. This is controlled. Pt takes trazodone, wellbutrin, effexor and prn ativan. Pt sees a psychiatrist.     Past Medical History:   Diagnosis Date   ??? ADD (attention deficit disorder)    ??? Anxiety    ??? Depressed    ??? IBS (irritable bowel syndrome) 2012   ??? Normocytic anemia 10/15/2015   ??? Obesity, Class II, BMI 35-39.9 10/15/2015   ??? Obesity, Class III, BMI 40-49.9 (morbid obesity) (HCC) 11/12/2015   ??? PTSD (post-traumatic stress disorder) 2008   ??? Transaminitis 11/12/2015     Past Surgical History:   Procedure Laterality Date   ??? HX ACL RECONSTRUCTION Left 2007    Left knee   ??? HX CHOLECYSTECTOMY  2011   ??? HX WISDOM TEETH EXTRACTION  2009      reports that she has never smoked. She has never used smokeless tobacco. She reports that she drinks about 1.2 oz of alcohol per week  She reports that she does not use illicit drugs.  Family History   Problem Relation Age of Onset   ??? No Known Problems Mother    ??? Diabetes Father    ??? Hypertension Father    ??? Heart defect Sister      bicuspid valve   ??? Cancer Sister      leukemia     Allergies   Allergen Reactions   ??? Lactose Itching     Review of Systems    Positives in bold  CV:      chest pain, palpitations  PULM:  SOB, wheezing, cough, sputum production    Current Outpatient Meds and Allergies     Current Outpatient Prescriptions on File Prior to Visit   Medication Sig Dispense Refill   ??? CRYSELLE, 28, 0.3-30 mg-mcg tab Take 1 Tab by mouth daily.  2   ??? venlafaxine-SR (EFFEXOR-XR) 150 mg capsule Take 1 Cap by mouth daily.  1   ??? DICYCLOMINE HCL (DICYCLOMINE PO) Take 1 Tab by mouth daily.     ??? buPROPion XL (WELLBUTRIN XL) 150 mg tablet Take 150 mg by mouth every morning.     ??? traZODone (DESYREL) 50 mg tablet Take 50 mg by mouth nightly.     ??? LORazepam (ATIVAN) 0.5 mg tablet Take 0.5 mg by mouth every eight (8) hours as needed.       No current facility-administered medications on file prior to visit.  Allergies   Allergen Reactions   ??? Lactose Itching     Objective:     VS:    Visit Vitals   ??? BP 125/85 (BP 1 Location: Right arm, BP Patient Position: Sitting)   ??? Pulse 87   ??? Temp 98.6 ??F (37 ??C) (Oral)   ??? Resp 16   ??? Ht 5\' 6"  (1.676 m)   ??? Wt 253 lb (114.8 kg)   ??? LMP 01/17/2016   ??? SpO2 94%   ??? BMI 40.84 kg/m2     General:   Well-nourished, well-groomed, pleasant, alert, in no acute distress  Head:  Normocephalic, atraumatic  Ears:  External ears WNL  Nose:  External nares WNL  Psych:  No pressured speech, no abnormal thought content    PHQ over the last two weeks 10/15/2015   Little interest or pleasure in doing things Not at all   Feeling down, depressed or hopeless Not at all   Total Score PHQ 2 0     Abstract on 11/14/2015   Component Date Value Ref Range Status   ??? Pap Smear, External 10/29/2015 Normal   Final    see media     Assessment/Plan & Orders:         ICD-10-CM ICD-9-CM    1. Acute midline low back pain without sciatica M54.5 724.2 naproxen (NAPROSYN) 500 mg tablet   2. Obesity, Class III, BMI 40-49.9 (morbid obesity) (HCC) E66.01 278.01    3. Hepatic steatosis K76.0 571.8    4. Transaminitis R74.0 790.4        Healthy lifestyle has been encouraged including avoidance of tobacco, limiting or avoiding alcohol intake, heart healthy diet which is low in cholesterol and saturated fat and contains fresh fruits, vegetables and whole grains and fiber, regular exercise with goals of 20-30 minutes 3-5 days weekly and maintaining an optimal BMI.   Pt has a prescription for tetanus shot  Depression screening: 10/15/15    Follow-up Disposition:  Return in about 3 months (around 05/13/2016) for Follow up.    *Patient verbalized understanding and agreement with the plan.  Patient was given an after-visit summary.    Fonnie Mu. Enolia Koepke, MD - Internal Medicine  02/13/2016, 3:55 PM  [facility]  [address] [place], [place] [address]  Phone [phone]  Fax [phone]    Diagnoses and all orders for this visit:    1. Acute midline low back pain without sciatica  -     naproxen (NAPROSYN) 500 mg tablet; Take 1 Tab by mouth two (2) times daily as needed.    2. Obesity, Class III, BMI 40-49.9 (morbid obesity) (HCC)  Assessment & Plan:  Uncontrolled, based on history, physical exam and review of pertinent labs, studies and medications; meds reconciled; continue current treatment plan.  Key Obesity Meds     Patient is on no anti-obesity meds.        Lab Results   Component Value Date/Time    Hemoglobin A1c 5.1 10/29/2015 10:14 AM    Glucose 87 10/29/2015 10:13 AM    Cholesterol, total 127 10/29/2015 10:13 AM    HDL Cholesterol 47 10/29/2015 10:13 AM    LDL, calculated 60.4 10/29/2015 10:13 AM    Triglyceride 98 10/29/2015 10:13 AM    TSH 2.46 10/29/2015 10:13 AM    Sodium 139 10/29/2015 10:13 AM    Potassium 4.3 10/29/2015 10:13 AM    ALT (SGPT) 94 10/29/2015 10:13 AM  AST (SGOT) 36 10/29/2015 10:13 AM             3. Hepatic steatosis    4. Transaminitis

## 2016-02-13 NOTE — Progress Notes (Signed)
Kristina Baker is a 26 y.o.  female presents today for office visit for follow up. Pt would also like to discuss lab results. Pt is not fasting. Pt is in Room # 2      1. Have you been to the ER, urgent care clinic since your last visit?  Hospitalized since your last visit?No    2. Have you seen or consulted any other health care providers outside of the [facility] since your last visit?  Include any pap smears or colon screening. No    Upcoming Appts  none    Health Maintenance reviewed     VORB: No orders of the defined types were placed in this encounter.  Harvest ForestWendy F Alband, MDCourtney Delorse LekM Jader Desai, LPN

## 2016-02-13 NOTE — Patient Instructions (Addendum)
Starting a Weight Loss Plan: Care Instructions  Your Care Instructions    If you are thinking about losing weight, it can be hard to know where to start. Your doctor can help you set up a weight loss plan that best meets your needs. You may want to take a class on nutrition or exercise, or join a weight loss support group. If you have questions about how to make changes to your eating or exercise habits, ask your doctor about seeing a registered dietitian or an exercise specialist.  It can be a big challenge to lose weight. But you do not have to make huge changes at once. Make small changes, and stick with them. When those changes become habit, add a few more changes.  If you do not think you are ready to make changes right now, try to pick a date in the future. Make an appointment to see your doctor to discuss whether the time is right for you to start a plan.  Follow-up care is a key part of your treatment and safety. Be sure to make and go to all appointments, and call your doctor if you are having problems. It's also a good idea to know your test results and keep a list of the medicines you take.  How can you care for yourself at home?  ?? Set realistic goals. Many people expect to lose much more weight than is likely. A weight loss of 5% to 10% of your body weight may be enough to improve your health.  ?? Get family and friends involved to provide support. Talk to them about why you are trying to lose weight, and ask them to help. They can help by participating in exercise and having meals with you, even if they may be eating something different.  ?? Find what works best for you. If you do not have time or do not like to cook, a program that offers meal replacement bars or shakes may be better for you. Or if you like to prepare meals, finding a plan that includes daily menus and recipes may be best.  ?? Ask your doctor about other health professionals who can help you achieve your weight loss goals.   ?? A dietitian can help you make healthy changes in your diet.  ?? An exercise specialist or personal trainer can help you develop a safe and effective exercise program.  ?? A counselor or psychiatrist can help you cope with issues such as depression, anxiety, or family problems that can make it hard to focus on weight loss.  ?? Consider joining a support group for people who are trying to lose weight. Your doctor can suggest groups in your area.  Where can you learn more?  Go to http://www.healthwise.net/GoodHelp[employer]ctions.  Enter U357 in the search box to learn more about "Starting a Weight Loss Plan: Care Instructions."  Current as of: November 08, 2014  Content Version: 11.4  ?? 2006-2017 [employer]. Care instructions adapted under license by [employer] (which disclaims liability or warranty for this information). If you have questions about a medical condition or this instruction, always ask your healthcare professional. [employer]wise[employer] Inc[employer] disclaims any warranty or liability for your use of this information.

## 2016-02-13 NOTE — Assessment & Plan Note (Signed)
Uncontrolled, based on history, physical exam and review of pertinent labs, studies and medications; meds reconciled; continue current treatment plan.  Key Obesity Meds     Patient is on no anti-obesity meds.        Lab Results   Component Value Date/Time    Hemoglobin A1c 5.1 10/29/2015 10:14 AM    Glucose 87 10/29/2015 10:13 AM    Cholesterol, total 127 10/29/2015 10:13 AM    HDL Cholesterol 47 10/29/2015 10:13 AM    LDL, calculated 60.4 10/29/2015 10:13 AM    Triglyceride 98 10/29/2015 10:13 AM    TSH 2.46 10/29/2015 10:13 AM    Sodium 139 10/29/2015 10:13 AM    Potassium 4.3 10/29/2015 10:13 AM    ALT (SGPT) 94 10/29/2015 10:13 AM    AST (SGOT) 36 10/29/2015 10:13 AM

## 2016-05-13 ENCOUNTER — Ambulatory Visit
Admit: 2016-05-13 | Discharge: 2016-05-13 | Payer: PRIVATE HEALTH INSURANCE | Attending: Internal Medicine | Primary: Internal Medicine

## 2016-05-13 DIAGNOSIS — F320 Major depressive disorder, single episode, mild: Secondary | ICD-10-CM

## 2016-05-13 NOTE — Assessment & Plan Note (Signed)
Stable, based on history, physical exam and review of pertinent labs, studies and medications; meds reconciled; continue current treatment plan.  Key Psychotherapeutic Meds             venlafaxine-SR (EFFEXOR-XR) 150 mg capsule  (Taking) Take 1 Cap by mouth daily.    buPROPion XL (WELLBUTRIN XL) 150 mg tablet  (Taking) Take 150 mg by mouth every morning.    traZODone (DESYREL) 50 mg tablet  (Taking) Take 50 mg by mouth nightly.    LORazepam (ATIVAN) 0.5 mg tablet  (Taking) Take 0.5 mg by mouth every eight (8) hours as needed.        Other Key Behavioral Health Meds     The patient is on no other behavioral health meds.        Lab Results   Component Value Date/Time    Sodium 139 10/29/2015 10:13 AM    Creatinine 0.73 10/29/2015 10:13 AM    TSH 2.46 10/29/2015 10:13 AM    WBC 9.4 10/29/2015 10:13 AM    ALT (SGPT) 94 10/29/2015 10:13 AM    AST (SGOT) 36 10/29/2015 10:13 AM

## 2016-05-13 NOTE — Assessment & Plan Note (Signed)
Uncontrolled, based on history, physical exam and review of pertinent labs, studies and medications; meds reconciled; lifestyle modifications recommended.  Key Obesity Meds     Patient is on no anti-obesity meds.        Lab Results   Component Value Date/Time    Hemoglobin A1c 5.1 10/29/2015 10:14 AM    Glucose 87 10/29/2015 10:13 AM    Cholesterol, total 127 10/29/2015 10:13 AM    HDL Cholesterol 47 10/29/2015 10:13 AM    LDL, calculated 60.4 10/29/2015 10:13 AM    Triglyceride 98 10/29/2015 10:13 AM    TSH 2.46 10/29/2015 10:13 AM    Sodium 139 10/29/2015 10:13 AM    Potassium 4.3 10/29/2015 10:13 AM    ALT (SGPT) 94 10/29/2015 10:13 AM    AST (SGOT) 36 10/29/2015 10:13 AM

## 2016-05-13 NOTE — Progress Notes (Signed)
Internal Medicine Progress Note    Today's Date:  05/13/2016   Patient:  Kristina Baker  Patient DOB:  1990/02/21    Subjective:     Chief Complaint   Patient presents with   ??? Weight Management   ??? Migraine      Transaminitis  This is a new problem. This is improving but not at goal. CT scan in the hospital showed possible hepatic steatosis. Pt has a GI doctor, Dr. Janeann Forehand.   ??  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 This is not at goal. Pt does not exercise regularly. Pt eats a healthy diet.   ??  Anxiety/Depression/Insomnia  This is a chronic problem. This is controlled. Pt takes trazodone, wellbutrin, effexor and prn ativan. Pt sees a psychiatrist.     Headache  This is a chronic problem. This is not at goal. Pt reports two headaches in the last month. Pt takes prn alleve.     Past Medical History:   Diagnosis Date   ??? ADD (attention deficit disorder)    ??? Anxiety    ??? Depressed    ??? IBS (irritable bowel syndrome) 2012   ??? Mild single current episode of major depressive disorder (HCC) 05/13/2016   ??? Normocytic anemia 10/15/2015   ??? Obesity, Class II, BMI 35-39.9 10/15/2015   ??? Obesity, Class III, BMI 40-49.9 (morbid obesity) (HCC) 11/12/2015   ??? PTSD (post-traumatic stress disorder) 2008   ??? Transaminitis 11/12/2015     Past Surgical History:   Procedure Laterality Date   ??? HX ACL RECONSTRUCTION Left 2007    Left knee   ??? HX CHOLECYSTECTOMY  2011   ??? HX WISDOM TEETH EXTRACTION  2009      reports that she has never smoked. She has never used smokeless tobacco. She reports that she drinks about 1.2 oz of alcohol per week  She reports that she does not use illicit drugs.  Family History   Problem Relation Age of Onset   ??? No Known Problems Mother    ??? Diabetes Father    ??? Hypertension Father    ??? Heart defect Sister      bicuspid valve   ??? Cancer Sister      leukemia     Allergies   Allergen Reactions   ??? Lactose Itching   ??? Nickel Rash     Review of Systems   Positives in bold  CV:      chest pain, palpitations   PULM:  SOB, wheezing, cough, sputum production    Current Outpatient Meds and Allergies     Current Outpatient Prescriptions on File Prior to Visit   Medication Sig Dispense Refill   ??? naproxen (NAPROSYN) 500 mg tablet Take 1 Tab by mouth two (2) times daily as needed. 180 Tab 3   ??? CRYSELLE, 28, 0.3-30 mg-mcg tab Take 1 Tab by mouth daily.  2   ??? venlafaxine-SR (EFFEXOR-XR) 150 mg capsule Take 1 Cap by mouth daily.  1   ??? DICYCLOMINE HCL (DICYCLOMINE PO) Take 1 Tab by mouth daily.     ??? buPROPion XL (WELLBUTRIN XL) 150 mg tablet Take 150 mg by mouth every morning.     ??? traZODone (DESYREL) 50 mg tablet Take 50 mg by mouth nightly.     ??? LORazepam (ATIVAN) 0.5 mg tablet Take 0.5 mg by mouth every eight (8) hours as needed.       No current facility-administered medications on file prior  to visit.      Allergies   Allergen Reactions   ??? Lactose Itching   ??? Nickel Rash     Objective:     VS:    Visit Vitals   ??? BP 121/80 (BP 1 Location: Right arm, BP Patient Position: Sitting)   ??? Pulse 89   ??? Temp 98.6 ??F (37 ??C) (Oral)   ??? Resp 15   ??? Ht  (1.676 m)   ??? Wt 248 lb 3.2 oz (112.6 kg)   ??? LMP 05/06/2016 (Exact Date)   ??? SpO2 97%   ??? BMI 40.06 kg/m2     General:   Well-nourished, well-groomed, pleasant, alert, in no acute distress  Head:  Normocephalic, atraumatic  Ears:  External ears WNL  Nose:  External nares WNL  Psych:  No pressured speech, no abnormal thought content    PHQ over the last two weeks 10/15/2015   Little interest or pleasure in doing things Not at all   Feeling down, depressed or hopeless Not at all   Total Score PHQ 2 0     No visits with results within 3 Month(s) from this visit.  Latest known visit with results is:    Abstract on 11/14/2015   Component Date Value Ref Range Status   ??? Pap Smear, External 10/29/2015 Normal   Final    see media     Assessment/Plan & Orders:         ICD-10-CM ICD-9-CM    1. Mild single current episode of major depressive disorder (HCC) F32.0 296.21     2. Obesity, Class III, BMI 40-49.9 (morbid obesity) (HCC) E66.01 278.01    3. Hepatic steatosis K76.0 571.8    4. Transaminitis R74.0 790.4       Healthy lifestyle has been encouraged including avoidance of tobacco, limiting or avoiding alcohol intake, heart healthy diet which is low in cholesterol and saturated fat and contains fresh fruits, vegetables and whole grains and fiber, regular exercise with goals of 20-30 minutes 3-5 days weekly and maintaining an optimal BMI.   Pt has a prescription for tetanus shot  Information given on ketogenic diet  Follow up with GI  Depression screening: 10/15/15    Follow-up Disposition:  Return in about 3 months (around 08/12/2016) for Follow up weight management.    *Patient verbalized understanding and agreement with the plan.  Patient was given an after-visit summary.    Fonnie Mu. Sarp Vernier, MD - Internal Medicine  05/13/2016, 3:55 PM  [facility]  [address] [place], [place] [address]  Phone [phone]  Fax [phone]    Diagnoses and all orders for this visit:    1. Mild single current episode of major depressive disorder [facility])  Assessment & Plan:  Stable, based on history, physical exam and review of pertinent labs, studies and medications; meds reconciled; continue current treatment plan.  Key Psychotherapeutic Meds             venlafaxine-SR (EFFEXOR-XR) 150 mg capsule  (Taking) Take 1 Cap by mouth daily.    buPROPion XL (WELLBUTRIN XL) 150 mg tablet  (Taking) Take 150 mg by mouth every morning.    traZODone (DESYREL) 50 mg tablet  (Taking) Take 50 mg by mouth nightly.    LORazepam (ATIVAN) 0.5 mg tablet  (Taking) Take 0.5 mg by mouth every eight (8) hours as needed.        Other Key Behavioral Health Meds  The patient is on no other behavioral health meds.        Lab Results   Component Value Date/Time    Sodium 139 10/29/2015 10:13 AM    Creatinine 0.73 10/29/2015 10:13 AM    TSH 2.46 10/29/2015 10:13 AM    WBC 9.4 10/29/2015 10:13 AM     ALT (SGPT) 94 10/29/2015 10:13 AM    AST (SGOT) 36 10/29/2015 10:13 AM         2. Obesity, Class III, BMI 40-49.9 (morbid obesity) (HCC)  Assessment & Plan:  Uncontrolled, based on history, physical exam and review of pertinent labs, studies and medications; meds reconciled; lifestyle modifications recommended.  Key Obesity Meds     Patient is on no anti-obesity meds.        Lab Results   Component Value Date/Time    Hemoglobin A1c 5.1 10/29/2015 10:14 AM    Glucose 87 10/29/2015 10:13 AM    Cholesterol, total 127 10/29/2015 10:13 AM    HDL Cholesterol 47 10/29/2015 10:13 AM    LDL, calculated 60.4 10/29/2015 10:13 AM    Triglyceride 98 10/29/2015 10:13 AM    TSH 2.46 10/29/2015 10:13 AM    Sodium 139 10/29/2015 10:13 AM    Potassium 4.3 10/29/2015 10:13 AM    ALT (SGPT) 94 10/29/2015 10:13 AM    AST (SGOT) 36 10/29/2015 10:13 AM             3. Hepatic steatosis    4. Transaminitis

## 2016-05-13 NOTE — Patient Instructions (Signed)
Information given on ketogenic diet

## 2016-05-13 NOTE — Progress Notes (Signed)
Kristina Baker is a 26 y.o.  female presents today for office visit for   Chief Complaint   Patient presents with   ??? Follow-up   ??? Migraine   Pt is not fasting. Pt is in Room # 3      1. Have you been to the ER, urgent care clinic since your last visit?  Hospitalized since your last visit?Yes [facility] for UTI 04/04/16    2. Have you seen or consulted any other health care providers outside of the [facility] since your last visit?  Include any pap smears or colon screening. No    Health Maintenance reviewed - Pt had Tdap/TD throught her employer, [employer].  Requested Prescriptions      No prescriptions requested or ordered in this encounter     Visit Vitals   ??? BP 121/80 (BP 1 Location: Right arm, BP Patient Position: Sitting)   ??? Pulse 89   ??? Temp 98.6 ??F (37 ??C) (Oral)   ??? Resp 15   ??? Ht  (1.676 m)   ??? Wt 248 lb 3.2 oz (112.6 kg)   ??? LMP 05/06/2016 (Exact Date)   ??? SpO2 97%   ??? BMI 40.06 kg/m2         Upcoming Appts  none    VORB: No orders of the defined types were placed in this encounter.  /Wendy Earlie Counts, MD/Sadiq Mccauley Rueben Bash , LPN

## 2016-08-18 ENCOUNTER — Encounter: Attending: Internal Medicine | Primary: Internal Medicine

## 2017-03-03 ENCOUNTER — Inpatient Hospital Stay: Admit: 2017-03-03 | Primary: Internal Medicine

## 2017-03-05 LAB — HIV 1/2 AG/AB, 4TH GENERATION,W RFLX CONFIRM: HIV 1/2 Interpretation: NONREACTIVE

## 2017-03-05 LAB — HIV 1/2 ANTIGEN/ANTIBODY, FOURTH GENERATION W/RFL: Interpretation: NONREACTIVE

## 2017-04-17 ENCOUNTER — Inpatient Hospital Stay
Admit: 2017-04-17 | Discharge: 2017-04-17 | Disposition: A | Discharge status: Home / Self Care | Payer: PRIVATE HEALTH INSURANCE | Attending: Emergency Medicine

## 2017-04-17 DIAGNOSIS — Z760 Encounter for issue of repeat prescription: Secondary | ICD-10-CM

## 2017-04-17 MED ORDER — VENLAFAXINE SR 150 MG 24 HR CAP
150 mg | ORAL_CAPSULE | Freq: Every day | ORAL | 0 refills | Status: AC
Start: 2017-04-17 — End: 2017-05-01

## 2017-04-17 NOTE — ED Notes (Signed)
I have reviewed discharge instructions with the patient.  The patient verbalized understanding.  Patient armband removed and shredded.  1 prescription given.  All questions answered.  Pt dcd to home awake. Alert and in NAD

## 2017-04-17 NOTE — ED Notes (Signed)
Report to casey rn. All questions answered.

## 2017-04-17 NOTE — ED Provider Notes (Signed)
EMERGENCY DEPARTMENT HISTORY AND PHYSICAL EXAM    Date: 04/17/2017  Patient Name: Kristina Baker    History of Presenting Illness     Chief Complaint   Patient presents with   ??? Medication Refill         History Provided By: Patient    Chief Complaint: Medication refill  Duration: 3 days  Timing: Acute  Location: N/A  Quality: N/A  Severity: Mild  Modifying Factors: Running out of medication  Associated Symptoms: none       Additional History (Context): Kristina Baker is a 27 y.o. female with a history of ADD, IBS, anxiety, depression, and PTSD who presents with a 3-day history running low on depression medication.  Patient is on Effexor 150 mg capsules once a day.  Reports PCP was unwilling to refill medication without an appointment, patient does have appointment for this coming Wednesday.  Patient denies any SI or HI.      PCP: Harvest ForestAlband, Wendy F, MD    Current Outpatient Medications   Medication Sig Dispense Refill   ??? venlafaxine-SR (EFFEXOR XR) 150 mg capsule Take 1 Cap by mouth daily for 14 days. 14 Cap 0   ??? naproxen (NAPROSYN) 500 mg tablet Take 1 Tab by mouth two (2) times daily as needed. 180 Tab 3   ??? CRYSELLE, 28, 0.3-30 mg-mcg tab Take 1 Tab by mouth daily.  2   ??? venlafaxine-SR (EFFEXOR-XR) 150 mg capsule Take 1 Cap by mouth daily.  1   ??? DICYCLOMINE HCL (DICYCLOMINE PO) Take 1 Tab by mouth daily.     ??? buPROPion XL (WELLBUTRIN XL) 150 mg tablet Take 150 mg by mouth every morning.     ??? traZODone (DESYREL) 50 mg tablet Take 50 mg by mouth nightly.     ??? LORazepam (ATIVAN) 0.5 mg tablet Take 0.5 mg by mouth every eight (8) hours as needed.         Past History     Past Medical History:  Past Medical History:   Diagnosis Date   ??? ADD (attention deficit disorder)    ??? Anxiety    ??? Depressed    ??? IBS (irritable bowel syndrome) 2012   ??? Mild single current episode of major depressive disorder (HCC) 05/13/2016   ??? Normocytic anemia 10/15/2015   ??? Obesity, Class II, BMI 35-39.9 10/15/2015    ??? Obesity, Class III, BMI 40-49.9 (morbid obesity) (HCC) 11/12/2015   ??? PTSD (post-traumatic stress disorder) 2008   ??? Transaminitis 11/12/2015       Past Surgical History:  Past Surgical History:   Procedure Laterality Date   ??? HX ACL RECONSTRUCTION Left 2007    Left knee   ??? HX CHOLECYSTECTOMY  2011   ??? HX WISDOM TEETH EXTRACTION  2009       Family History:  Family History   Problem Relation Age of Onset   ??? No Known Problems Mother    ??? Diabetes Father    ??? Hypertension Father    ??? Heart defect Sister         bicuspid valve   ??? Cancer Sister         leukemia       Social History:  Social History     Tobacco Use   ??? Smoking status: Never Smoker   ??? Smokeless tobacco: Never Used   Substance Use Topics   ??? Alcohol use: Yes     Alcohol/week: 1.2 oz     Types: 1  Glasses of wine, 1 Cans of beer per week     Comment: once or twice a month   ??? Drug use: No       Allergies:  Allergies   Allergen Reactions   ??? Lactose Itching   ??? Nickel Rash         Review of Systems   Review of Systems   Constitutional: Negative for chills and fever.   HENT: Negative for congestion, rhinorrhea and sore throat.    Respiratory: Negative for cough and shortness of breath.    Cardiovascular: Negative for chest pain.   Gastrointestinal: Negative for abdominal pain, blood in stool, constipation, diarrhea, nausea and vomiting.   Genitourinary: Negative for dysuria, frequency and hematuria.   Musculoskeletal: Negative for back pain and myalgias.   Skin: Negative for rash and wound.   Neurological: Negative for dizziness and headaches.   Psychiatric/Behavioral: Negative.    All other systems reviewed and are negative.    All Other Systems Negative  Physical Exam     Vitals:    04/17/17 1913   BP: (!) 150/110   Pulse: (!) 105   Resp: 16   Temp: 98 ??F (36.7 ??C)   SpO2: 100%     Physical Exam   Constitutional: She is oriented to person, place, and time. She appears well-developed and well-nourished. No distress.   HENT:    Head: Normocephalic and atraumatic.   Eyes: Conjunctivae are normal.   Neck: Normal range of motion. Neck supple.   Cardiovascular: Normal rate, regular rhythm and normal heart sounds.   Pulmonary/Chest: Effort normal and breath sounds normal. No respiratory distress. She exhibits no tenderness.   Abdominal: Soft. Bowel sounds are normal. She exhibits no distension. There is no tenderness. There is no rebound and no guarding.   Musculoskeletal: She exhibits no edema or deformity.   Neurological: She is alert and oriented to person, place, and time.   Skin: Skin is warm and dry. She is not diaphoretic.   Psychiatric: She has a normal mood and affect. Her speech is normal and behavior is normal. Judgment and thought content normal. Cognition and memory are normal.   Nursing note and vitals reviewed.        Diagnostic Study Results     Labs -   No results found for this or any previous visit (from the past 12 hour(s)).    Radiologic Studies -   No orders to display     CT Results  (Last 48 hours)    None        CXR Results  (Last 48 hours)    None            Medical Decision Making   I am the first provider for this patient.    I reviewed the vital signs, available nursing notes, past medical history, past surgical history, family history and social history.    Vital Signs-Reviewed the patient's vital signs.      Pulse Oximetry Analysis -  100 % RA    Records Reviewed: Nursing Notes and Old Medical Records     Procedures: None   Procedures    Provider Notes (Medical Decision Making):     Differential: Medication refill, anxiety, depression, drug-seeking behavior    Plan: Patient did bring prescription bottle with her, last refill was last month.  Patient is a very reasonable, well-appearing female, has made a PCP appointment for next week, will refill small amount of medication to allow patient  time to get to primary care appointment.  I have stressed  the importance of staying on top of refills and primary care appointments as the ER is not the best place for medication refills of this nature. Patient agrees with the plan and management and states all questions have been thoroughly answered and there are no more remaining questions.         MED RECONCILIATION:  No current facility-administered medications for this encounter.      Current Outpatient Medications   Medication Sig   ??? venlafaxine-SR (EFFEXOR XR) 150 mg capsule Take 1 Cap by mouth daily for 14 days.   ??? naproxen (NAPROSYN) 500 mg tablet Take 1 Tab by mouth two (2) times daily as needed.   ??? CRYSELLE, 28, 0.3-30 mg-mcg tab Take 1 Tab by mouth daily.   ??? venlafaxine-SR (EFFEXOR-XR) 150 mg capsule Take 1 Cap by mouth daily.   ??? DICYCLOMINE HCL (DICYCLOMINE PO) Take 1 Tab by mouth daily.   ??? buPROPion XL (WELLBUTRIN XL) 150 mg tablet Take 150 mg by mouth every morning.   ??? traZODone (DESYREL) 50 mg tablet Take 50 mg by mouth nightly.   ??? LORazepam (ATIVAN) 0.5 mg tablet Take 0.5 mg by mouth every eight (8) hours as needed.       Disposition:  Home     DISCHARGE NOTE:   Pt has been reexamined. Patient has no new complaints, changes, or physical findings.  Care plan outlined and precautions discussed.  Results of workup were reviewed with the patient. All medications were reviewed with the patient. All of pt's questions and concerns were addressed. Patient was instructed and agrees to follow up with PCP as well as to return to the ED upon further deterioration. Patient is ready to go home.    Follow-up Information     Follow up With Specialties Details Why Contact Info    [facility] EMERGENCY DEPT Emergency Medicine  As needed [address]  [place] [address]  [phone]    Harvest Forest, MD Internal Medicine In 2 days  [address]  [facility]  [place] [address]  [phone]            Current Discharge Medication List      START taking these medications    Details    !! venlafaxine-SR (EFFEXOR XR) 150 mg capsule Take 1 Cap by mouth daily for 14 days.  Qty: 14 Cap, Refills: 0       !! - Potential duplicate medications found. Please discuss with provider.      CONTINUE these medications which have NOT CHANGED    Details   !! venlafaxine-SR (EFFEXOR-XR) 150 mg capsule Take 1 Cap by mouth daily.  Refills: 1       !! - Potential duplicate medications found. Please discuss with provider.              Diagnosis     Clinical Impression:   1. Medication refill    2. Depression, unspecified depression type

## 2017-04-17 NOTE — ED Triage Notes (Signed)
Pt arrived through triage with c/o needing medication refill.

## 2018-01-05 LAB — HM PAP SMEAR

## 2020-02-19 ENCOUNTER — Other Ambulatory Visit: Payer: Self-pay

## 2020-02-19 ENCOUNTER — Ambulatory Visit: Payer: PRIVATE HEALTH INSURANCE | Admitting: Internal Medicine

## 2020-02-19 ENCOUNTER — Encounter: Payer: Self-pay | Admitting: Internal Medicine

## 2020-02-19 VITALS — BP 126/82 | HR 101 | Temp 98.4°F | Ht 66.0 in | Wt 252.0 lb

## 2020-02-19 DIAGNOSIS — R000 Tachycardia, unspecified: Secondary | ICD-10-CM

## 2020-02-19 DIAGNOSIS — R7989 Other specified abnormal findings of blood chemistry: Secondary | ICD-10-CM | POA: Diagnosis not present

## 2020-02-19 DIAGNOSIS — E876 Hypokalemia: Secondary | ICD-10-CM | POA: Diagnosis not present

## 2020-02-19 DIAGNOSIS — Z0000 Encounter for general adult medical examination without abnormal findings: Secondary | ICD-10-CM | POA: Diagnosis not present

## 2020-02-19 DIAGNOSIS — G4452 New daily persistent headache (NDPH): Secondary | ICD-10-CM

## 2020-02-19 DIAGNOSIS — G43009 Migraine without aura, not intractable, without status migrainosus: Secondary | ICD-10-CM

## 2020-02-19 DIAGNOSIS — Z0001 Encounter for general adult medical examination with abnormal findings: Secondary | ICD-10-CM

## 2020-02-19 DIAGNOSIS — K7581 Nonalcoholic steatohepatitis (NASH): Secondary | ICD-10-CM

## 2020-02-19 LAB — CBC WITH DIFFERENTIAL/PLATELET
Basophils Absolute: 0.1 10*3/uL (ref 0.0–0.1)
Basophils Relative: 0.9 % (ref 0.0–3.0)
Eosinophils Absolute: 0.2 10*3/uL (ref 0.0–0.7)
Eosinophils Relative: 1.7 % (ref 0.0–5.0)
HCT: 38.1 % (ref 36.0–46.0)
Hemoglobin: 12.9 g/dL (ref 12.0–15.0)
Lymphocytes Relative: 36.4 % (ref 12.0–46.0)
Lymphs Abs: 3.5 10*3/uL (ref 0.7–4.0)
MCHC: 33.8 g/dL (ref 30.0–36.0)
MCV: 93.2 fl (ref 78.0–100.0)
Monocytes Absolute: 0.7 10*3/uL (ref 0.1–1.0)
Monocytes Relative: 7 % (ref 3.0–12.0)
Neutro Abs: 5.2 10*3/uL (ref 1.4–7.7)
Neutrophils Relative %: 54 % (ref 43.0–77.0)
Platelets: 333 10*3/uL (ref 150.0–400.0)
RBC: 4.08 Mil/uL (ref 3.87–5.11)
RDW: 12.3 % (ref 11.5–15.5)
WBC: 9.7 10*3/uL (ref 4.0–10.5)

## 2020-02-19 LAB — BASIC METABOLIC PANEL
BUN: 13 mg/dL (ref 6–23)
CO2: 25 mEq/L (ref 19–32)
Calcium: 9.1 mg/dL (ref 8.4–10.5)
Chloride: 103 mEq/L (ref 96–112)
Creatinine, Ser: 0.91 mg/dL (ref 0.40–1.20)
GFR: 83.94 mL/min (ref >60.00)
Glucose, Bld: 98 mg/dL (ref 70–99)
Potassium: 3.4 mEq/L — ABNORMAL LOW (ref 3.5–5.1)
Sodium: 135 mEq/L (ref 135–145)

## 2020-02-19 LAB — LIPID PANEL
Cholesterol: 125 mg/dL (ref 0–200)
HDL: 38.2 mg/dL — ABNORMAL LOW (ref >39.00)
LDL Cholesterol: 54 mg/dL (ref 0–99)
NonHDL: 87.14
Total CHOL/HDL Ratio: 3
Triglycerides: 164 mg/dL — ABNORMAL HIGH (ref 0.0–149.0)
VLDL: 32.8 mg/dL (ref 0.0–40.0)

## 2020-02-19 LAB — HEPATIC FUNCTION PANEL
ALT: 59 U/L — ABNORMAL HIGH (ref 0–35)
AST: 32 U/L (ref 0–37)
Albumin: 4.3 g/dL (ref 3.5–5.2)
Alkaline Phosphatase: 62 U/L (ref 39–117)
Bilirubin, Direct: 0.1 mg/dL (ref 0.0–0.3)
Total Bilirubin: 0.3 mg/dL (ref 0.2–1.2)
Total Protein: 7.8 g/dL (ref 6.0–8.3)

## 2020-02-19 LAB — TSH: TSH: 2.82 u[IU]/mL (ref 0.35–4.50)

## 2020-02-19 MED ORDER — UBRELVY 100 MG PO TABS: 1.0000 | ORAL_TABLET | Freq: Every day | ORAL | 1 refills | Status: DC | PRN

## 2020-02-19 MED ORDER — PROPRANOLOL HCL ER 60 MG PO CP24
60.0000 mg | ORAL_CAPSULE | Freq: Every day | ORAL | 0 refills | Status: DC
Start: 2020-02-19 — End: 2020-05-12

## 2020-02-19 MED ORDER — QULIPTA 60 MG PO TABS: 1.0000 | ORAL_TABLET | Freq: Every day | ORAL | 1 refills | Status: DC

## 2020-02-19 NOTE — Patient Instructions (Signed)

## 2020-02-19 NOTE — Progress Notes (Signed)
Subjective:  Patient ID: Erin Harris, female    DOB: [birth year]  Age: 30 y.o. MRN: [id number]  CC: Annual Exam and Headache  This visit occurred during the SARS-CoV-2 public health emergency.  Safety protocols were in place, including screening questions prior to the visit, additional usage of staff PPE, and extensive cleaning of exam room while observing appropriate contact time as indicated for disinfecting solutions.   NEW TO ME  HPI Erin Harris presents for a CPX.  She has had typical migraine headaches for at least 3 years.  She tells me that about 3-4 times a month she has a severe headache that can last for a few days.  She previously treated this with Imitrex but it caused palpitations.  She feels like she has been taking too much Motrin recently.  She has previously taken propanolol and metoprolol for prophylaxis and felt like propanolol was more effective than metoprolol.  The migraine headaches are associated with nausea and vomiting.  Over the last 2 months she has developed a new headache syndrome.  She describes a discomfort that is more in the frontal area and around her eyes.  She has a history of chronic tachycardia.  She also tells me that she has a history of nonalcoholic steatohepatitis.  History Erin Harris has a past medical history of Allergy, Depression, Frequent headaches, IBS (irritable bowel syndrome), Migraine, PTSD (post-traumatic stress disorder), and UTI (urinary tract infection).   She has a past surgical history that includes Cholecystectomy; Arthroscopic repair ACL; and Wisdom tooth extraction.   Her family history includes Alcohol abuse in her brother; Birth defects in her sister; Cancer in her paternal grandfather and sister; Diabetes in her father; Early death in her paternal grandmother; Hearing loss in her maternal grandfather and paternal grandfather; Heart disease in her maternal grandfather, paternal grandmother, and sister; Hyperlipidemia in her father;  Hypertension in her father; Migraines in her mother.She reports that she has never smoked. She has never used smokeless tobacco. She reports previous alcohol use. She reports that she does not use drugs.  Outpatient Medications Prior to Visit  Medication Sig Dispense Refill   buPROPion (WELLBUTRIN XL) 300 MG 24 hr tablet Take 300 mg by mouth daily.     Norgestrel-Ethinyl Estradiol (CRYSELLE-28 PO) Take by mouth.     traZODone (DESYREL) 50 MG tablet Take 50 mg by mouth at bedtime.     metoprolol tartrate (LOPRESSOR) 25 MG tablet Take 25 mg by mouth 2 (two) times daily.     No facility-administered medications prior to visit.    ROS Review of Systems  Objective:  BP 126/82    Pulse (!) 101    Temp 98.4 F (36.9 C) (Oral)    Ht 5' 6"  (1.676 m)    Wt 252 lb (114.3 kg)    LMP 01/23/2020    SpO2 99%    BMI 40.67 kg/m   Physical Exam Vitals reviewed.  Constitutional:      Appearance: Normal appearance.  HENT:     Nose: Nose normal.     Mouth/Throat:     Mouth: Mucous membranes are moist.  Eyes:     General: No scleral icterus.       Right eye: No discharge.        Left eye: No discharge.     Extraocular Movements: Extraocular movements intact.     Conjunctiva/sclera: Conjunctivae normal.     Pupils: Pupils are equal, round, and reactive to light.  Cardiovascular:  Rate and Rhythm: Regular rhythm. Tachycardia present.     Pulses: Normal pulses.     Heart sounds: No murmur heard. No gallop.   Pulmonary:     Effort: Pulmonary effort is normal.     Breath sounds: No stridor. No wheezing, rhonchi or rales.  Abdominal:     General: Abdomen is flat.     Palpations: There is no mass.     Tenderness: There is no abdominal tenderness. There is no guarding.  Musculoskeletal:        General: Normal range of motion.     Cervical back: Neck supple.     Right lower leg: No edema.     Left lower leg: No edema.  Lymphadenopathy:     Cervical: No cervical adenopathy.  Skin:     General: Skin is warm and dry.     Findings: No rash.  Neurological:     General: No focal deficit present.     Mental Status: She is alert and oriented to person, place, and time. Mental status is at baseline.     Cranial Nerves: Cranial nerves are intact.     Sensory: Sensation is intact.     Motor: Motor function is intact. No weakness or atrophy.     Coordination: Romberg sign negative. Coordination normal.     Deep Tendon Reflexes: Reflexes abnormal. Babinski sign absent on the right side. Babinski sign absent on the left side.     Reflex Scores:      Tricep reflexes are 1+ on the right side and 1+ on the left side.      Bicep reflexes are 0 on the right side and 0 on the left side.      Brachioradialis reflexes are 0 on the right side and 0 on the left side.      Patellar reflexes are 2+ on the right side and 3+ on the left side.      Achilles reflexes are 0 on the right side and 0 on the left side. Psychiatric:        Mood and Affect: Mood normal.        Behavior: Behavior normal.     Lab Results  Component Value Date   WBC 9.7 02/19/2020   HGB 12.9 02/19/2020   HCT 38.1 02/19/2020   PLT 333.0 02/19/2020   GLUCOSE 98 02/19/2020   CHOL 125 02/19/2020   TRIG 164.0 (H) 02/19/2020   HDL 38.20 (L) 02/19/2020   LDLCALC 54 02/19/2020   ALT 59 (H) 02/19/2020   AST 32 02/19/2020   NA 135 02/19/2020   K 3.4 (L) 02/19/2020   CL 103 02/19/2020   CREATININE 0.91 02/19/2020   BUN 13 02/19/2020   CO2 25 02/19/2020   TSH 2.82 02/19/2020    Assessment & Plan:   Erin Harris was seen today for annual exam and headache.  Diagnoses and all orders for this visit:  Tachycardia- Evaluation for secondary causes is negative.  Will restart propanolol. -     propranolol ER (INDERAL LA) 60 MG 24 hr capsule; Take 1 capsule (60 mg total) by mouth daily. -     Basic metabolic panel; Future -     TSH; Future -     Hepatic function panel; Future -     CBC with Differential/Platelet; Future -      CBC with Differential/Platelet -     Hepatic function panel -     TSH -     Basic metabolic  panel  Migraine without aura and without status migrainosus, not intractable- Tt sounds like the headaches are frequent and create a significant burden on her life.  I therefore recommended that she restart propanolol and to take a daily CGRP antagonist to prevent the development of headaches.  She will also use a CGRP antagonist to treat acute migraine. -     Atogepant (QULIPTA) 60 MG TABS; Take 1 tablet by mouth daily. -     propranolol ER (INDERAL LA) 60 MG 24 hr capsule; Take 1 capsule (60 mg total) by mouth daily. -     Ubrogepant (UBRELVY) 100 MG TABS; Take 1 tablet by mouth daily as needed.  Encounter for general adult medical examination with abnormal findings- Exam completed, labs reviewed, vaccines reviewed and updated, cervical cancer screening is up-to-date, patient education was given. -     Lipid panel; Future -     Hepatitis C antibody; Future -     HIV Antibody (routine testing w rflx); Future -     HIV Antibody (routine testing w rflx) -     Hepatitis C antibody -     Lipid panel  Elevated prolactin level- Her prolactin level is normal now. -     Prolactin; Future -     Prolactin  Hypokalemia, inadequate intake -     potassium chloride SA (KLOR-CON) 20 MEQ tablet; Take 1 tablet (20 mEq total) by mouth 2 (two) times daily.  Elevated LFTs -     Cancel: US Abdomen Limited RUQ (LIVER/GB); Future  NASH (nonalcoholic steatohepatitis)- She tells me that her liver enzymes are lower than they were years ago.  She will continue to work on her lifestyle modifications.  New daily persistent headache- Her patellar DTR's are asymmetrical. I recommended that she undergo an MRI of the brain to see if there is a mass, tumor, NPH, or bleed. -     MR Brain Wo Contrast; Future   I have discontinued Erin Harris's metoprolol tartrate. I am also having her start on Qulipta, propranolol ER,  Ubrelvy, and potassium chloride SA. Additionally, I am having her maintain her buPROPion, traZODone, and Norgestrel-Ethinyl Estradiol (CRYSELLE-28 PO).  Meds ordered this encounter  Medications   Atogepant (QULIPTA) 60 MG TABS    Sig: Take 1 tablet by mouth daily.    Dispense:  90 tablet    Refill:  1   propranolol ER (INDERAL LA) 60 MG 24 hr capsule    Sig: Take 1 capsule (60 mg total) by mouth daily.    Dispense:  90 capsule    Refill:  0   Ubrogepant (UBRELVY) 100 MG TABS    Sig: Take 1 tablet by mouth daily as needed.    Dispense:  30 tablet    Refill:  1   potassium chloride SA (KLOR-CON) 20 MEQ tablet    Sig: Take 1 tablet (20 mEq total) by mouth 2 (two) times daily.    Dispense:  180 tablet    Refill:  1     Follow-up: Return in about 3 months (around 05/19/2020).  Erin Calico, MD

## 2020-02-20 ENCOUNTER — Encounter: Payer: Self-pay | Admitting: Internal Medicine

## 2020-02-20 DIAGNOSIS — R7989 Other specified abnormal findings of blood chemistry: Secondary | ICD-10-CM | POA: Insufficient documentation

## 2020-02-20 DIAGNOSIS — E876 Hypokalemia: Secondary | ICD-10-CM | POA: Insufficient documentation

## 2020-02-20 LAB — HEPATITIS C ANTIBODY
Hepatitis C Ab: NONREACTIVE
SIGNAL TO CUT-OFF: 0.02 (ref <1.00)

## 2020-02-20 LAB — HIV ANTIBODY (ROUTINE TESTING W REFLEX): HIV 1&2 Ab, 4th Generation: NONREACTIVE

## 2020-02-20 LAB — PROLACTIN: Prolactin: 14.4 ng/mL

## 2020-02-20 MED ORDER — POTASSIUM CHLORIDE CRYS ER 20 MEQ PO TBCR: 20.0000 meq | EXTENDED_RELEASE_TABLET | Freq: Two times a day (BID) | ORAL | 1 refills | Status: DC

## 2020-02-21 ENCOUNTER — Encounter: Payer: Self-pay | Admitting: Internal Medicine

## 2020-02-21 DIAGNOSIS — K7581 Nonalcoholic steatohepatitis (NASH): Secondary | ICD-10-CM | POA: Insufficient documentation

## 2020-02-21 DIAGNOSIS — G4452 New daily persistent headache (NDPH): Secondary | ICD-10-CM | POA: Insufficient documentation

## 2020-03-05 ENCOUNTER — Telehealth: Payer: Self-pay

## 2020-03-05 NOTE — Telephone Encounter (Signed)
Key: [id number]

## 2020-03-09 ENCOUNTER — Other Ambulatory Visit: Payer: Self-pay

## 2020-03-09 ENCOUNTER — Ambulatory Visit
Admission: RE | Admit: 2020-03-09 | Discharge: 2020-03-09 | Disposition: A | Discharge status: Home / Self Care | Payer: PRIVATE HEALTH INSURANCE | Source: Ambulatory Visit | Attending: Internal Medicine | Admitting: Internal Medicine

## 2020-03-09 DIAGNOSIS — G4452 New daily persistent headache (NDPH): Secondary | ICD-10-CM

## 2020-03-11 ENCOUNTER — Encounter: Payer: Self-pay | Admitting: Internal Medicine

## 2020-03-12 ENCOUNTER — Other Ambulatory Visit: Payer: Self-pay | Admitting: Internal Medicine

## 2020-03-12 DIAGNOSIS — G43009 Migraine without aura, not intractable, without status migrainosus: Secondary | ICD-10-CM

## 2020-03-12 DIAGNOSIS — K582 Mixed irritable bowel syndrome: Secondary | ICD-10-CM

## 2020-03-12 MED ORDER — DICYCLOMINE HCL 10 MG PO CAPS: 10.0000 mg | ORAL_CAPSULE | Freq: Three times a day (TID) | ORAL | 0 refills | Status: DC

## 2020-04-22 ENCOUNTER — Other Ambulatory Visit: Payer: Self-pay | Admitting: Internal Medicine

## 2020-04-22 ENCOUNTER — Encounter: Payer: Self-pay | Admitting: Internal Medicine

## 2020-04-22 DIAGNOSIS — Z30011 Encounter for initial prescription of contraceptive pills: Secondary | ICD-10-CM

## 2020-04-22 MED ORDER — CRYSELLE-28 0.3-30 MG-MCG PO TABS: 1.0000 | ORAL_TABLET | Freq: Every day | ORAL | 5 refills | Status: DC

## 2020-05-12 ENCOUNTER — Other Ambulatory Visit: Payer: Self-pay | Admitting: Internal Medicine

## 2020-05-12 DIAGNOSIS — R000 Tachycardia, unspecified: Secondary | ICD-10-CM

## 2020-05-12 DIAGNOSIS — G43009 Migraine without aura, not intractable, without status migrainosus: Secondary | ICD-10-CM

## 2020-05-19 ENCOUNTER — Encounter: Payer: Self-pay | Admitting: Internal Medicine

## 2020-05-20 ENCOUNTER — Ambulatory Visit: Payer: PRIVATE HEALTH INSURANCE | Admitting: Internal Medicine

## 2020-05-20 ENCOUNTER — Ambulatory Visit (INDEPENDENT_AMBULATORY_CARE_PROVIDER_SITE_OTHER): Payer: PRIVATE HEALTH INSURANCE

## 2020-05-20 ENCOUNTER — Encounter: Payer: Self-pay | Admitting: Internal Medicine

## 2020-05-20 ENCOUNTER — Other Ambulatory Visit: Payer: Self-pay

## 2020-05-20 VITALS — BP 126/84 | HR 83 | Temp 98.3°F | Ht 66.0 in | Wt 248.0 lb

## 2020-05-20 DIAGNOSIS — J069 Acute upper respiratory infection, unspecified: Secondary | ICD-10-CM

## 2020-05-20 DIAGNOSIS — R059 Cough, unspecified: Secondary | ICD-10-CM

## 2020-05-20 MED ORDER — HYDROCOD POLST-CPM POLST ER 10-8 MG/5ML PO SUER: 5.0000 mL | Freq: Two times a day (BID) | ORAL | 0 refills | Status: DC | PRN

## 2020-05-20 NOTE — Patient Instructions (Signed)

## 2020-05-20 NOTE — Progress Notes (Signed)
Subjective:  Patient ID: Erin Harris, female    DOB: [birth year]  Age: 30 y.o. MRN: [id number]  CC: Cough   HPI Erin Harris presents for f/up - see below  For 10 days or so I've been feeling pretty crummy. It started just as a scratchy throat and my voice sounding funny like I had laryngitis. I figured it was just my allergies causing a post nasal drip so I didn't do anything for it. About 8 days ago I started to also feel some sinus pain/pressure. I took a Covid test which was negative. Then about 5 days ago I started getting a cough. Mainly only at night when laying flat. The other symptoms mostly went away but the cough has gotten progressively worse. It's now affecting my ability to get any quality sleep as I'm constantly hacking so hard that I almost feel a choking sensation. Causes a HA. I've also noticed a slight burning sensation in my chest. The only other time I've felt that was when I had pneumonia. No chest pain or SOB. Dry cough. No fever.   I've been taking Robatussin for the past 5 days but it doesn't seem to help at night. I also have cough drops that I basically live off during the day to prevent the coughing. 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 I've still been going to work as I feel bad leaving my doctor all alone to see the patients, but it's been almost 2 weeks and I'm feeling run down over this.   Any ideas or suggestions? OTC or prescription meds you think would help? I can come in for a visit too if you'd rather see me in person. This week I'm available Monday after 2:30, Tuesday after 3, or Thursday after 2.  Thanks   Outpatient Medications Prior to Visit  Medication Sig Dispense Refill  . Atogepant (QULIPTA) 60 MG TABS Take 1 tablet by mouth daily. 90 tablet 1  . buPROPion (WELLBUTRIN XL) 300 MG 24 hr tablet Take 300 mg by mouth daily.    Marland Kitchen dicyclomine (BENTYL) 10 MG capsule  Take 1 capsule (10 mg total) by mouth 3 (three) times daily before meals. 270 capsule 0  . norgestrel-ethinyl estradiol (CRYSELLE-28) 0.3-30 MG-MCG tablet Take 1 tablet by mouth daily. 28 tablet 5  . potassium chloride SA (KLOR-CON) 20 MEQ tablet Take 1 tablet (20 mEq total) by mouth 2 (two) times daily. 180 tablet 1  . propranolol ER (INDERAL LA) 60 MG 24 hr capsule TAKE 1 CAPSULE BY MOUTH EVERY DAY 90 capsule 1  . traZODone (DESYREL) 50 MG tablet Take 50 mg by mouth at bedtime.    Marland Kitchen Ubrogepant (UBRELVY) 100 MG TABS Take 1 tablet by mouth daily as needed. 30 tablet 1  . venlafaxine (EFFEXOR) 75 MG tablet Take 75 mg by mouth daily.    Marland Kitchen venlafaxine XR (EFFEXOR-XR) 150 MG 24 hr capsule Take 150 mg by mouth daily.     No facility-administered medications prior to visit.    ROS Review of Systems  Constitutional: Negative.  Negative for chills, diaphoresis, fatigue and fever.  HENT: Positive for sore throat. Negative for trouble swallowing.   Eyes: Negative.   Respiratory: Positive for cough. Negative for chest tightness, shortness of breath and wheezing.   Cardiovascular: Negative for chest pain, palpitations and leg swelling.  Gastrointestinal: Negative for abdominal pain, constipation, diarrhea, nausea and vomiting.  Endocrine: Negative.   Genitourinary: Negative.  Negative for difficulty urinating and dysuria.  Musculoskeletal: Negative.  Negative for arthralgias.  Skin: Negative.  Negative for color change, pallor and rash.  Neurological: Negative.  Negative for dizziness, weakness and light-headedness.  Hematological: Negative for adenopathy. Does not bruise/bleed easily.  Psychiatric/Behavioral: Negative.     Objective:  BP 126/84   Pulse 83   Temp 98.3 F (36.8 C) (Oral)   Ht 5' 6"  (1.676 m)   Wt 248 lb (112.5 kg)   SpO2 98%   BMI 40.03 kg/m   BP Readings from Last 3 Encounters:  05/20/20 126/84  02/19/20 126/82    Wt Readings from Last 3 Encounters:  05/20/20 248 lb  (112.5 kg)  02/19/20 252 lb (114.3 kg)    Physical Exam Vitals reviewed.  Constitutional:      Appearance: She is not ill-appearing.  HENT:     Mouth/Throat:     Lips: Pink.     Mouth: Mucous membranes are moist.     Pharynx: No posterior oropharyngeal erythema.     Tonsils: No tonsillar exudate.  Eyes:     General: No scleral icterus.    Conjunctiva/sclera: Conjunctivae normal.  Cardiovascular:     Rate and Rhythm: Normal rate and regular rhythm.     Heart sounds: No murmur heard.   Pulmonary:     Effort: Pulmonary effort is normal.     Breath sounds: No stridor. No wheezing, rhonchi or rales.  Abdominal:     General: Abdomen is flat. Bowel sounds are normal. There is no distension.     Palpations: Abdomen is soft. There is no hepatomegaly, splenomegaly or mass.     Tenderness: There is no abdominal tenderness.  Musculoskeletal:        General: Normal range of motion.     Cervical back: Neck supple.     Right lower leg: No edema.     Left lower leg: No edema.  Lymphadenopathy:     Cervical: No cervical adenopathy.  Skin:    General: Skin is warm and dry.  Neurological:     General: No focal deficit present.     Mental Status: She is alert.  Psychiatric:        Mood and Affect: Mood normal.        Behavior: Behavior normal.     Lab Results  Component Value Date   WBC 9.7 02/19/2020   HGB 12.9 02/19/2020   HCT 38.1 02/19/2020   PLT 333.0 02/19/2020   GLUCOSE 98 02/19/2020   CHOL 125 02/19/2020   TRIG 164.0 (H) 02/19/2020   HDL 38.20 (L) 02/19/2020   LDLCALC 54 02/19/2020   ALT 59 (H) 02/19/2020   AST 32 02/19/2020   NA 135 02/19/2020   K 3.4 (L) 02/19/2020   CL 103 02/19/2020   CREATININE 0.91 02/19/2020   BUN 13 02/19/2020   CO2 25 02/19/2020   TSH 2.82 02/19/2020    MR Brain Wo Contrast  Result Date: 03/10/2020 CLINICAL DATA:  Chronic migraine headaches with worsening. EXAM: MRI HEAD WITHOUT CONTRAST TECHNIQUE: Multiplanar, multiecho pulse  sequences of the brain and surrounding structures were obtained without intravenous contrast. COMPARISON:  None. FINDINGS: Brain: The brain has a normal appearance without evidence of malformation, atrophy, old or acute small or large vessel infarction, mass lesion, hemorrhage, hydrocephalus or extra-axial collection. Vascular: Major vessels at the base of the brain show flow. Venous sinuses appear patent. Skull and upper cervical spine: Normal. Sinuses/Orbits:  Clear/normal. Other: None significant. IMPRESSION: Normal examination. No abnormality seen to explain headache. Electronically Signed   By: Nelson Chimes M.D.   On: 03/10/2020 02:04   DG Chest 2 View  Result Date: 05/20/2020 CLINICAL DATA:  Cough and chest burning for 10 days. EXAM: CHEST - 2 VIEW COMPARISON:  None. FINDINGS: The cardiomediastinal silhouette is within normal limits. No airspace consolidation, edema, pleural effusion, pneumothorax is identified. No acute osseous abnormality is seen. IMPRESSION: No active cardiopulmonary disease. Electronically Signed   By: Logan Bores M.D.   On: 05/20/2020 13:28    Assessment & Plan:   Erin Harris was seen today for cough.  Diagnoses and all orders for this visit:  Cough- Her chest x-ray is negative for mass or infiltrate.  Will treat for viral URI. -     DG Chest 2 View; Future -     Discontinue: chlorpheniramine-HYDROcodone (TUSSIONEX PENNKINETIC ER) 10-8 MG/5ML SUER; Take 5 mLs by mouth every 12 (twelve) hours as needed for cough. -     chlorpheniramine-HYDROcodone (TUSSIONEX PENNKINETIC ER) 10-8 MG/5ML SUER; Take 5 mLs by mouth every 12 (twelve) hours as needed for cough.  URI with cough and congestion -     Discontinue: chlorpheniramine-HYDROcodone (TUSSIONEX PENNKINETIC ER) 10-8 MG/5ML SUER; Take 5 mLs by mouth every 12 (twelve) hours as needed for cough. -     chlorpheniramine-HYDROcodone (TUSSIONEX PENNKINETIC ER) 10-8 MG/5ML SUER; Take 5 mLs by mouth every 12 (twelve) hours as needed  for cough.   I am having Conor Hoeg maintain her buPROPion, traZODone, Qulipta, Ubrelvy, potassium chloride SA, dicyclomine, Cryselle-28, propranolol ER, venlafaxine XR, venlafaxine, and chlorpheniramine-HYDROcodone.  Meds ordered this encounter  Medications  . DISCONTD: chlorpheniramine-HYDROcodone (TUSSIONEX PENNKINETIC ER) 10-8 MG/5ML SUER    Sig: Take 5 mLs by mouth every 12 (twelve) hours as needed for cough.    Dispense:  140 mL    Refill:  0  . chlorpheniramine-HYDROcodone (TUSSIONEX PENNKINETIC ER) 10-8 MG/5ML SUER    Sig: Take 5 mLs by mouth every 12 (twelve) hours as needed for cough.    Dispense:  140 mL    Refill:  0     Follow-up: Return if symptoms worsen or fail to improve.  Scarlette Calico, MD

## 2020-05-23 ENCOUNTER — Ambulatory Visit: Payer: PRIVATE HEALTH INSURANCE | Admitting: Internal Medicine

## 2020-05-28 ENCOUNTER — Other Ambulatory Visit: Payer: Self-pay | Admitting: Internal Medicine

## 2020-05-28 DIAGNOSIS — J4541 Moderate persistent asthma with (acute) exacerbation: Secondary | ICD-10-CM

## 2020-05-28 MED ORDER — METHYLPREDNISOLONE 4 MG PO TBPK
ORAL_TABLET | ORAL | 0 refills | Status: AC
Start: 2020-05-28 — End: 2020-06-03

## 2020-06-18 ENCOUNTER — Telehealth: Payer: Self-pay

## 2020-06-18 NOTE — Telephone Encounter (Signed)
Key: [id number]  Erin Harris Erin Harris rep) came by requesting a PA be done so if it is denied again the pt came be placed on a "bridge" so she can get the medication for free.

## 2020-07-03 NOTE — Telephone Encounter (Signed)
   [employer] calling for status of prior auth   Phone [phone]

## 2020-07-16 ENCOUNTER — Encounter: Payer: Self-pay | Admitting: Internal Medicine

## 2020-07-18 ENCOUNTER — Telehealth (INDEPENDENT_AMBULATORY_CARE_PROVIDER_SITE_OTHER): Payer: No Typology Code available for payment source | Admitting: Internal Medicine

## 2020-07-18 ENCOUNTER — Encounter: Payer: Self-pay | Admitting: Internal Medicine

## 2020-07-18 DIAGNOSIS — J069 Acute upper respiratory infection, unspecified: Secondary | ICD-10-CM

## 2020-07-18 DIAGNOSIS — U071 COVID-19: Secondary | ICD-10-CM | POA: Insufficient documentation

## 2020-07-18 DIAGNOSIS — R059 Cough, unspecified: Secondary | ICD-10-CM

## 2020-07-18 MED ORDER — AZITHROMYCIN 250 MG PO TABS: ORAL_TABLET | ORAL | 0 refills | Status: DC

## 2020-07-18 MED ORDER — HYDROCOD POLST-CPM POLST ER 10-8 MG/5ML PO SUER: 5.0000 mL | Freq: Two times a day (BID) | ORAL | 0 refills | Status: DC | PRN

## 2020-07-18 NOTE — Assessment & Plan Note (Signed)
Worse. Tussionex prn Rx DayQuil prn Zack if worse

## 2020-07-18 NOTE — Progress Notes (Signed)
Virtual Visit via Video Note  I connected with Erin Harris on 07/18/20 at  4:00 PM EDT by a video enabled telemedicine application and verified that I am speaking with the correct person using two identifiers.   I discussed the limitations of evaluation and management by telemedicine and the availability of in person appointments. The patient expressed understanding and agreed to proceed.  I was located at our [facility] office. The patient was at home. There was no one else present in the visit.   History of Present Illness:   Erin Harris has been sick with severe fatigue, runny nose, cough, arthralgias, fever x 4-5 d.  She is feeling better now, however, she started to have nasal discharge with green/yellow sputum and a productive cough that has been keeping her awake at night.  No shortness of breath or chest pain.  The patient is not pregnant.  She went on a cruise before her illness.   Observations/Objective: The patient appears to be in no acute distress, looks tired and sounds congested  Assessment and Plan:  See my Assessment and Plan. Follow Up Instructions:    I discussed the assessment and treatment plan with the patient. The patient was provided an opportunity to ask questions and all were answered. The patient agreed with the plan and demonstrated an understanding of the instructions.   The patient was advised to call back or seek an in-person evaluation if the symptoms worsen or if the condition fails to improve as anticipated.  I provided face-to-face time during this encounter. We were at different locations.   Walker Kehr, MD

## 2020-07-18 NOTE — Assessment & Plan Note (Signed)
Overall better. Rest more. Zinc, Vit C, hydration

## 2020-08-29 ENCOUNTER — Other Ambulatory Visit: Payer: Self-pay | Admitting: Internal Medicine

## 2020-08-29 ENCOUNTER — Encounter: Payer: Self-pay | Admitting: Internal Medicine

## 2020-08-29 DIAGNOSIS — F3341 Major depressive disorder, recurrent, in partial remission: Secondary | ICD-10-CM

## 2020-08-29 MED ORDER — VENLAFAXINE HCL 75 MG PO TABS: 75.0000 mg | ORAL_TABLET | Freq: Every day | ORAL | 1 refills | Status: DC

## 2020-08-29 MED ORDER — BUPROPION HCL ER (XL) 300 MG PO TB24: 300.0000 mg | ORAL_TABLET | Freq: Every day | ORAL | 1 refills | Status: DC

## 2020-08-29 MED ORDER — TRAZODONE HCL 50 MG PO TABS: 50.0000 mg | ORAL_TABLET | Freq: Every day | ORAL | 1 refills | Status: DC

## 2020-08-29 MED ORDER — VENLAFAXINE HCL ER 150 MG PO CP24: 150.0000 mg | ORAL_CAPSULE | Freq: Every day | ORAL | 1 refills | Status: AC

## 2020-08-30 ENCOUNTER — Other Ambulatory Visit: Payer: Self-pay | Admitting: Internal Medicine

## 2020-09-28 ENCOUNTER — Other Ambulatory Visit: Payer: Self-pay | Admitting: Internal Medicine

## 2020-09-28 DIAGNOSIS — Z30011 Encounter for initial prescription of contraceptive pills: Secondary | ICD-10-CM

## 2020-10-03 ENCOUNTER — Encounter: Payer: Self-pay | Admitting: Internal Medicine

## 2020-10-06 ENCOUNTER — Other Ambulatory Visit: Payer: Self-pay | Admitting: Internal Medicine

## 2020-10-06 DIAGNOSIS — G43009 Migraine without aura, not intractable, without status migrainosus: Secondary | ICD-10-CM

## 2020-10-06 MED ORDER — QULIPTA 60 MG PO TABS: 1.0000 | ORAL_TABLET | Freq: Every day | ORAL | 1 refills | Status: DC

## 2020-10-15 ENCOUNTER — Other Ambulatory Visit: Payer: Self-pay | Admitting: Internal Medicine

## 2020-10-15 ENCOUNTER — Telehealth: Payer: Self-pay

## 2020-10-15 DIAGNOSIS — G43009 Migraine without aura, not intractable, without status migrainosus: Secondary | ICD-10-CM

## 2020-10-15 MED ORDER — EMGALITY 120 MG/ML ~~LOC~~ SOAJ: 240.0000 mg | Freq: Once | SUBCUTANEOUS | 0 refills | Status: AC

## 2020-10-15 MED ORDER — UBRELVY 100 MG PO TABS: 1.0000 | ORAL_TABLET | Freq: Every day | ORAL | 1 refills | Status: DC | PRN

## 2020-10-15 NOTE — Telephone Encounter (Signed)
Key: [id number]

## 2020-10-16 ENCOUNTER — Telehealth: Payer: Self-pay

## 2020-10-16 NOTE — Telephone Encounter (Signed)
PA was started for Ubrelvy.  PA had to go through pts insurance PA page. The PA EOC ID: [id number]  PA website to look up for PA status is [email or web address]

## 2020-10-22 ENCOUNTER — Other Ambulatory Visit: Payer: Self-pay

## 2020-10-22 ENCOUNTER — Encounter (HOSPITAL_BASED_OUTPATIENT_CLINIC_OR_DEPARTMENT_OTHER): Payer: Self-pay | Admitting: *Deleted

## 2020-10-22 ENCOUNTER — Telehealth (INDEPENDENT_AMBULATORY_CARE_PROVIDER_SITE_OTHER): Payer: No Typology Code available for payment source | Admitting: Family Medicine

## 2020-10-22 ENCOUNTER — Encounter: Payer: Self-pay | Admitting: Neurology

## 2020-10-22 ENCOUNTER — Encounter: Payer: Self-pay | Admitting: Internal Medicine

## 2020-10-22 ENCOUNTER — Other Ambulatory Visit: Payer: Self-pay | Admitting: Internal Medicine

## 2020-10-22 ENCOUNTER — Emergency Department (HOSPITAL_BASED_OUTPATIENT_CLINIC_OR_DEPARTMENT_OTHER)
Admission: EM | Admit: 2020-10-22 | Discharge: 2020-10-23 | Disposition: A | Discharge status: Home / Self Care | Payer: No Typology Code available for payment source | Attending: Emergency Medicine | Admitting: Emergency Medicine

## 2020-10-22 DIAGNOSIS — R252 Cramp and spasm: Secondary | ICD-10-CM | POA: Diagnosis not present

## 2020-10-22 DIAGNOSIS — J029 Acute pharyngitis, unspecified: Secondary | ICD-10-CM | POA: Diagnosis present

## 2020-10-22 DIAGNOSIS — M436 Torticollis: Secondary | ICD-10-CM

## 2020-10-22 DIAGNOSIS — J4541 Moderate persistent asthma with (acute) exacerbation: Secondary | ICD-10-CM | POA: Insufficient documentation

## 2020-10-22 DIAGNOSIS — G43009 Migraine without aura, not intractable, without status migrainosus: Secondary | ICD-10-CM

## 2020-10-22 DIAGNOSIS — Z20822 Contact with and (suspected) exposure to covid-19: Secondary | ICD-10-CM | POA: Insufficient documentation

## 2020-10-22 LAB — GROUP A STREP BY PCR: Group A Strep by PCR: NOT DETECTED

## 2020-10-22 MED ORDER — LIDOCAINE VISCOUS HCL 2 % MT SOLN: 15.0000 mL | OROMUCOSAL | 0 refills | Status: DC | PRN

## 2020-10-22 MED ORDER — DEXAMETHASONE SODIUM PHOSPHATE 10 MG/ML IJ SOLN
8.0000 mg | Freq: Once | INTRAMUSCULAR | Status: AC
  Administered 2020-10-22: 8 mg via INTRAMUSCULAR
  Filled 2020-10-22: qty 1

## 2020-10-22 MED ORDER — ACETAMINOPHEN 325 MG PO TABS
650.0000 mg | ORAL_TABLET | Freq: Once | ORAL | Status: AC
  Administered 2020-10-22: 650 mg via ORAL
  Filled 2020-10-22: qty 2

## 2020-10-22 MED ORDER — LIDOCAINE VISCOUS HCL 2 % MT SOLN
15.0000 mL | Freq: Once | OROMUCOSAL | Status: AC
  Administered 2020-10-22: 15 mL via OROMUCOSAL
  Filled 2020-10-22: qty 15

## 2020-10-22 MED ORDER — PENICILLIN G BENZATHINE 1200000 UNIT/2ML IM SUSY
1.2000 10*6.[IU] | PREFILLED_SYRINGE | Freq: Once | INTRAMUSCULAR | Status: AC
  Administered 2020-10-22: 1.2 10*6.[IU] via INTRAMUSCULAR
  Filled 2020-10-22: qty 2

## 2020-10-22 MED ORDER — IBUPROFEN 800 MG PO TABS
800.0000 mg | ORAL_TABLET | Freq: Once | ORAL | Status: AC
  Administered 2020-10-22: 800 mg via ORAL
  Filled 2020-10-22: qty 1

## 2020-10-22 MED ORDER — PREDNISONE 10 MG PO TABS: 40.0000 mg | ORAL_TABLET | Freq: Every day | ORAL | 0 refills | Status: AC

## 2020-10-22 NOTE — Progress Notes (Signed)
Virtual Visit via Video Note  I connected with Erin Harris  on 10/22/20 at  6:00 PM EDT by a video enabled telemedicine application and verified that I am speaking with the correct person using two identifiers.  Location patient: home, [place] Location provider:work or home office Persons participating in the virtual visit: patient, provider  I discussed the limitations of evaluation and management by telemedicine and the availability of in person appointments. The patient expressed understanding and agreed to proceed.   HPI:  Acute telemedicine visit for sore throat: -Onset:2 days ago -2 home covid tests were negative -Symptoms include: sore throat - severe at times, ears and jaw hurt, has a stiff painful neck in the back, feels chilled, is not sure if she has a fever, cough, "jerky spasm-ing movements" in legs, body aches -Denies: vomiting, vision distortion, weakness or numbness in the legs, CP, SOB, diarrhea, known sick contacts - except dog has a resp infection -Has tried:dayquil, nyquil   ROS: See pertinent positives and negatives per HPI.  Past Medical History:  Diagnosis Date   Allergy    Depression    Frequent headaches    IBS (irritable bowel syndrome)    Migraine    PTSD (post-traumatic stress disorder)    UTI (urinary tract infection)     Past Surgical History:  Procedure Laterality Date   ARTHROSCOPIC REPAIR ACL     CHOLECYSTECTOMY     WISDOM TOOTH EXTRACTION       Current Outpatient Medications:    buPROPion (WELLBUTRIN XL) 300 MG 24 hr tablet, Take 1 tablet (300 mg total) by mouth daily., Disp: 90 tablet, Rfl: 1   CRYSELLE-28 0.3-30 MG-MCG tablet, TAKE 1 TABLET BY MOUTH EVERY DAY, Disp: 84 tablet, Rfl: 1   dicyclomine (BENTYL) 10 MG capsule, Take 1 capsule (10 mg total) by mouth 3 (three) times daily before meals., Disp: 270 capsule, Rfl: 0   potassium chloride SA (KLOR-CON) 20 MEQ tablet, Take 1 tablet (20 mEq total) by mouth 2 (two) times daily., Disp: 180 tablet,  Rfl: 1   propranolol ER (INDERAL LA) 60 MG 24 hr capsule, TAKE 1 CAPSULE BY MOUTH EVERY DAY, Disp: 90 capsule, Rfl: 1   traZODone (DESYREL) 50 MG tablet, Take 1 tablet (50 mg total) by mouth at bedtime., Disp: 90 tablet, Rfl: 1   Ubrogepant (UBRELVY) 100 MG TABS, Take 1 tablet by mouth daily as needed., Disp: 30 tablet, Rfl: 1   venlafaxine (EFFEXOR) 75 MG tablet, Take 1 tablet (75 mg total) by mouth daily., Disp: 90 tablet, Rfl: 1   venlafaxine XR (EFFEXOR-XR) 150 MG 24 hr capsule, Take 1 capsule (150 mg total) by mouth daily., Disp: 90 capsule, Rfl: 1  EXAM:  VITALS per patient if applicable:  GENERAL: alert, oriented, lying in dark room in bed  HEENT: atraumatic, conjunttiva clear, no obvious abnormalities on inspection of external nose and ears  NECK: reports pain in the post neck with movements of the head and neck  LUNGS: on inspection no signs of respiratory distress, breathing rate appears normal, no obvious gross SOB, gasping or wheezing  CV: no obvious cyanosis   PSYCH/NEURO: pleasant and cooperative, no obvious depression or anxiety, speech and thought processing grossly intact  ASSESSMENT AND PLAN:  Discussed the following assessment and plan:  Spasm  Neck stiffness  Sore throat  -we discussed possible serious and likely etiologies, options for evaluation and workup, limitations of telemedicine visit vs in person visit, treatment, treatment risks and precautions. Given some of the  unusual symptoms she is having advised prompt inperson evaluation at a higher level of care. Since PCP office in unavailable advise of [facility] and [facility] options for this evening. She prefers to go by private vehicle and has someone who can drive her. She agrees to go after this visit today.   I discussed the assessment and treatment plan with the patient. The patient was provided an opportunity to ask questions and all were answered. The patient agreed with the plan and demonstrated an  understanding of the instructions.     Lucretia Kern, DO

## 2020-10-22 NOTE — ED Provider Notes (Signed)
[facility] EMERGENCY DEPARTMENT Provider Note   CSN: [id number] Arrival date & time: 10/22/20  1838     History Chief Complaint  Patient presents with   Sore Throat    Erin Harris is a 30 y.o. female.  HPI Patient is a 30 year old female with a medical history as noted below.  She presents to the emergency department due to sore throat.  Patient reports associated neck pain, neck stiffness, body aches, chills.  Symptoms started about 3 days ago and have been progressively worsening.  States her pain radiates into the bilateral ears and jaw.  Denies any dysphagia or drooling.  No shortness of breath.  Vaccinated for COVID-19 x2.  Reports a previous COVID-19 infection about 3 months ago.    Past Medical History:  Diagnosis Date   Allergy    Depression    Frequent headaches    IBS (irritable bowel syndrome)    Migraine    PTSD (post-traumatic stress disorder)    UTI (urinary tract infection)     Patient Active Problem List   Diagnosis Date Noted   Moderate persistent asthma with acute exacerbation 05/28/2020   NASH (nonalcoholic steatohepatitis) 02/21/2020   New daily persistent headache 02/21/2020   Hypokalemia, inadequate intake 02/20/2020   Elevated LFTs 02/20/2020   Tachycardia 02/19/2020   Migraine without aura and without status migrainosus, not intractable 02/19/2020   Encounter for general adult medical examination with abnormal findings 02/19/2020   Elevated prolactin level 02/19/2020    Past Surgical History:  Procedure Laterality Date   ARTHROSCOPIC REPAIR ACL     CHOLECYSTECTOMY     WISDOM TOOTH EXTRACTION       OB History   No obstetric history on file.     Family History  Problem Relation Age of Onset   Migraines Mother    Diabetes Father    Hyperlipidemia Father    Hypertension Father    Hearing loss Maternal Grandfather    Heart disease Maternal Grandfather    Early death Paternal Grandmother    Heart disease Paternal  Grandmother    Hearing loss Paternal Grandfather    Cancer Paternal Grandfather    Cancer Sister    Birth defects Sister    Heart disease Sister    Alcohol abuse Brother     Social History   Tobacco Use   Smoking status: Never   Smokeless tobacco: Never  Vaping Use   Vaping Use: Never used  Substance Use Topics   Alcohol use: Not Currently   Drug use: Never    Home Medications Prior to Admission medications   Medication Sig Start Date End Date Taking? Authorizing Provider  lidocaine (XYLOCAINE) 2 % solution Use as directed 15 mLs in the mouth or throat as needed for mouth pain. 10/22/20  Yes Rayna Sexton, PA-C  predniSONE (DELTASONE) 10 MG tablet Take 4 tablets (40 mg total) by mouth daily with breakfast for 5 days. 10/22/20 10/27/20 Yes Rayna Sexton, PA-C  buPROPion (WELLBUTRIN XL) 300 MG 24 hr tablet Take 1 tablet (300 mg total) by mouth daily. 08/29/20   Janith Lima, MD  CRYSELLE-28 0.3-30 MG-MCG tablet TAKE 1 TABLET BY MOUTH EVERY DAY 09/28/20   Janith Lima, MD  dicyclomine (BENTYL) 10 MG capsule Take 1 capsule (10 mg total) by mouth 3 (three) times daily before meals. 03/12/20   Janith Lima, MD  potassium chloride SA (KLOR-CON) 20 MEQ tablet Take 1 tablet (20 mEq total) by mouth 2 (two) times daily.  02/20/20   Janith Lima, MD  propranolol ER (INDERAL LA) 60 MG 24 hr capsule TAKE 1 CAPSULE BY MOUTH EVERY DAY 05/12/20   Janith Lima, MD  traZODone (DESYREL) 50 MG tablet Take 1 tablet (50 mg total) by mouth at bedtime. 08/29/20   Janith Lima, MD  Ubrogepant (UBRELVY) 100 MG TABS Take 1 tablet by mouth daily as needed. 10/15/20   Janith Lima, MD  venlafaxine (EFFEXOR) 75 MG tablet Take 1 tablet (75 mg total) by mouth daily. 08/29/20   Janith Lima, MD  venlafaxine XR (EFFEXOR-XR) 150 MG 24 hr capsule Take 1 capsule (150 mg total) by mouth daily. 08/29/20   Janith Lima, MD    Allergies    Imitrex [sumatriptan], Lactose, and Nickel  Review of Systems    Review of Systems  HENT:  Positive for ear pain and sore throat. Negative for drooling, ear discharge and trouble swallowing.   Respiratory:  Negative for cough and shortness of breath.    Physical Exam Updated Vital Signs BP (!) 92/54   Pulse 80   Temp 97.7 F (36.5 C) (Oral)   Resp 16   Ht 5' 6"  (1.676 m)   SpO2 97%   BMI 40.03 kg/m   Physical Exam Vitals and nursing note reviewed.  Constitutional:      General: She is not in acute distress.    Appearance: Normal appearance. She is well-developed. She is not ill-appearing, toxic-appearing or diaphoretic.  HENT:     Head: Normocephalic and atraumatic.     Right Ear: Tympanic membrane, ear canal and external ear normal. No drainage, swelling or tenderness. No middle ear effusion. Tympanic membrane is not erythematous.     Left Ear: Tympanic membrane, ear canal and external ear normal. No drainage, swelling or tenderness.  No middle ear effusion. Tympanic membrane is not erythematous.     Nose: Nose normal.     Mouth/Throat:     Mouth: Mucous membranes are moist.     Pharynx: Oropharynx is clear. Uvula midline. No oropharyngeal exudate or posterior oropharyngeal erythema.     Tonsils: Tonsillar exudate present. No tonsillar abscesses. 1+ on the right. 1+ on the left.     Comments: Uvula midline.  Erythema noted diffusely along the bilateral tonsils as well as the posterior oropharynx.  Mild exudates noted on the bilateral tonsils.  Readily handling secretions.  No hot potato voice.  No stridor. Eyes:     Extraocular Movements: Extraocular movements intact.     Right eye: Normal extraocular motion.     Left eye: Normal extraocular motion.  Neck:     Comments: Full range of motion of the cervical spine.   Cardiovascular:     Rate and Rhythm: Normal rate and regular rhythm.     Pulses: Normal pulses.     Heart sounds: Normal heart sounds. No murmur heard.   No friction rub. No gallop.  Pulmonary:     Effort: Pulmonary effort  is normal. No respiratory distress.     Breath sounds: Normal breath sounds. No stridor. No wheezing, rhonchi or rales.  Abdominal:     General: Abdomen is flat.  Musculoskeletal:        General: Normal range of motion.     Cervical back: Normal range of motion and neck supple. No tenderness.  Skin:    General: Skin is warm and dry.  Neurological:     General: No focal deficit present.  Mental Status: She is alert and oriented to person, place, and time.  Psychiatric:        Mood and Affect: Mood normal.        Behavior: Behavior normal.   ED Results / Procedures / Treatments   Labs (all labs ordered are listed, but only abnormal results are displayed) Labs Reviewed  GROUP A STREP BY PCR  RESP PANEL BY RT-PCR (FLU A&B, COVID) [id number]    EKG None  Radiology No results found.  Procedures Procedures   Medications Ordered in ED Medications  ibuprofen (ADVIL) tablet 800 mg (800 mg Oral Given 10/22/20 2212)  acetaminophen (TYLENOL) tablet 650 mg (650 mg Oral Given 10/22/20 2212)  lidocaine (XYLOCAINE) 2 % viscous mouth solution 15 mL (15 mLs Mouth/Throat Given 10/22/20 2214)  dexamethasone (DECADRON) injection 8 mg (8 mg Intramuscular Given 10/22/20 2213)  penicillin g benzathine (BICILLIN LA) 1200000 UNIT/2ML injection 1.2 Million Units (1.2 Million Units Intramuscular Given 10/22/20 2343)    ED Course  I have reviewed the triage vital signs and the nursing notes.  Pertinent labs & imaging results that were available during my care of the patient were reviewed by me and considered in my medical decision making (see chart for details).    MDM Rules/Calculators/A&P                          Pt is a 30 y.o. female who presents to the emergency department due to sore throat and neck pain.  Labs: Strep test is negative. Respiratory panel is pending.  I, Rayna Sexton, PA-C, personally reviewed and evaluated these images and lab results as part of my medical  decision-making.  Physical exam consistent with pharyngitis.  Strep test is negative.  Patient given ibuprofen, Tylenol, Decadron, as well as viscous lidocaine and notes moderate improvement in her symptoms.  Still reports moderate neck pain.  She does have full range of motion of the cervical spine.  No nuchal rigidity.  No headache, fever, altered mental status.  Doubt meningitis.  Patient afebrile and not tachycardic.  Vital signs have been reassuring.  Feel that the patient is stable for discharge at this time and she is agreeable.  Will discharge on a course of prednisone.  We discussed return precautions.  Her questions were answered and she was amicable at the time of discharge.  Note: Portions of this report may have been transcribed using voice recognition software. Every effort was made to ensure accuracy; however, inadvertent computerized transcription errors may be present.   Final Clinical Impression(s) / ED Diagnoses Final diagnoses:  Pharyngitis, unspecified etiology   Rx / DC Orders ED Discharge Orders          Ordered    lidocaine (XYLOCAINE) 2 % solution  As needed        10/22/20 2331    predniSONE (DELTASONE) 10 MG tablet  Daily with breakfast        10/22/20 2331             Rayna Sexton, PA-C 10/23/20 0005    Davonna Belling, MD 10/23/20 0005

## 2020-10-22 NOTE — Patient Instructions (Signed)
Seek prompt inperson medical care today as we discussed.     I hope you are feeling better soon!  It was nice to meet you today. I help [facility] out with telemedicine visits on Tuesdays and Thursdays and am available for visits on those days. If you have any concerns or questions following this visit please schedule a follow up visit with your Primary Care doctor or seek care at a local urgent care clinic to avoid delays in care.

## 2020-10-22 NOTE — ED Triage Notes (Signed)
C/o sore throat x 3 days , neck pain and stiffness, virtual visit this pm with PMD and sent here for further eval

## 2020-10-22 NOTE — Discharge Instructions (Addendum)
I am prescribing you a strong steroid medication called prednisone.  Please only take this as prescribed.  Please take it early in the morning, as this medication can be stimulating and make it difficult to sleep at night.  I am also prescribing you viscous lidocaine.  You can use this as needed for management of your sore throat.  I would also recommend that you use Tylenol as well as ibuprofen for management of your symptoms during the day.  Please check the results of your respiratory panel tomorrow morning.  This will contain results for COVID-19, flu A, as well as flu B.  If you develop any new or worsening symptoms please come back to the emergency department immediately for reevaluation.  It was a pleasure to meet you.

## 2020-10-23 LAB — RESP PANEL BY RT-PCR (FLU A&B, COVID) ARPGX2
Influenza A by PCR: NEGATIVE
Influenza B by PCR: NEGATIVE
SARS Coronavirus 2 by RT PCR: NEGATIVE

## 2020-11-09 ENCOUNTER — Other Ambulatory Visit: Payer: Self-pay | Admitting: Internal Medicine

## 2020-11-09 DIAGNOSIS — G43009 Migraine without aura, not intractable, without status migrainosus: Secondary | ICD-10-CM

## 2020-11-09 DIAGNOSIS — R000 Tachycardia, unspecified: Secondary | ICD-10-CM

## 2020-12-31 ENCOUNTER — Other Ambulatory Visit: Payer: Self-pay

## 2020-12-31 ENCOUNTER — Ambulatory Visit (HOSPITAL_COMMUNITY)
Admission: EM | Admit: 2020-12-31 | Discharge: 2020-12-31 | Disposition: A | Discharge status: Home / Self Care | Payer: No Typology Code available for payment source | Attending: Emergency Medicine | Admitting: Emergency Medicine

## 2020-12-31 ENCOUNTER — Encounter (HOSPITAL_COMMUNITY): Payer: Self-pay | Admitting: Emergency Medicine

## 2020-12-31 DIAGNOSIS — B9689 Other specified bacterial agents as the cause of diseases classified elsewhere: Secondary | ICD-10-CM | POA: Diagnosis not present

## 2020-12-31 DIAGNOSIS — J329 Chronic sinusitis, unspecified: Secondary | ICD-10-CM | POA: Diagnosis not present

## 2020-12-31 MED ORDER — PSEUDOEPH-BROMPHEN-DM 30-2-10 MG/5ML PO SYRP: 10.0000 mL | ORAL_SOLUTION | Freq: Four times a day (QID) | ORAL | 0 refills | Status: DC | PRN

## 2020-12-31 MED ORDER — BENZONATATE 100 MG PO CAPS: 100.0000 mg | ORAL_CAPSULE | Freq: Three times a day (TID) | ORAL | 0 refills | Status: DC

## 2020-12-31 MED ORDER — DOXYCYCLINE HYCLATE 100 MG PO CAPS: 100.0000 mg | ORAL_CAPSULE | Freq: Two times a day (BID) | ORAL | 0 refills | Status: AC

## 2020-12-31 MED ORDER — ALBUTEROL SULFATE HFA 108 (90 BASE) MCG/ACT IN AERS: 2.0000 | INHALATION_SPRAY | RESPIRATORY_TRACT | 0 refills | Status: DC | PRN

## 2020-12-31 NOTE — ED Triage Notes (Signed)
Pt is present today with cough. Pt sx started 10 days ago.

## 2020-12-31 NOTE — ED Provider Notes (Signed)
[facility]  ____________________________________________  Time seen: Approximately 6:06 PM  I have reviewed the triage vital signs and the nursing notes.   HISTORY  Chief Complaint Nasal Congestion and Cough    HPI [facility] is a 30 y.o. female who presents to the urgent care complaining of nasal congestion, sinus pressure, cough.  Patient states that she had COVID earlier this year, has had repetitive infections since then.  She is having sinus pressure, congestion, cough.  Cough is worse when laying down.  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  She has been using DayQuil, NyQuil.  She was prescribed Augmentin yesterday by a coworker but has not started same.       Past Medical History:  Diagnosis Date   Allergy    Depression    Frequent headaches    IBS (irritable bowel syndrome)    Migraine    PTSD (post-traumatic stress disorder)    UTI (urinary tract infection)     Patient Active Problem List   Diagnosis Date Noted   Moderate persistent asthma with acute exacerbation 05/28/2020   NASH (nonalcoholic steatohepatitis) 02/21/2020   New daily persistent headache 02/21/2020   Hypokalemia, inadequate intake 02/20/2020   Elevated LFTs 02/20/2020   Tachycardia 02/19/2020   Migraine without aura and without status migrainosus, not intractable 02/19/2020   Encounter for general adult medical examination with abnormal findings 02/19/2020   Elevated prolactin level 02/19/2020    Past Surgical History:  Procedure Laterality Date   ARTHROSCOPIC REPAIR ACL     CHOLECYSTECTOMY     WISDOM TOOTH EXTRACTION      Prior to Admission medications   Medication Sig Start Date End Date Taking? Authorizing Provider  albuterol (VENTOLIN HFA) 108 (90 Base) MCG/ACT inhaler Inhale 2 puffs into the lungs every 4 (four) hours as needed for wheezing or  shortness of breath. 12/31/20  Yes Sanyla Summey, Charline Bills, PA-C  benzonatate (TESSALON) 100 MG capsule Take 1 capsule (100 mg total) by mouth every 8 (eight) hours. 12/31/20  Yes Khari Mally, Charline Bills, PA-C  brompheniramine-pseudoephedrine-DM 30-2-10 MG/5ML syrup Take 10 mLs by mouth 4 (four) times daily as needed. 12/31/20  Yes Bryceton Hantz, Charline Bills, PA-C  doxycycline (VIBRAMYCIN) 100 MG capsule Take 1 capsule (100 mg total) by mouth 2 (two) times daily for 7 days. 12/31/20 01/07/21 Yes Taeshaun Rames, Charline Bills, PA-C  buPROPion (WELLBUTRIN XL) 300 MG 24 hr tablet Take 1 tablet (300 mg total) by mouth daily. 08/29/20   Janith Lima, MD  CRYSELLE-28 0.3-30 MG-MCG tablet TAKE 1 TABLET BY MOUTH EVERY DAY 09/28/20   Janith Lima, MD  dicyclomine (BENTYL) 10 MG capsule Take 1 capsule (10 mg total) by mouth 3 (three) times daily before meals. 03/12/20   Janith Lima, MD  lidocaine (XYLOCAINE) 2 % solution Use as directed 15 mLs in the mouth or throat as needed for mouth pain. 10/22/20   Rayna Sexton, PA-C  potassium chloride SA (KLOR-CON) 20 MEQ tablet Take 1 tablet (20 mEq total) by mouth 2 (two) times daily. 02/20/20   Janith Lima, MD  propranolol ER (INDERAL LA) 60 MG 24 hr capsule TAKE 1 CAPSULE BY MOUTH EVERY DAY 11/09/20   Janith Lima, MD  traZODone (DESYREL) 50 MG tablet Take 1 tablet (50 mg total) by mouth at bedtime. 08/29/20   Janith Lima, MD  Ubrogepant (UBRELVY) 100 MG TABS Take 1 tablet by mouth daily as needed. 10/15/20   Janith Lima, MD  venlafaxine (EFFEXOR) 75 MG tablet Take 1 tablet (75 mg total) by mouth daily. 08/29/20   Janith Lima, MD  venlafaxine XR (EFFEXOR-XR) 150 MG 24 hr capsule Take 1 capsule (150 mg total) by mouth daily. 08/29/20   Janith Lima, MD    Allergies Imitrex [sumatriptan], Lactose, and Nickel  Family History  Problem Relation Age of Onset   Migraines Mother    Diabetes Father    Hyperlipidemia Father    Hypertension Father    Hearing loss  Maternal Grandfather    Heart disease Maternal Grandfather    Early death Paternal Grandmother    Heart disease Paternal Grandmother    Hearing loss Paternal Grandfather    Cancer Paternal Grandfather    Cancer Sister    Birth defects Sister    Heart disease Sister    Alcohol abuse Brother     Social History Social History   Tobacco Use   Smoking status: Never   Smokeless tobacco: Never  Vaping Use   Vaping Use: Never used  Substance Use Topics   Alcohol use: Not Currently   Drug use: Never     Review of Systems  Constitutional: No fever/chills Eyes: No visual changes. No discharge ENT: Sinus pain, sinus congestion, postnasal drip Cardiovascular: no chest pain. Respiratory: Positive cough. No SOB. Gastrointestinal: No abdominal pain.  No nausea, no vomiting.  No diarrhea.  No constipation. Musculoskeletal: Negative for musculoskeletal pain. Skin: Negative for rash, abrasions, lacerations, ecchymosis. Neurological: Negative for headaches, focal weakness or numbness.  10 System ROS otherwise negative.  ____________________________________________   PHYSICAL EXAM:  VITAL SIGNS: ED Triage Vitals  Enc Vitals Group     BP 12/31/20 1738 107/74     Pulse Rate 12/31/20 1738 83     Resp 12/31/20 1738 18     Temp 12/31/20 1738 98.1 F (36.7 C)     Temp Source 12/31/20 1738 Oral     SpO2 12/31/20 1738 95 %     Weight --      Height --      Head Circumference --      Peak Flow --      Pain Score 12/31/20 1737 0     Pain Loc --      Pain Edu? --      Excl. in [place]? --      Constitutional: Alert and oriented. Well appearing and in no acute distress. Eyes: Conjunctivae are normal. PERRL. EOMI. Head: Atraumatic. ENT:      Ears:       Nose: Moderate congestion/rhinnorhea.  Maxillary sinuses tender on the right side      Mouth/Throat: Mucous membranes are moist.  Neck: No stridor.  Neck is supple full range of motion. Hematological/Lymphatic/Immunilogical: No  cervical lymphadenopathy. Cardiovascular: Normal rate, regular rhythm. Normal S1 and S2.  Good peripheral circulation. Respiratory: Normal respiratory effort without tachypnea or retractions. Lungs CTAB. Good air entry to the bases with no decreased or absent breath sounds. Musculoskeletal: Full range of motion to all extremities. No gross deformities appreciated. Neurologic:  Normal speech and language. No gross focal neurologic deficits are appreciated.  Skin:  Skin is warm, dry and intact. No rash noted. Psychiatric: Mood and affect are normal. Speech and behavior are normal. Patient exhibits appropriate insight and judgement.   ____________________________________________   LABS (all labs ordered are listed, but only  abnormal results are displayed)  Labs Reviewed - No data to display ____________________________________________  EKG   ____________________________________________  RADIOLOGY   No results found.  ____________________________________________    PROCEDURES  Procedure(s) performed:    Procedures    Medications - No data to display   ____________________________________________   INITIAL IMPRESSION / ASSESSMENT AND PLAN / ED COURSE  Pertinent labs & imaging results that were available during my care of the patient were reviewed by me and considered in my medical decision making (see chart for details).  Review of the [place] CSRS was performed in accordance of the [place] prior to dispensing any controlled drugs.           Patient's diagnosis is consistent with sinusitis with postnasal drip.  Patient presented to the urgent care complaining of congestion, sinus pressure, postnasal drip and cough.  Patient has been prescribed Augmentin but has not started same.  I recommended Flonase, Zyrtec, I will add doxycycline to the Augmentin to cover any pneumonia as well as bacterial sinusitis.  Patient was symptom control medicines and Bromfed cough syrup,  Tessalon Perles.  Patient states that when she starts coughing she starts gagging feel short of breath and I will add albuterol to ensure there is no bronchospasm.  Follow-up with primary care. Patient is given ED precautions to return to the ED for any worsening or new symptoms.     ____________________________________________  FINAL CLINICAL IMPRESSION(S) / DIAGNOSES  Final diagnoses:  Bacterial sinusitis      NEW MEDICATIONS STARTED DURING THIS VISIT:  ED Discharge Orders          Ordered    doxycycline (VIBRAMYCIN) 100 MG capsule  2 times daily        12/31/20 1809    brompheniramine-pseudoephedrine-DM 30-2-10 MG/5ML syrup  4 times daily PRN        12/31/20 1809    benzonatate (TESSALON) 100 MG capsule  Every 8 hours        12/31/20 1809    albuterol (VENTOLIN HFA) 108 (90 Base) MCG/ACT inhaler  Every 4 hours PRN        12/31/20 1809                This chart was dictated using voice recognition software/Dragon. Despite best efforts to proofread, errors can occur which can change the meaning. Any change was purely unintentional.    Darletta Moll, PA-C 12/31/20 1830

## 2021-01-23 ENCOUNTER — Other Ambulatory Visit: Payer: Self-pay | Admitting: Internal Medicine

## 2021-01-23 DIAGNOSIS — K582 Mixed irritable bowel syndrome: Secondary | ICD-10-CM

## 2021-01-23 MED ORDER — DICYCLOMINE HCL 10 MG PO CAPS: 10.0000 mg | ORAL_CAPSULE | Freq: Three times a day (TID) | ORAL | 0 refills | Status: DC

## 2021-02-07 ENCOUNTER — Other Ambulatory Visit: Payer: Self-pay

## 2021-02-07 ENCOUNTER — Ambulatory Visit: Payer: No Typology Code available for payment source | Admitting: Neurology

## 2021-02-07 ENCOUNTER — Encounter: Payer: Self-pay | Admitting: Neurology

## 2021-02-07 VITALS — BP 98/70 | HR 95 | Ht 66.0 in

## 2021-02-07 DIAGNOSIS — G43009 Migraine without aura, not intractable, without status migrainosus: Secondary | ICD-10-CM

## 2021-02-07 MED ORDER — RIZATRIPTAN BENZOATE 10 MG PO TBDP: ORAL_TABLET | ORAL | 5 refills | Status: DC

## 2021-02-07 MED ORDER — AIMOVIG 140 MG/ML ~~LOC~~ SOAJ: 140.0000 mg | SUBCUTANEOUS | 5 refills | Status: DC

## 2021-02-07 MED ORDER — ONDANSETRON 4 MG PO TBDP: 4.0000 mg | ORAL_TABLET | Freq: Three times a day (TID) | ORAL | 5 refills | Status: DC | PRN

## 2021-02-07 NOTE — Progress Notes (Signed)
NEUROLOGY CONSULTATION NOTE  [facility] MRN: [id number] DOB: Jan 14, 1991  Referring provider: Scarlette Calico, MD Primary care provider: Scarlette Calico, MD  Reason for consult:  migraine  Assessment/Plan:   Migraine without aura, without status migrainosus, intractable  Migraine prevention:  Start Aimovig 140mg  every 28 days.  Continue propranolol ER 60mg  daily.  Also on venlafaxine XR 225mg  daily for anxiety which also is helpful for migraine. Migraine rescue:  rizatriptan 10mg  and ondansetron 4mg  Limit use of pain relievers to no more than 2 days out of week to prevent risk of rebound or medication-overuse headache. Keep headache diary Follow up 6 months.    Subjective:  [facility] is a 31 year old female with PTSD, depression, IBS, tachycardia and migraine who presents for migraines.  History supplemented by primary care notes.  Onset:  headaches in high school.  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  Location:  base of skull/back of neck radiating to jaw and ears bilaterally Quality:  throbbing, pressure Intensity:  6-7/10.   Aura:  absent Prodrome:  absent Associated symptoms:  Nausea, photophobia, phonophobia, sometimes vomiting.  She denies associated visual disturbance, unilateral numbness or weakness. Duration:  3-4 days (within a few hours with Roselyn Meier) Frequency:  once a week to once every other week (1 every 2 months on Qulipta) Frequency of abortive medication: ibuprofen 1 to 2 days a week Triggers:  stress, poor sleep, poor diet, [place] food, chocolate, hormonal Relieving factors:  resting in quiet dark room Activity:  aggravates In between, keeps a constant low-grade dull frontal/periorbital-maxillary headache  MRI of brain without contrast on 03/09/2020 personally reviewed was normal.  Current NSAIDS/analgesics:  ibuprofen 800mg  to 1000mg  Current triptans:  none Current ergotamine:  none Current anti-emetic:  none Current muscle relaxants:   none Current Antihypertensive medications:  propranolol ER 60mg  daily Current Antidepressant medications:  venlafaxine XR 225mg  daily, bupropion XL 300mg  daily, trazodone 50mg  QHS (insomnia) Current Anticonvulsant medications:  none Current anti-CGRP:  Ubrelvy 100mg  Current Vitamins/Herbal/Supplements:  none Current Antihistamines/Decongestants:  none Other therapy:  none Hormone/birth control:  none   Past NSAIDS/analgesics:  Excedrin, acetaminophen Past abortive triptans:  sumatriptan (caused palpitations) Past abortive ergotamine:  none Past muscle relaxants:  none Past anti-emetic:  promethazine Past antihypertensive medications:  metoprolol Past antidepressant medications:  citalopram Past anticonvulsant medications:  none Past anti-CGRP:  Qulipta 60mg  (effective but no longer covered), Roselyn Meier 100mg  (effective but no longer covered) Past vitamins/Herbal/Supplements:  none Past antihistamines/decongestants:  none Other past therapies:  none  Caffeine:  None Diet:  Hydrates with water.  No soda.   Exercise:  not routine Depression:  yes; Anxiety:  yes Other pain:  no Sleep hygiene:  okay.  Trazodone helps.   Family history of migraines:  mother      PAST MEDICAL HISTORY: Past Medical History:  Diagnosis Date   Allergy    Depression    Frequent headaches    IBS (irritable bowel syndrome)    Migraine    PTSD (post-traumatic stress disorder)    UTI (urinary tract infection)     PAST SURGICAL HISTORY: Past Surgical History:  Procedure Laterality Date   ARTHROSCOPIC REPAIR ACL     CHOLECYSTECTOMY     WISDOM TOOTH EXTRACTION      MEDICATIONS: Current Outpatient Medications on File Prior to Visit  Medication Sig Dispense Refill   albuterol (VENTOLIN HFA) 108 (90 Base) MCG/ACT inhaler Inhale 2 puffs into the lungs every 4 (four) hours as needed for wheezing  or shortness of breath. 1 each 0   benzonatate (TESSALON) 100 MG capsule Take 1 capsule (100 mg total) by  mouth every 8 (eight) hours. 21 capsule 0   brompheniramine-pseudoephedrine-DM 30-2-10 MG/5ML syrup Take 10 mLs by mouth 4 (four) times daily as needed. 200 mL 0   buPROPion (WELLBUTRIN XL) 300 MG 24 hr tablet Take 1 tablet (300 mg total) by mouth daily. 90 tablet 1   CRYSELLE-28 0.3-30 MG-MCG tablet TAKE 1 TABLET BY MOUTH EVERY DAY 84 tablet 1   dicyclomine (BENTYL) 10 MG capsule Take 1 capsule (10 mg total) by mouth 3 (three) times daily before meals. 270 capsule 0   lidocaine (XYLOCAINE) 2 % solution Use as directed 15 mLs in the mouth or throat as needed for mouth pain. 20 mL 0   potassium chloride SA (KLOR-CON) 20 MEQ tablet Take 1 tablet (20 mEq total) by mouth 2 (two) times daily. 180 tablet 1   propranolol ER (INDERAL LA) 60 MG 24 hr capsule TAKE 1 CAPSULE BY MOUTH EVERY DAY 90 capsule 1   traZODone (DESYREL) 50 MG tablet Take 1 tablet (50 mg total) by mouth at bedtime. 90 tablet 1   Ubrogepant (UBRELVY) 100 MG TABS Take 1 tablet by mouth daily as needed. 30 tablet 1   venlafaxine (EFFEXOR) 75 MG tablet Take 1 tablet (75 mg total) by mouth daily. 90 tablet 1   venlafaxine XR (EFFEXOR-XR) 150 MG 24 hr capsule Take 1 capsule (150 mg total) by mouth daily. 90 capsule 1   No current facility-administered medications on file prior to visit.    ALLERGIES: Allergies  Allergen Reactions   Imitrex [Sumatriptan] Palpitations   Lactose Itching   Nickel Rash    FAMILY HISTORY: Family History  Problem Relation Age of Onset   Migraines Mother    Diabetes Father    Hyperlipidemia Father    Hypertension Father    Hearing loss Maternal Grandfather    Heart disease Maternal Grandfather    Early death Paternal Grandmother    Heart disease Paternal Grandmother    Hearing loss Paternal Grandfather    Cancer Paternal Grandfather    Cancer Sister    Birth defects Sister    Heart disease Sister    Alcohol abuse Brother     Objective:  Blood pressure 98/70, pulse 95, height 5\' 6"  (1.676  m), SpO2 97 %. General: No acute distress.  Patient appears well-groomed.   Head:  Normocephalic/atraumatic Eyes:  fundi examined but not visualized Neck: supple, no paraspinal tenderness, full range of motion Back: No paraspinal tenderness Heart: regular rate and rhythm Lungs: Clear to auscultation bilaterally. Vascular: No carotid bruits. Neurological Exam: Mental status: alert and oriented to person, place, and time, recent and remote memory intact, fund of knowledge intact, attention and concentration intact, speech fluent and not dysarthric, language intact. Cranial nerves: CN I: not tested CN II: pupils equal, round and reactive to light, visual fields intact CN III, IV, VI:  full range of motion, no nystagmus, no ptosis CN V: facial sensation intact. CN VII: upper and lower face symmetric CN VIII: hearing intact CN IX, X: gag intact, uvula midline CN XI: sternocleidomastoid and trapezius muscles intact CN XII: tongue midline Bulk & Tone: normal, no fasciculations. Motor:  muscle strength 5/5 throughout Sensation:  Pinprick, temperature and vibratory sensation intact. Deep Tendon Reflexes:  2+ throughout,  toes downgoing.   Finger to nose testing:  Without dysmetria.   Heel to shin:  Without dysmetria.  Gait:  Normal station and stride.  Romberg negative.    Thank you for allowing me to take part in the care of this patient.  Metta Clines, DO  CC: Scarlette Calico, MD

## 2021-02-07 NOTE — Addendum Note (Signed)
Addended byEverlena Cooper, Naethan Bracewell R on: 02/07/2021 03:36 PM   Modules accepted: Orders

## 2021-02-07 NOTE — Patient Instructions (Signed)
°  Start Aimovig 140mg every 28 days.   Take rizatriptan 10mg at earliest onset of headache.  May repeat dose once in 2 hours if needed.  Maximum 2 tablets in 24 hours. Take ondansetron for nausea Limit use of pain relievers to no more than 2 days out of the week.  These medications include acetaminophen, NSAIDs (ibuprofen/Advil/Motrin, naproxen/Aleve, triptans (Imitrex/sumatriptan), Excedrin, and narcotics.  This will help reduce risk of rebound headaches. Be aware of common food triggers:  - Caffeine:  coffee, black tea, cola, Mt. Dew  - Chocolate  - Dairy:  aged cheeses (brie, blue, cheddar, gouda, Parmasan, provolone, romano, Swiss, etc), chocolate milk, buttermilk, sour cream, limit eggs and yogurt  - Nuts, peanut butter  - Alcohol  - Cereals/grains:  FRESH breads (fresh bagels, sourdough, doughnuts), yeast productions  - Processed/canned/aged/cured meats (pre-packaged deli meats, hotdogs)  - MSG/glutamate:  soy sauce, flavor enhancer, pickled/preserved/marinated foods  - Sweeteners:  aspartame (Equal, Nutrasweet).  Sugar and Splenda are okay  - Vegetables:  legumes (lima beans, lentils, snow peas, fava beans, pinto peans, peas, garbanzo beans), sauerkraut, onions, olives, pickles  - Fruit:  avocados, bananas, citrus fruit (orange, lemon, grapefruit), mango  - Other:  Frozen meals, macaroni and cheese Routine exercise Stay adequately hydrated (aim for 64 oz water daily) Keep headache diary Maintain proper stress management Maintain proper sleep hygiene Do not skip meals Consider supplements:  magnesium citrate 400mg daily, riboflavin 400mg daily, coenzyme Q10 100mg three times daily.  

## 2021-02-10 ENCOUNTER — Telehealth: Payer: Self-pay

## 2021-02-10 NOTE — Telephone Encounter (Signed)
New message   [facility] does not manage PA for this patient. Please contact the number on the back of the members card for further assistance  [facility] is unable to retrieve the clinical questions that must be submitted to initiate the PA request. Please see more information at the bottom of the page for next steps.  Sakiya m. Dobratz (KeyGeorg Ruddle) Rx #: [id number] Aimovig 140MG/ML auto-injectors   Form [employer] Electronic PA Form [id number] NCPDP) Created 3 days ago Sent to Plan 1 minute ago Plan Response 1 minute ago Submit Clinical Questions Determination Message from Plan [facility] does not manage PA for this patient. Please contact the number on the back of the members card for further assistance

## 2021-02-27 ENCOUNTER — Encounter: Payer: No Typology Code available for payment source | Admitting: Obstetrics and Gynecology

## 2021-03-01 ENCOUNTER — Other Ambulatory Visit: Payer: Self-pay | Admitting: Internal Medicine

## 2021-03-01 DIAGNOSIS — F3341 Major depressive disorder, recurrent, in partial remission: Secondary | ICD-10-CM

## 2021-03-12 LAB — HM PAP SMEAR

## 2021-04-01 ENCOUNTER — Encounter: Payer: Self-pay | Admitting: Neurology

## 2021-04-02 ENCOUNTER — Telehealth: Payer: Self-pay

## 2021-04-02 NOTE — Telephone Encounter (Signed)
F/u   Per patient insurance voicemail to submit prior authorization to [employer].   Thank you for creating a prior authorization request using PromptPA.  The prior authorization department will contact you if additional information is needed. Once a decision is made you will be notified of the decision. You can check the status of your request using the Check Status link. Please note down (Prior Auth EOC ID) to check status.  Prior Authorization request details:  Prior Auth (EOC) ID: [id number] Drug/Service Name: QULIPTA 60 [place] TABLET  Patient: [facility] Date Requested: 04/02/2021 11:19:36 AM    MemberID: [id number] DOB: 1990-10-20

## 2021-04-02 NOTE — Telephone Encounter (Signed)
New message  Thank you for creating a prior authorization request using PromptPA.  The prior authorization department will contact you if additional information is needed. Once a decision is made you will be notified of the decision. You can check the status of your request using the Check Status link. Please note down (Prior Auth EOC ID) to check status.  Prior Authorization request details:  Prior Auth (EOC) ID: [id number] Drug/Service Name: QULIPTA 60 [place] TABLET  Patient: [facility] Date Requested: 04/02/2021 11:19:36 AM    MemberID: [id number] DOB: [birth year]

## 2021-04-02 NOTE — Telephone Encounter (Signed)
F/u  Call patient insurance and transfer to the prior authorization department - [phone] was told to call back 8am central time

## 2021-04-02 NOTE — Telephone Encounter (Signed)
F/U - Resubmit  [facility] (Key: [id number]) Aimovig 140MG/ML auto-injectors   Form [employer] Electronic PA Form [phone] NCPDP) Created 4 minutes ago Sent to Plan less than a minute ago Plan Response less than a minute ago Submit Clinical Questions Determination Message from Plan [facility] does not manage PA for this patient. Please contact the number on the back of the members card for further assistance

## 2021-04-03 NOTE — Telephone Encounter (Signed)
F/u  Received fax from [employer]  Medication Qulitpa 60 mg  Effective date  3.8.2023 to  3.7.2024

## 2021-04-03 NOTE — Telephone Encounter (Signed)
F/u   Received fax from [employer]   Medication Qulitpa 60 mg   Effective date  3.8.2023 to  3.7.2024

## 2021-04-10 ENCOUNTER — Other Ambulatory Visit: Payer: Self-pay

## 2021-04-10 MED ORDER — QULIPTA 60 MG PO TABS: 60.0000 mg | ORAL_TABLET | Freq: Every day | ORAL | 5 refills | Status: DC

## 2021-04-10 NOTE — Progress Notes (Signed)
Qulipta approved through insurance.  ?

## 2021-05-02 ENCOUNTER — Other Ambulatory Visit: Payer: Self-pay | Admitting: Internal Medicine

## 2021-05-02 DIAGNOSIS — K582 Mixed irritable bowel syndrome: Secondary | ICD-10-CM

## 2021-06-01 ENCOUNTER — Other Ambulatory Visit: Payer: Self-pay | Admitting: Internal Medicine

## 2021-06-01 DIAGNOSIS — R000 Tachycardia, unspecified: Secondary | ICD-10-CM

## 2021-06-01 DIAGNOSIS — G43009 Migraine without aura, not intractable, without status migrainosus: Secondary | ICD-10-CM

## 2021-08-07 NOTE — Progress Notes (Signed)
NEUROLOGY FOLLOW UP OFFICE NOTE  Erin Harris [id number]  Assessment/Plan:     Migraine without aura, without status migrainosus, intractable Cervicogenic headache/cervicalgia   As she has failed pharmacologic management for the cervicalgia/cervicogenic headache, will check MRI of cervical spine without contrast. Migraine prevention:  Qulipta 11m daily  Continue propranolol ER 622mdaily.  Also on venlafaxine XR 22529maily for anxiety which also is helpful for migraine. Migraine rescue:  Will have her try Nurtec.  If ineffective, will retry Ubrelvy 100m43m it was effective and she would have failed rizatriptan, sumatriptan and Nurtec Limit use of pain relievers to no more than 2 days out of week to prevent risk of rebound or medication-overuse headache. Keep headache diary Follow up 4 months.       Subjective:  Erin Harris 31 y55r old female with PTSD, depression, IBS, tachycardia and migraine who follows up for migraine.  UPDATE: Started Aimovig but felt headaches became worse.  She wanted to switch back to QuliSterlingSevere headaches occurred only twice since last visit - Still daily dull frontal headache Still has pain at base of skull with neck pain.  Moving neck hears cracking Frequency of abortive medication: rarely Current NSAIDS/analgesics:  none Current triptans:  rizatriptan 10mg39mrent ergotamine:  none Current anti-emetic:  Zofran 4mg C38ment muscle relaxants:  none Current Antihypertensive medications:  propranolol ER 60mg d13m Current Antidepressant medications:  venlafaxine XR 225mg da57m bupropion XL 300mg dai67mtrazodone 50mg QHS 37momnia) Current Anticonvulsant medications:  none Current anti-CGRP:  Qulipta 60mg daily3mrent Vitamins/Herbal/Supplements:  none Current Antihistamines/Decongestants:  none Other therapy:  none Hormone/birth control:  none  Caffeine:  None Diet:  Hydrates with water.  No soda.   Exercise:  not  routine Depression:  yes; Anxiety:  yes Other pain:  no Sleep hygiene:  okay.  Trazodone helps.    HISTORY:  Onset:  headaches in high school.  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  Location:  base of skull/back of neck radiating to jaw and ears bilaterally Quality:  throbbing, pressure Intensity:  6-7/10.   Aura:  absent Prodrome:  absent Associated symptoms:  Nausea, photophobia, phonophobia, sometimes vomiting.  She denies associated visual disturbance, unilateral numbness or weakness. Duration:  3-4 days (within a few hours with Ubrelvy) FrRoselyn Meier:  once a week to once every other week (1 every 2 months on Qulipta) Frequency of abortive medication: ibuprofen 1 to 2 days a week Triggers:  stress, poor sleep, poor diet, Chinese fooMong[place]olate, hormonal Relieving factors:  resting in quiet dark room Activity:  aggravates In between, keeps a constant low-grade dull frontal/periorbital-maxillary headache   MRI of brain without contrast on 03/09/2020 was normal.   Past NSAIDS/analgesics:  Excedrin, acetaminophen, ibuprofen Past abortive triptans:  sumatriptan (caused palpitations) Past abortive ergotamine:  none Past muscle relaxants:  none Past anti-emetic:  promethazine Past antihypertensive medications:  metoprolol Past antidepressant medications:  citalopram Past anticonvulsant medications:  none Past anti-CGRP:  Aimovig 140mg, Ubrel65m00mg (effect49mbut no longer covered) Past vitamins/Herbal/Supplements:  none Past antihistamines/decongestants:  none Other past therapies:  trigger point injections    Family history of migraines:  mother  PAST MEDICAL HISTORY: Past Medical History:  Diagnosis Date   Allergy    Depression    Frequent headaches    IBS (irritable bowel syndrome)    Migraine    PTSD (post-traumatic stress disorder)    UTI (urinary tract infection)     MEDICATIONS: Current Outpatient  Medications on File Prior to Visit  Medication Sig  Dispense Refill   Atogepant (QULIPTA) 60 MG TABS Take 60 mg by mouth daily at 2 PM. 30 tablet 5   buPROPion (WELLBUTRIN XL) 300 MG 24 hr tablet Take 1 tablet (300 mg total) by mouth daily. 90 tablet 1   CRYSELLE-28 0.3-30 MG-MCG tablet TAKE 1 TABLET BY MOUTH EVERY DAY 84 tablet 1   dicyclomine (BENTYL) 10 MG capsule TAKE 1 CAPSULE (10 MG TOTAL) BY MOUTH 3 (THREE) TIMES DAILY BEFORE MEALS. 270 capsule 0   Erenumab-aooe (AIMOVIG) 140 MG/ML SOAJ Inject 140 mg into the skin every 28 (twenty-eight) days. 1.12 mL 5   ondansetron (ZOFRAN-ODT) 4 MG disintegrating tablet Take 1 tablet (4 mg total) by mouth every 8 (eight) hours as needed for nausea or vomiting. 20 tablet 5   propranolol ER (INDERAL LA) 60 MG 24 hr capsule TAKE 1 CAPSULE BY MOUTH EVERY DAY 90 capsule 1   rizatriptan (MAXALT-MLT) 10 MG disintegrating tablet Take 1 tablet earliest onset of migraine.  May repeat in 2 hours if needed.  Maximum 2 tablets in 24 hours. 10 tablet 5   traZODone (DESYREL) 50 MG tablet Take 1 tablet (50 mg total) by mouth at bedtime. 90 tablet 1   venlafaxine (EFFEXOR) 75 MG tablet TAKE 1 TABLET BY MOUTH EVERY DAY 90 tablet 1   venlafaxine XR (EFFEXOR-XR) 150 MG 24 hr capsule Take 1 capsule (150 mg total) by mouth daily. 90 capsule 1   No current facility-administered medications on file prior to visit.    ALLERGIES: Allergies  Allergen Reactions   Imitrex [Sumatriptan] Palpitations   Lactose Other (See Comments)    GI Issues   Nickel Rash    FAMILY HISTORY: Family History  Problem Relation Age of Onset   Migraines Mother    Diabetes Father    Hyperlipidemia Father    Hypertension Father    Hearing loss Maternal Grandfather    Heart disease Maternal Grandfather    Early death Paternal Grandmother    Heart disease Paternal Grandmother    Hearing loss Paternal Grandfather    Cancer Paternal Grandfather    Cancer Sister    Birth defects Sister    Heart disease Sister    Alcohol abuse Brother        Objective:  Blood pressure 115/80, pulse 75, height 5' 6"  (1.676 m), weight 254 lb (115.2 kg), SpO2 97 %. General: No acute distress.  Patient appears well-groomed.      Metta Clines, DO  CC: Erin Calico, MD

## 2021-08-08 ENCOUNTER — Ambulatory Visit: Payer: No Typology Code available for payment source | Admitting: Neurology

## 2021-08-08 ENCOUNTER — Encounter: Payer: Self-pay | Admitting: Neurology

## 2021-08-08 VITALS — BP 115/80 | HR 75 | Ht 66.0 in | Wt 254.0 lb

## 2021-08-08 DIAGNOSIS — M542 Cervicalgia: Secondary | ICD-10-CM | POA: Diagnosis not present

## 2021-08-08 DIAGNOSIS — G43009 Migraine without aura, not intractable, without status migrainosus: Secondary | ICD-10-CM

## 2021-08-08 DIAGNOSIS — G4486 Cervicogenic headache: Secondary | ICD-10-CM | POA: Diagnosis not present

## 2021-08-08 NOTE — Patient Instructions (Signed)
Will get MRI of cervical spine In meantime, continue Qulipta. For abortive therapy, try Nurtec - 1 tablet daily as needed.  Let me know. If you prefer Roselyn Meier, will send another prescription Further recommendations pending results.  Otherwise, follow up 4 months.

## 2021-08-11 ENCOUNTER — Encounter: Payer: Self-pay | Admitting: Neurology

## 2021-09-10 ENCOUNTER — Encounter: Payer: Self-pay | Admitting: Neurology

## 2021-09-11 ENCOUNTER — Other Ambulatory Visit (HOSPITAL_COMMUNITY): Payer: Self-pay

## 2021-09-11 ENCOUNTER — Ambulatory Visit
Admission: RE | Admit: 2021-09-11 | Discharge: 2021-09-11 | Disposition: A | Discharge status: Home / Self Care | Payer: No Typology Code available for payment source | Source: Ambulatory Visit | Attending: Neurology | Admitting: Neurology

## 2021-09-11 ENCOUNTER — Other Ambulatory Visit: Payer: Self-pay | Admitting: Neurology

## 2021-09-11 ENCOUNTER — Telehealth (HOSPITAL_COMMUNITY): Payer: Self-pay | Admitting: Pharmacy Technician

## 2021-09-11 DIAGNOSIS — G4486 Cervicogenic headache: Secondary | ICD-10-CM

## 2021-09-11 DIAGNOSIS — M542 Cervicalgia: Secondary | ICD-10-CM

## 2021-09-11 MED ORDER — NURTEC 75 MG PO TBDP: 75.0000 mg | ORAL_TABLET | Freq: Every day | ORAL | 5 refills | Status: DC | PRN

## 2021-09-11 NOTE — Telephone Encounter (Signed)
Patient Advocate Encounter  Prior Authorization for Nurtec 38m tablets has been approved.     Effective dates: 09/11/2021 through 09/11/2022      JLyndel Safe CMineolaPatient Advocate Specialist CMillertonPatient Advocate Team Direct Number: [phone] Fax: [phone]

## 2021-09-11 NOTE — Telephone Encounter (Signed)
Patient Advocate Encounter   Received notification that prior authorization for Nurtec 54m tablets is required.   PA submitted on 09/11/2021 Prior Auth (EOC) ID: [id number] Status is pending       JLyndel Safe CMud LakePatient Advocate Specialist CMcGrawPatient Advocate Team Direct Number: [phone] Fax: [phone]

## 2021-09-12 ENCOUNTER — Telehealth: Payer: Self-pay | Admitting: Neurology

## 2021-09-12 NOTE — Telephone Encounter (Signed)
Erin Harris is returning a call to someone. She doesn't know if its for results or what

## 2021-11-03 ENCOUNTER — Other Ambulatory Visit: Payer: Self-pay | Admitting: Neurology

## 2021-11-03 ENCOUNTER — Encounter: Payer: Self-pay | Admitting: Neurology

## 2021-12-08 ENCOUNTER — Other Ambulatory Visit: Payer: Self-pay | Admitting: Neurology

## 2021-12-16 NOTE — Progress Notes (Signed)
NEUROLOGY FOLLOW UP OFFICE NOTE  Erin Harris [id number]  Assessment/Plan:   Migraine without aura, without status migrainosus, intractable, improved Cervicogenic headache, improved Cervicalgia, likely myofascial   To address neck pain, start baclofen 82m TID PRN and refer to physical therapy Migraine prevention:  Qulipta 679mdaily  Continue propranolol ER 6021maily.  Also on venlafaxine XR 225m55mily for anxiety which also is helpful for migraine. Migraine rescue:  Nurtec Limit use of pain relievers to no more than 2 days out of week to prevent risk of rebound or medication-overuse headache. Keep headache diary Follow up 4 to 5 months.       Subjective:  Erin Harris 31 y60r old female with PTSD, depression, IBS, tachycardia and migraine who follows up for migraine and neck pain.   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   - Severe headaches occurred only twice since last visit.  One time, it was different, involving severe sharp pain in the left eye.  Another time, she woke up central blurred vision in the left eye lasting 12 hours.  With Nurtec, severe pain lasts an hour.    - 15 dull frontal headache days a month (down from daily)  Frequency of abortive medication: rarely Current NSAIDS/analgesics:  none Current triptans:  rizatriptan 10mg43m longer using) Current ergotamine:  none Current anti-emetic:  Zofran 4mg C50ment muscle relaxants:  none Current Antihypertensive medications:  propranolol ER 60mg d5m Current Antidepressant medications:  venlafaxine XR 225mg da69m bupropion XL 300mg dai71mtrazodone 50mg QHS 45momnia) Current Anticonvulsant medications:  none Current anti-CGRP:  Qulipta 60mg daily37mrtec  (rescue) Current Vitamins/Herbal/Supplements:  none Current Antihistamines/Decongestants:  none Other therapy:  none Hormone/birth control:  none   Caffeine:  None Diet:  Hydrates with water.  No soda.   Exercise:  not routine Depression:  yes; Anxiety:  yes Other pain:  no Sleep hygiene:  okay.  Trazodone helps.     HISTORY:  Onset:  headaches in high school.  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  Location:  base of skull/back of neck radiating to jaw and ears bilaterally Quality:  throbbing, pressure Intensity:  6-7/10.   Aura:  absent Prodrome:  absent Associated symptoms:  Nausea, photophobia, phonophobia, sometimes vomiting.  She denies associated visual disturbance, unilateral numbness or weakness. Duration:  3-4 days (within a few hours with Ubrelvy) FrRoselyn Meier:  once a week to once every other week (1 every 2 months on Qulipta) Frequency of abortive medication: ibuprofen 1 to 2 days a week Triggers:  stress, poor sleep, poor diet, Chinese foo[place]ong[place]olate, hormonal Relieving factors:  resting in quiet dark room Activity:  aggravates In between, keeps a constant low-grade dull frontal/periorbital-maxillary headache   MRI of brain without contrast on 03/09/2020 was normal.   Past NSAIDS/analgesics:  Excedrin, acetaminophen, ibuprofen Past abortive triptans:  sumatriptan (caused palpitations) Past abortive ergotamine:  none Past muscle relaxants:  Flexeril, baclofen Past anti-emetic:  promethazine Past antihypertensive medications:  metoprolol Past antidepressant medications:  citalopram Past anticonvulsant medications:  none Past anti-CGRP:  Aimovig 140mg, Ubrel33m00mg (effect45mbut no longer covered) Past vitamins/Herbal/Supplements:  none Past antihistamines/decongestants:  none Other past therapies:  trigger point injections     Family history of migraines:  mother  PAST MEDICAL HISTORY: Past  Medical History:  Diagnosis Date   Allergy     Depression    Frequent headaches    IBS (irritable bowel syndrome)    Migraine    PTSD (post-traumatic stress disorder)    UTI (urinary tract infection)     MEDICATIONS: Current Outpatient Medications on File Prior to Visit  Medication Sig Dispense Refill   buPROPion (WELLBUTRIN XL) 300 MG 24 hr tablet Take 1 tablet (300 mg total) by mouth daily. 90 tablet 1   CRYSELLE-28 0.3-30 MG-MCG tablet TAKE 1 TABLET BY MOUTH EVERY DAY 84 tablet 1   dicyclomine (BENTYL) 10 MG capsule TAKE 1 CAPSULE (10 MG TOTAL) BY MOUTH 3 (THREE) TIMES DAILY BEFORE MEALS. (Patient taking differently: Take 10 mg by mouth 3 (three) times daily before meals. PRN) 270 capsule 0   Erenumab-aooe (AIMOVIG) 140 MG/ML SOAJ Inject 140 mg into the skin every 28 (twenty-eight) days. (Patient not taking: Reported on 08/08/2021) 1.12 mL 5   ondansetron (ZOFRAN-ODT) 4 MG disintegrating tablet Take 1 tablet (4 mg total) by mouth every 8 (eight) hours as needed for nausea or vomiting. (Patient not taking: Reported on 08/08/2021) 20 tablet 5   propranolol ER (INDERAL LA) 60 MG 24 hr capsule TAKE 1 CAPSULE BY MOUTH EVERY DAY 90 capsule 1   QULIPTA 60 MG TABS TAKE 1 TABLET BY MOUTH AT 2PM EVERY DAY 30 tablet 0   Rimegepant Sulfate (NURTEC) 75 MG TBDP Take 75 mg by mouth daily as needed. 8 tablet 5   rizatriptan (MAXALT-MLT) 10 MG disintegrating tablet Take 1 tablet earliest onset of migraine.  May repeat in 2 hours if needed.  Maximum 2 tablets in 24 hours. 10 tablet 5   traZODone (DESYREL) 50 MG tablet Take 1 tablet (50 mg total) by mouth at bedtime. 90 tablet 1   venlafaxine (EFFEXOR) 75 MG tablet TAKE 1 TABLET BY MOUTH EVERY DAY (Patient not taking: Reported on 08/08/2021) 90 tablet 1   venlafaxine XR (EFFEXOR-XR) 150 MG 24 hr capsule Take 1 capsule (150 mg total) by mouth daily. (Patient taking differently: Take 150 mg by mouth daily. 2 tab to total 300 mg) 90 capsule 1   No current facility-administered medications on file prior to  visit.    ALLERGIES: Allergies  Allergen Reactions   Imitrex [Sumatriptan] Palpitations   Lactose Other (See Comments)    GI Issues   Nickel Rash    FAMILY HISTORY: Family History  Problem Relation Age of Onset   Migraines Mother    Diabetes Father    Hyperlipidemia Father    Hypertension Father    Hearing loss Maternal Grandfather    Heart disease Maternal Grandfather    Early death Paternal Grandmother    Heart disease Paternal Grandmother    Hearing loss Paternal Grandfather    Cancer Paternal Grandfather    Cancer Sister    Birth defects Sister    Heart disease Sister    Alcohol abuse Brother       Objective:  Blood pressure 110/77, pulse 83, height 5' 6"  (1.676 m), weight 249 lb (112.9 kg), SpO2 97 %. General: No acute distress.  Patient appears well-groomed.   Head:  Normocephalic/atraumatic Eyes:  Fundi examined but not visualized Neck: supple, left paraspinal tenderness,reduced range of motion turning to left Heart:  Regular rate and rhythm Lungs:  Clear to auscultation bilaterally Back: No paraspinal tenderness Neurological Exam: alert and oriented to person, place, and time.  Speech fluent and not dysarthric, language intact.  CN II-XII  intact. Bulk and tone normal, muscle strength 5/5 throughout.  Sensation to light touch intact.  Deep tendon reflexes 2+ throughout, toes downgoing.  Finger to nose testing intact.  Gait normal, Romberg negative.   Metta Clines, DO  CC: Scarlette Calico, MD

## 2021-12-20 ENCOUNTER — Other Ambulatory Visit: Payer: Self-pay | Admitting: Internal Medicine

## 2021-12-20 DIAGNOSIS — R000 Tachycardia, unspecified: Secondary | ICD-10-CM

## 2021-12-20 DIAGNOSIS — G43009 Migraine without aura, not intractable, without status migrainosus: Secondary | ICD-10-CM

## 2021-12-22 ENCOUNTER — Ambulatory Visit: Payer: No Typology Code available for payment source | Admitting: Neurology

## 2021-12-23 ENCOUNTER — Encounter: Payer: Self-pay | Admitting: Neurology

## 2021-12-23 ENCOUNTER — Ambulatory Visit: Payer: No Typology Code available for payment source | Admitting: Neurology

## 2021-12-23 VITALS — BP 110/77 | HR 83 | Ht 66.0 in | Wt 249.0 lb

## 2021-12-23 DIAGNOSIS — M542 Cervicalgia: Secondary | ICD-10-CM | POA: Diagnosis not present

## 2021-12-23 DIAGNOSIS — G4486 Cervicogenic headache: Secondary | ICD-10-CM

## 2021-12-23 DIAGNOSIS — G43009 Migraine without aura, not intractable, without status migrainosus: Secondary | ICD-10-CM

## 2021-12-23 MED ORDER — QULIPTA 60 MG PO TABS: 60.0000 mg | ORAL_TABLET | Freq: Every day | ORAL | 11 refills | Status: DC

## 2021-12-23 MED ORDER — BACLOFEN 10 MG PO TABS: 10.0000 mg | ORAL_TABLET | Freq: Three times a day (TID) | ORAL | 5 refills | Status: DC | PRN

## 2021-12-23 NOTE — Addendum Note (Signed)
Addended by: Venetia Night on: 12/23/2021 04:02 PM   Modules accepted: Orders

## 2021-12-23 NOTE — Patient Instructions (Addendum)
Refer to physical therapy for neck pain Baclofen 57m up to three times daily as needed for neck spasms Continue Qulipta 678mdaily and propranolol ER 6030maily Nurtec as needed Follow up 4-5 months.

## 2021-12-30 ENCOUNTER — Encounter: Payer: Self-pay | Admitting: Neurology

## 2022-01-11 NOTE — Therapy (Unsigned)
OUTPATIENT PHYSICAL THERAPY CERVICAL EVALUATION   Patient Name: Erin Harris MRN: [id number] DOB:[birth year], 31 y.o., female Today's Date: 01/12/2022  END OF SESSION:  PT End of Session - 01/12/22 1723     Visit Number 1    Number of Visits 12    Date for PT Re-Evaluation 03/09/22    Authorization Type [employer]    PT Start Time 1625    PT Stop Time 1720    PT Time Calculation (min) 55 min    Activity Tolerance Patient tolerated treatment well    Behavior During Therapy WFL for tasks assessed/performed             Past Medical History:  Diagnosis Date   Allergy    Depression    Frequent headaches    IBS (irritable bowel syndrome)    Migraine    PTSD (post-traumatic stress disorder)    UTI (urinary tract infection)    Past Surgical History:  Procedure Laterality Date   ARTHROSCOPIC REPAIR ACL     CHOLECYSTECTOMY     WISDOM TOOTH EXTRACTION     Patient Active Problem List   Diagnosis Date Noted   Moderate persistent asthma with acute exacerbation 05/28/2020   NASH (nonalcoholic steatohepatitis) 02/21/2020   New daily persistent headache 02/21/2020   Hypokalemia, inadequate intake 02/20/2020   Elevated LFTs 02/20/2020   Tachycardia 02/19/2020   Migraine without aura and without status migrainosus, not intractable 02/19/2020   Encounter for general adult medical examination with abnormal findings 02/19/2020   Elevated prolactin level 02/19/2020    PCP: Janith Lima, MD   REFERRING PROVIDER: Pieter Partridge, DO  REFERRING DIAG: M54.2 (ICD-10-CM) - Cervicalgia  THERAPY DIAG: Cervicalgia  Rationale for Evaluation and Treatment: Rehabilitation  ONSET DATE: chronic  SUBJECTIVE:                                                                                                                                                                                                         SUBJECTIVE STATEMENT: Erin Harris is a 31 year old female with PTSD, depression,  IBS, tachycardia and migraine who follows up for migraine and neck pain.   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    - Severe headaches occurred only twice since last visit.  One time, it was different, involving severe sharp pain in the left eye.  Another time, she woke up central blurred vision in the left eye lasting 12 hours.  With Nurtec, severe pain lasts an hour.     - 15 dull frontal headache days a month (down from daily)  PERTINENT HISTORY:  Migraine without aura, without status migrainosus, intractable, improved Cervicogenic headache, improved Cervicalgia, likely myofascial   To address neck pain, start baclofen 48m TID PRN and refer to physical therapy Migraine prevention:  Qulipta 618mdaily  Continue propranolol ER 6013maily.  Also on venlafaxine XR 225m68mily for anxiety which also is helpful for migraine. Migraine rescue:  Nurtec Limit use of pain relievers to no more than 2 days out of week to prevent risk of rebound or medication-overuse headache. Keep headache diary Follow up 4 to 5 months.  PAIN:  Are you having pain? Yes: NPRS scale: 7/10 Pain location: cervical Pain description: ache Aggravating factors: prolonged positioning Relieving factors: Thera-gun, moist heat  PRECAUTIONS: None  WEIGHT BEARING RESTRICTIONS: No  FALLS:  Has patient fallen in last 6 months? No  LIVING ENVIRONMENT: Lives with: lives with their family  OCCUPATION: PA  PLOF: Independent  PATIENT GOALS: To resolve and manage my symptoms  NEXT MD VISIT: PRN  OBJECTIVE:   DIAGNOSTIC FINDINGS:  CLINICAL DATA:  Initial evaluation for chronic neck pain, cervicalgia.   EXAM: MRI CERVICAL SPINE WITHOUT CONTRAST   TECHNIQUE: Multiplanar,  multisequence MR imaging of the cervical spine was performed. No intravenous contrast was administered.   COMPARISON:  None available.   FINDINGS: Alignment: Straightening with slight reversal of the normal cervical lordosis. No significant listhesis.   Vertebrae: Vertebral body height maintained without acute or chronic fracture. Bone marrow signal intensity within normal limits. No discrete or worrisome osseous lesions. No abnormal marrow edema.   Cord: Focal prominence of the central canal versus tiny syrinx seen at the level of C6-7 (series 5, image 31). This measures up to 2 mm in maximal diameter. Signal intensity within the visualized cord is otherwise within normal limits.   Posterior Fossa, vertebral arteries, paraspinal tissues: Unremarkable.   Disc levels:   C2-C3: Unremarkable.   C3-C4: Minimal disc bulge. No spinal stenosis. Foramina remain patent.   C4-C5: Minimal disc bulge with endplate spurring. No significant spinal stenosis.   Remain patent.   C5-C6: Minimal disc bulge with superimposed tiny central disc protrusion. No significant spinal stenosis. Foramina remain patent.   C6-C7: Degenerative intervertebral disc space narrowing with diffuse disc osteophyte complex. Flattening and partial effacement of the ventral thecal sac with resultant mild spinal stenosis. Secondary mild cord flattening. Mild left C7 foraminal stenosis. Right neural foramen remains patent.   C7-T1:  Seen only on sagittal projection.  Unremarkable.   Visualized upper thoracic spine demonstrates no significant finding.   IMPRESSION: 1. 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 2. Additional mild noncompressive disc bulging at C3-4 through C5-6 without significant stenosis or neural impingement. 3. Focal prominence of the central canal versus tiny syrinx at the level of C6-7.     Electronically Signed   By: BenjJeannine Boga.   On: 09/11/2021 17:18  PATIENT SURVEYS:  FOTO 48(71 predicted)  COGNITION: Overall cognitive status: Within functional limits for tasks assessed  SENSATION: Not tested  POSTURE: rounded shoulders  PALPATION: TTP L scalenes   CERVICAL ROM:   Active ROM A/PROM (deg) eval  Flexion 90%  Extension 90%  Right lateral flexion 90%  Left lateral flexion 90%  Right  rotation 90%  Left rotation 90%   (Blank rows = not tested)  UPPER EXTREMITY ROM: WNL throughout  Active ROM Right eval Left eval  Shoulder flexion    Shoulder extension    Shoulder abduction    Shoulder adduction    Shoulder extension    Shoulder internal rotation    Shoulder external rotation    Elbow flexion    Elbow extension    Wrist flexion    Wrist extension    Wrist ulnar deviation    Wrist radial deviation    Wrist pronation    Wrist supination     (Blank rows = not tested)  UPPER EXTREMITY MMT: WNL throughout  MMT Right eval Left eval  Shoulder flexion    Shoulder extension    Shoulder abduction    Shoulder adduction    Shoulder extension    Shoulder internal rotation    Shoulder external rotation    Middle trapezius    Lower trapezius    Elbow flexion    Elbow extension    Wrist flexion    Wrist extension    Wrist ulnar deviation    Wrist radial deviation    Wrist pronation    Wrist supination    Grip strength     (Blank rows = not tested)  CERVICAL SPECIAL TESTS:  Neck flexor muscle endurance test: Negative and Distraction test: Negative  FUNCTIONAL TESTS:  N/A  TODAY'S TREATMENT:                                                                                                                              DATE: 01/12/22   PATIENT EDUCATION:  Education details: Discussed eval findings, rehab rationale and POC and patient is in agreement  Person educated: Patient Education method: Explanation Education comprehension: verbalized understanding and needs  further education  HOME EXERCISE PROGRAM: Access Code: [email or web address] URL: [email or web address] Date: 01/12/2022 Prepared by: Sharlynn Oliphant  Exercises - Seated Scalenes Stretch  - 1 x daily - 5 x weekly - 1 sets - 3 reps - 30s hold - Supine Cervical Sidebending Stretch  - 1 x daily - 5 x weekly - 1 sets - 1 reps - 30s hold - Supine Anterior Scalene Stretch  - 1 x daily - 5 x weekly - 1 sets - 1 reps - 30s hold - Supine Posterior Scalene Stretch  - 1 x daily - 5 x weekly - 1 sets - 1 reps - 30s hold  ASSESSMENT:  CLINICAL IMPRESSION: Patient is a 31 y.o. female who was seen today for physical therapy evaluation and treatment for cervical pain.   OBJECTIVE IMPAIRMENTS: decreased mobility, decreased ROM, increased fascial restrictions, increased muscle spasms, impaired flexibility, improper body mechanics, postural dysfunction, and pain.   ACTIVITY LIMITATIONS: carrying, bending, and prolonged positions  PERSONAL FACTORS: Age, Fitness, Past/current experiences, Time since onset of injury/illness/exacerbation, and 1 comorbidity: migraines  are also affecting patient's functional outcome.   REHAB  POTENTIAL: Good  CLINICAL DECISION MAKING: Evolving/moderate complexity  EVALUATION COMPLEXITY: Moderate   GOALS: Goals reviewed with patient? No  SHORT TERM GOALS: Target date: ***  *** Baseline: *** Goal status: {GOALSTATUS:25110}  2.  *** Baseline: *** Goal status: {GOALSTATUS:25110}  3.  *** Baseline: *** Goal status: {GOALSTATUS:25110}  4.  *** Baseline: *** Goal status: {GOALSTATUS:25110}  5.  *** Baseline: *** Goal status: {GOALSTATUS:25110}  6.  *** Baseline: *** Goal status: {GOALSTATUS:25110}  LONG TERM GOALS: Target date: ***  *** Baseline: *** Goal status: {GOALSTATUS:25110}  2.  *** Baseline: *** Goal status: {GOALSTATUS:25110}  3.  *** Baseline: *** Goal status: {GOALSTATUS:25110}  4.  *** Baseline: *** Goal status:  {GOALSTATUS:25110}  5.  *** Baseline: *** Goal status: {GOALSTATUS:25110}  6.  *** Baseline: *** Goal status: {GOALSTATUS:25110}   PLAN:  PT FREQUENCY: 1-2x/week  PT DURATION: 6 weeks  PLANNED INTERVENTIONS: Therapeutic exercises, Therapeutic activity, Neuromuscular re-education, Balance training, Gait training, Patient/Family education, Self Care, Joint mobilization, Joint manipulation, Dry Needling, Spinal manipulation, Spinal mobilization, Manual therapy, and Re-evaluation  PLAN FOR NEXT SESSION: HEP review and update, posture training, manual techniques, scalene stretching, TPDN   Lanice Shirts, PT 01/12/2022, 5:25 PM

## 2022-01-12 ENCOUNTER — Ambulatory Visit: Payer: No Typology Code available for payment source | Attending: Neurology

## 2022-01-12 ENCOUNTER — Other Ambulatory Visit: Payer: Self-pay

## 2022-01-12 DIAGNOSIS — Z8669 Personal history of other diseases of the nervous system and sense organs: Secondary | ICD-10-CM | POA: Insufficient documentation

## 2022-01-12 DIAGNOSIS — M542 Cervicalgia: Secondary | ICD-10-CM | POA: Diagnosis present

## 2022-01-21 ENCOUNTER — Ambulatory Visit: Payer: No Typology Code available for payment source

## 2022-01-21 DIAGNOSIS — Z8669 Personal history of other diseases of the nervous system and sense organs: Secondary | ICD-10-CM

## 2022-01-21 DIAGNOSIS — M542 Cervicalgia: Secondary | ICD-10-CM

## 2022-01-21 NOTE — Therapy (Signed)
OUTPATIENT PHYSICAL THERAPY TREATMENT NOTE   Patient Name: Erin Harris MRN: [id number] DOB:July 17, 1990, 31 y.o., female Today's Date: 01/21/2022  PCP: Janith Lima, MD  REFERRING PROVIDER: Pieter Partridge, DO   END OF SESSION:   PT End of Session - 01/21/22 1223     Visit Number 2    Number of Visits 12    Date for PT Re-Evaluation 03/09/22    Authorization Type [employer]    PT Start Time 1223    PT Stop Time 1258   4 minute TDN   PT Time Calculation (min) 35 min    Activity Tolerance Patient tolerated treatment well    Behavior During Therapy WFL for tasks assessed/performed             Past Medical History:  Diagnosis Date   Allergy    Depression    Frequent headaches    IBS (irritable bowel syndrome)    Migraine    PTSD (post-traumatic stress disorder)    UTI (urinary tract infection)    Past Surgical History:  Procedure Laterality Date   ARTHROSCOPIC REPAIR ACL     CHOLECYSTECTOMY     WISDOM TOOTH EXTRACTION     Patient Active Problem List   Diagnosis Date Noted   Moderate persistent asthma with acute exacerbation 05/28/2020   NASH (nonalcoholic steatohepatitis) 02/21/2020   New daily persistent headache 02/21/2020   Hypokalemia, inadequate intake 02/20/2020   Elevated LFTs 02/20/2020   Tachycardia 02/19/2020   Migraine without aura and without status migrainosus, not intractable 02/19/2020   Encounter for general adult medical examination with abnormal findings 02/19/2020   Elevated prolactin level 02/19/2020    REFERRING DIAG: M54.2 (ICD-10-CM) - Cervicalgia   THERAPY DIAG:  Cervicalgia  History of migraine headaches  Rationale for Evaluation and Treatment Rehabilitation  PERTINENT HISTORY:  Migraine without aura, without status migrainosus, intractable, improved Cervicogenic headache, improved Cervicalgia, likely myofascial   SUBJECTIVE:                                                                                                                                                                                       SUBJECTIVE STATEMENT:  Pt reports increased Lt UT and arm pain following her eval that lasted for 5 days, which she attributes to techniques used at the eval. She says these symptoms have since resolved.    PAIN:  Are you having pain? Yes: NPRS scale: 4/10 Pain location: cervical Pain description: ache Aggravating factors: prolonged positioning Relieving factors: Thera-gun, moist heat   OBJECTIVE: (objective measures completed at initial evaluation unless otherwise dated)   DIAGNOSTIC FINDINGS:  CLINICAL DATA:  Initial evaluation for chronic  neck pain, cervicalgia.   EXAM: MRI CERVICAL SPINE WITHOUT CONTRAST   TECHNIQUE: Multiplanar, multisequence MR imaging of the cervical spine was performed. No intravenous contrast was administered.   COMPARISON:  None available.   FINDINGS: Alignment: Straightening with slight reversal of the normal cervical lordosis. No significant listhesis.   Vertebrae: Vertebral body height maintained without acute or chronic fracture. Bone marrow signal intensity within normal limits. No discrete or worrisome osseous lesions. No abnormal marrow edema.   Cord: Focal prominence of the central canal versus tiny syrinx seen at the level of C6-7 (series 5, image 31). This measures up to 2 mm in maximal diameter. Signal intensity within the visualized cord is otherwise within normal limits.   Posterior Fossa, vertebral arteries, paraspinal tissues: Unremarkable.   Disc levels:   C2-C3: Unremarkable.   C3-C4: Minimal disc bulge. No spinal stenosis. Foramina remain patent.   C4-C5: Minimal disc bulge with endplate spurring. No significant spinal stenosis.   Remain patent.   C5-C6: Minimal disc bulge with superimposed tiny central disc protrusion. No significant spinal stenosis. Foramina remain patent.   C6-C7: Degenerative intervertebral disc space narrowing with  diffuse disc osteophyte complex. Flattening and partial effacement of the ventral thecal sac with resultant mild spinal stenosis. Secondary mild cord flattening. Mild left C7 foraminal stenosis. Right neural foramen remains patent.   C7-T1:  Seen only on sagittal projection.  Unremarkable.   Visualized upper thoracic spine demonstrates no significant finding.   IMPRESSION: 1. 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 2. Additional mild noncompressive disc bulging at C3-4 through C5-6 without significant stenosis or neural impingement. 3. Focal prominence of the central canal versus tiny syrinx at the level of C6-7.     Electronically Signed   By: Jeannine Boga M.D.   On: 09/11/2021 17:18   PATIENT SURVEYS:  FOTO 48(71 predicted)   COGNITION: Overall cognitive status: Within functional limits for tasks assessed   SENSATION: Not tested   POSTURE: rounded shoulders   PALPATION: TTP L scalenes           CERVICAL ROM:    Active ROM A/PROM (deg) eval  Flexion 90%  Extension 90%  Right lateral flexion 90%  Left lateral flexion 90%  Right rotation 90%  Left rotation 90%   (Blank rows = not tested)   UPPER EXTREMITY ROM: WNL throughout   Active ROM Right eval Left eval  Shoulder flexion      Shoulder extension      Shoulder abduction      Shoulder adduction      Shoulder extension      Shoulder internal rotation      Shoulder external rotation      Elbow flexion      Elbow extension      Wrist flexion      Wrist extension      Wrist ulnar deviation      Wrist radial deviation      Wrist pronation      Wrist supination       (Blank rows = not tested)   UPPER EXTREMITY MMT: WNL throughout   MMT Right eval Left eval  Shoulder flexion      Shoulder extension      Shoulder abduction      Shoulder adduction      Shoulder extension      Shoulder internal rotation      Shoulder external  rotation  Middle trapezius      Lower trapezius      Elbow flexion      Elbow extension      Wrist flexion      Wrist extension      Wrist ulnar deviation      Wrist radial deviation      Wrist pronation      Wrist supination      Grip strength       (Blank rows = not tested)   CERVICAL SPECIAL TESTS:  Neck flexor muscle endurance test: Negative and Distraction test: Negative   FUNCTIONAL TESTS:  N/A   TODAY'S TREATMENT:                                                                               [facility] Adult PT Treatment:                                                DATE: 01/21/2022 Therapeutic Exercise: Seated low rows with 35# 2x10 Seated high rows with 35# 2x10 Seated lat pull-downs with 35# 2x10 Seated shoulder rolls 2x10 forward and backward Standing BIL scaption lifts with 4# dumbbells with chin tuck into ball at wall 3x10 Manual Therapy: Skilled palpation to identify trigger points prior to TDN STM/ effleurage to Lt suboccipitals/ cervical paraspinals/ mid trap/ upper trap following TDN x13 min Prone CT junction grade V manipulation x1 BIL with cavitation Prone thoracic grade V maniplation x5 throughout thoracic spine with cavitation  Trigger Point Dry-Needling  Treatment instructions: Expect mild to moderate muscle soreness. S/S of pneumothorax if dry needled over a lung field, and to seek immediate medical attention should they occur. Patient verbalized understanding of these instructions and education.  Patient Consent Given: Yes Education handout provided: No Muscles treated: Lt upper trap Electrical stimulation performed: No Parameters: N/A Treatment response/outcome: Multiple twitch responses, improved muscle extensibility, improved pain from 4/10 to 0/10  Neuromuscular re-ed: N/A Therapeutic Activity: N/A Modalities: N/A Self Care: N/A    PATIENT EDUCATION:  Education details: Discussed eval findings, rehab rationale and POC and patient is in  agreement  Person educated: Patient Education method: Explanation Education comprehension: verbalized understanding and needs further education   HOME EXERCISE PROGRAM: Access Code: [email or web address] URL: [email or web address] Date: 01/12/2022 Prepared by: Sharlynn Oliphant   Exercises - Seated Scalenes Stretch  - 1 x daily - 5 x weekly - 1 sets - 3 reps - 30s hold - Supine Cervical Sidebending Stretch  - 1 x daily - 5 x weekly - 1 sets - 1 reps - 30s hold - Supine Anterior Scalene Stretch  - 1 x daily - 5 x weekly - 1 sets - 1 reps - 30s hold - Supine Posterior Scalene Stretch  - 1 x daily - 5 x weekly - 1 sets - 1 reps - 30s hold  Added 01/21/2022: - Standing Shoulder Row with Anchored Resistance  - 1 x daily - 7 x weekly - 3 sets - 10 reps - Seated Cervical Retraction  - 1 x daily - 7 x  weekly - 2 sets - 10 reps - 5 seconds hold   ASSESSMENT:   CLINICAL IMPRESSION: Pt responded excellently to all interventions today. She reports a therapeutic response to manual therapy, OMT, and TDN today, reporting a decrease in pain from 4/10 to 0/10 following these interventions. Additionally, she performed all exercises with good form and no increase in pain. The pt will continue to benefit from skilled PT to address her primary impairments and return to her prior level of function with less limitation.   OBJECTIVE IMPAIRMENTS: decreased mobility, decreased ROM, increased fascial restrictions, increased muscle spasms, impaired flexibility, improper body mechanics, postural dysfunction, and pain.    ACTIVITY LIMITATIONS: carrying, bending, and prolonged positions   PERSONAL FACTORS: Age, Fitness, Past/current experiences, Time since onset of injury/illness/exacerbation, and 1 comorbidity: migraines  are also affecting patient's functional outcome.        GOALS: Goals reviewed with patient? No   SHORT TERM GOALS: Target date: 02/03/2022     Patient to demonstrate independence in HEP   Baseline: [email or web address] Goal status: INITIAL       LONG TERM GOALS: Target date: 02/24/2022     Decrease worst pain to 4/10 Baseline: 7/10 Goal status: INITIAL   2.  Decrease L scalene group tenderness/tightness to minimal Baseline: Moderate tenderness/tightness to L scalene group Goal status: INITIAL   3.  Increase FOTO score to 71 Baseline: 48 Goal status: INITIAL         PLAN:   PT FREQUENCY: 1-2x/week   PT DURATION: 6 weeks   PLANNED INTERVENTIONS: Therapeutic exercises, Therapeutic activity, Neuromuscular re-education, Balance training, Gait training, Patient/Family education, Self Care, Joint mobilization, Joint manipulation, Dry Needling, Spinal manipulation, Spinal mobilization, Manual therapy, and Re-evaluation   PLAN FOR NEXT SESSION: HEP review and update, posture training, manual techniques, scalene stretching, TPDN, first rib mobilization   Vanessa Hopewell, PT, DPT 01/21/22 12:59 PM

## 2022-01-27 ENCOUNTER — Ambulatory Visit: Payer: No Typology Code available for payment source | Attending: Neurology

## 2022-01-27 DIAGNOSIS — Z8669 Personal history of other diseases of the nervous system and sense organs: Secondary | ICD-10-CM

## 2022-01-27 DIAGNOSIS — M542 Cervicalgia: Secondary | ICD-10-CM

## 2022-01-27 NOTE — Therapy (Signed)
OUTPATIENT PHYSICAL THERAPY TREATMENT NOTE   Patient Name: Erin Harris MRN: [id number] DOB:1990/11/25, 32 y.o., female Today's Date: 01/27/2022  PCP: Etta Grandchild, MD  REFERRING PROVIDER: Drema Dallas, DO   END OF SESSION:   PT End of Session - 01/27/22 1549     Visit Number 3    Number of Visits 12    Date for PT Re-Evaluation 03/09/22    Authorization Type [employer]    PT Start Time 1550    PT Stop Time 1628    PT Time Calculation (min) 38 min    Activity Tolerance Patient tolerated treatment well    Behavior During Therapy WFL for tasks assessed/performed              Past Medical History:  Diagnosis Date   Allergy    Depression    Frequent headaches    IBS (irritable bowel syndrome)    Migraine    PTSD (post-traumatic stress disorder)    UTI (urinary tract infection)    Past Surgical History:  Procedure Laterality Date   ARTHROSCOPIC REPAIR ACL     CHOLECYSTECTOMY     WISDOM TOOTH EXTRACTION     Patient Active Problem List   Diagnosis Date Noted   Moderate persistent asthma with acute exacerbation 05/28/2020   NASH (nonalcoholic steatohepatitis) 02/21/2020   New daily persistent headache 02/21/2020   Hypokalemia, inadequate intake 02/20/2020   Elevated LFTs 02/20/2020   Tachycardia 02/19/2020   Migraine without aura and without status migrainosus, not intractable 02/19/2020   Encounter for general adult medical examination with abnormal findings 02/19/2020   Elevated prolactin level 02/19/2020    REFERRING DIAG: M54.2 (ICD-10-CM) - Cervicalgia   THERAPY DIAG:  Cervicalgia  History of migraine headaches  Rationale for Evaluation and Treatment Rehabilitation  PERTINENT HISTORY:  Migraine without aura, without status migrainosus, intractable, improved Cervicogenic headache, improved Cervicalgia, likely myofascial   SUBJECTIVE:                                                                                                                                                                                       SUBJECTIVE STATEMENT:  Pt reports a positive response to TDN at last session, reporting remission of pain the next few days. However, she reports she had a bad night of sleep last night and has minor pain today. She also reports 3-4 days of HEP adherence.    PAIN:  Are you having pain? Yes: NPRS scale: 2-3/10 Pain location: cervical Pain description: ache Aggravating factors: prolonged positioning Relieving factors: Thera-gun, moist heat   OBJECTIVE: (objective measures completed at initial evaluation unless otherwise dated)   DIAGNOSTIC FINDINGS:  CLINICAL DATA:  Initial evaluation for chronic neck pain, cervicalgia.   EXAM: MRI CERVICAL SPINE WITHOUT CONTRAST   TECHNIQUE: Multiplanar, multisequence MR imaging of the cervical spine was performed. No intravenous contrast was administered.   COMPARISON:  None available.   FINDINGS: Alignment: Straightening with slight reversal of the normal cervical lordosis. No significant listhesis.   Vertebrae: Vertebral body height maintained without acute or chronic fracture. Bone marrow signal intensity within normal limits. No discrete or worrisome osseous lesions. No abnormal marrow edema.   Cord: Focal prominence of the central canal versus tiny syrinx seen at the level of C6-7 (series 5, image 31). This measures up to 2 mm in maximal diameter. Signal intensity within the visualized cord is otherwise within normal limits.   Posterior Fossa, vertebral arteries, paraspinal tissues: Unremarkable.   Disc levels:   C2-C3: Unremarkable.   C3-C4: Minimal disc bulge. No spinal stenosis. Foramina remain patent.   C4-C5: Minimal disc bulge with endplate spurring. No significant spinal stenosis.   Remain patent.   C5-C6: Minimal disc bulge with superimposed tiny central disc protrusion. No significant spinal stenosis. Foramina remain patent.   C6-C7: Degenerative  intervertebral disc space narrowing with diffuse disc osteophyte complex. Flattening and partial effacement of the ventral thecal sac with resultant mild spinal stenosis. Secondary mild cord flattening. Mild left C7 foraminal stenosis. Right neural foramen remains patent.   C7-T1:  Seen only on sagittal projection.  Unremarkable.   Visualized upper thoracic spine demonstrates no significant finding.   IMPRESSION: 1. 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 2. Additional mild noncompressive disc bulging at C3-4 through C5-6 without significant stenosis or neural impingement. 3. Focal prominence of the central canal versus tiny syrinx at the level of C6-7.     Electronically Signed   By: Jeannine Boga M.D.   On: 09/11/2021 17:18   PATIENT SURVEYS:  FOTO 48(71 predicted)   COGNITION: Overall cognitive status: Within functional limits for tasks assessed   SENSATION: Not tested   POSTURE: rounded shoulders   PALPATION: TTP L scalenes           CERVICAL ROM:    Active ROM A/PROM (deg) eval  Flexion 90%  Extension 90%  Right lateral flexion 90%  Left lateral flexion 90%  Right rotation 90%  Left rotation 90%   (Blank rows = not tested)   UPPER EXTREMITY ROM: WNL throughout   Active ROM Right eval Left eval  Shoulder flexion      Shoulder extension      Shoulder abduction      Shoulder adduction      Shoulder extension      Shoulder internal rotation      Shoulder external rotation      Elbow flexion      Elbow extension      Wrist flexion      Wrist extension      Wrist ulnar deviation      Wrist radial deviation      Wrist pronation      Wrist supination       (Blank rows = not tested)   UPPER EXTREMITY MMT: WNL throughout   MMT Right eval Left eval  Shoulder flexion      Shoulder extension      Shoulder abduction      Shoulder adduction      Shoulder extension      Shoulder  internal rotation  Shoulder external rotation      Middle trapezius      Lower trapezius      Elbow flexion      Elbow extension      Wrist flexion      Wrist extension      Wrist ulnar deviation      Wrist radial deviation      Wrist pronation      Wrist supination      Grip strength       (Blank rows = not tested)   CERVICAL SPECIAL TESTS:  Neck flexor muscle endurance test: Negative and Distraction test: Negative   FUNCTIONAL TESTS:  N/A   TODAY'S TREATMENT:        [facility] Adult PT Treatment:                                                DATE: 01/27/2022 Therapeutic Exercise: Seated serratus punches with 45# 3x15 Standing BIL shoulder extension with 7# cables 3x10 Standing arm circle angels 2x5 Standing BIL scaption lifts with 4# dumbbells with chin tuck into ball at wall 3x10 Seated lat pull-down with chin tuck with 45# 3x10 Seated combined end-range cervical rotation/ extension 2x10 BIL Seated upper trap stretch x37min BIL Seated levator scap stretch x81min BIL Manual Therapy: Prone CT junction grade V manipulation x1 BIL with cavitation Prone thoracic grade V maniplation x5 throughout thoracic spine with cavitation Neuromuscular re-ed: N/A Therapeutic Activity: N/A Modalities: N/A Self Care: N/A                                                                           [facility] Adult PT Treatment:                                                DATE: 01/21/2022 Therapeutic Exercise: Seated low rows with 35# 2x10 Seated high rows with 35# 2x10 Seated lat pull-downs with 35# 2x10 Seated shoulder rolls 2x10 forward and backward Standing BIL scaption lifts with 4# dumbbells with chin tuck into ball at wall 3x10 Manual Therapy: Skilled palpation to identify trigger points prior to TDN STM/ effleurage to Lt suboccipitals/ cervical paraspinals/ mid trap/ upper trap following TDN x13 min Prone CT junction grade V manipulation x1 BIL with cavitation Prone thoracic grade  V maniplation x5 throughout thoracic spine with cavitation  Trigger Point Dry-Needling  Treatment instructions: Expect mild to moderate muscle soreness. S/S of pneumothorax if dry needled over a lung field, and to seek immediate medical attention should they occur. Patient verbalized understanding of these instructions and education.  Patient Consent Given: Yes Education handout provided: No Muscles treated: Lt upper trap Electrical stimulation performed: No Parameters: N/A Treatment response/outcome: Multiple twitch responses, improved muscle extensibility, improved pain from 4/10 to 0/10  Neuromuscular re-ed: N/A Therapeutic Activity: N/A Modalities: N/A Self Care: N/A    PATIENT EDUCATION:  Education details: Discussed eval findings, rehab rationale and POC and patient is in  agreement  Person educated: Patient Education method: Explanation Education comprehension: verbalized understanding and needs further education   HOME EXERCISE PROGRAM: Access Code: [email or web address] URL: [email or web address] Date: 01/12/2022 Prepared by: Sharlynn Oliphant   Exercises - Seated Scalenes Stretch  - 1 x daily - 5 x weekly - 1 sets - 3 reps - 30s hold - Supine Cervical Sidebending Stretch  - 1 x daily - 5 x weekly - 1 sets - 1 reps - 30s hold - Supine Anterior Scalene Stretch  - 1 x daily - 5 x weekly - 1 sets - 1 reps - 30s hold - Supine Posterior Scalene Stretch  - 1 x daily - 5 x weekly - 1 sets - 1 reps - 30s hold  Added 01/21/2022: - Standing Shoulder Row with Anchored Resistance  - 1 x daily - 7 x weekly - 3 sets - 10 reps - Seated Cervical Retraction  - 1 x daily - 7 x weekly - 2 sets - 10 reps - 5 seconds hold   ASSESSMENT:   CLINICAL IMPRESSION: Due to pt arriving 17 minutes late to her appointment, the session was truncated today. She responded well to all interventions today, demonstrating good form and no increase in pain with selected exercises. She will continue to  benefit from skilled PT to address her primary impairments and return to her prior level of function with less limitation.   OBJECTIVE IMPAIRMENTS: decreased mobility, decreased ROM, increased fascial restrictions, increased muscle spasms, impaired flexibility, improper body mechanics, postural dysfunction, and pain.    ACTIVITY LIMITATIONS: carrying, bending, and prolonged positions   PERSONAL FACTORS: Age, Fitness, Past/current experiences, Time since onset of injury/illness/exacerbation, and 1 comorbidity: migraines  are also affecting patient's functional outcome.        GOALS: Goals reviewed with patient? No   SHORT TERM GOALS: Target date: 02/03/2022     Patient to demonstrate independence in HEP  Baseline: [id number] Goal status: INITIAL       LONG TERM GOALS: Target date: 02/24/2022     Decrease worst pain to 4/10 Baseline: 7/10 Goal status: INITIAL   2.  Decrease L scalene group tenderness/tightness to minimal Baseline: Moderate tenderness/tightness to L scalene group Goal status: INITIAL   3.  Increase FOTO score to 71 Baseline: 48 Goal status: INITIAL         PLAN:   PT FREQUENCY: 1-2x/week   PT DURATION: 6 weeks   PLANNED INTERVENTIONS: Therapeutic exercises, Therapeutic activity, Neuromuscular re-education, Balance training, Gait training, Patient/Family education, Self Care, Joint mobilization, Joint manipulation, Dry Needling, Spinal manipulation, Spinal mobilization, Manual therapy, and Re-evaluation   PLAN FOR NEXT SESSION: HEP review and update, posture training, manual techniques, scalene stretching, TPDN, first rib mobilization   Vanessa [place], PT, DPT 01/27/22 4:29 PM

## 2022-02-03 ENCOUNTER — Ambulatory Visit: Payer: No Typology Code available for payment source

## 2022-02-03 DIAGNOSIS — Z8669 Personal history of other diseases of the nervous system and sense organs: Secondary | ICD-10-CM

## 2022-02-03 DIAGNOSIS — M542 Cervicalgia: Secondary | ICD-10-CM

## 2022-02-03 NOTE — Therapy (Signed)
OUTPATIENT PHYSICAL THERAPY TREATMENT NOTE   Patient Name: Erin Harris MRN: [id number] DOB:24-Mar-1990, 32 y.o., female Today's Date: 02/03/2022  PCP: Etta Grandchild, MD  REFERRING PROVIDER: Drema Dallas, DO   END OF SESSION:   PT End of Session - 02/03/22 1526     Visit Number 4    Number of Visits 12    Date for PT Re-Evaluation 03/09/22    Authorization Type [employer]    PT Start Time 1528    PT Stop Time 1615   5 minutes TDN insertion   PT Time Calculation (min) 47 min    Activity Tolerance Patient tolerated treatment well    Behavior During Therapy WFL for tasks assessed/performed               Past Medical History:  Diagnosis Date   Allergy    Depression    Frequent headaches    IBS (irritable bowel syndrome)    Migraine    PTSD (post-traumatic stress disorder)    UTI (urinary tract infection)    Past Surgical History:  Procedure Laterality Date   ARTHROSCOPIC REPAIR ACL     CHOLECYSTECTOMY     WISDOM TOOTH EXTRACTION     Patient Active Problem List   Diagnosis Date Noted   Moderate persistent asthma with acute exacerbation 05/28/2020   NASH (nonalcoholic steatohepatitis) 02/21/2020   New daily persistent headache 02/21/2020   Hypokalemia, inadequate intake 02/20/2020   Elevated LFTs 02/20/2020   Tachycardia 02/19/2020   Migraine without aura and without status migrainosus, not intractable 02/19/2020   Encounter for general adult medical examination with abnormal findings 02/19/2020   Elevated prolactin level 02/19/2020    REFERRING DIAG: M54.2 (ICD-10-CM) - Cervicalgia   THERAPY DIAG:  Cervicalgia  History of migraine headaches  Rationale for Evaluation and Treatment Rehabilitation  PERTINENT HISTORY:  Migraine without aura, without status migrainosus, intractable, improved Cervicogenic headache, improved Cervicalgia, likely myofascial   SUBJECTIVE:                                                                                                                                                                                       SUBJECTIVE STATEMENT:  Pt reports 2-3/10 pain today and adds that she has been adherent to her HEP.   PAIN:  Are you having pain? Yes: NPRS scale: 2-3/10 Pain location: cervical Pain description: ache Aggravating factors: prolonged positioning Relieving factors: Thera-gun, moist heat   OBJECTIVE: (objective measures completed at initial evaluation unless otherwise dated)   DIAGNOSTIC FINDINGS:  CLINICAL DATA:  Initial evaluation for chronic neck pain, cervicalgia.   EXAM: MRI CERVICAL SPINE WITHOUT CONTRAST   TECHNIQUE: Multiplanar,  multisequence MR imaging of the cervical spine was performed. No intravenous contrast was administered.   COMPARISON:  None available.   FINDINGS: Alignment: Straightening with slight reversal of the normal cervical lordosis. No significant listhesis.   Vertebrae: Vertebral body height maintained without acute or chronic fracture. Bone marrow signal intensity within normal limits. No discrete or worrisome osseous lesions. No abnormal marrow edema.   Cord: Focal prominence of the central canal versus tiny syrinx seen at the level of C6-7 (series 5, image 31). This measures up to 2 mm in maximal diameter. Signal intensity within the visualized cord is otherwise within normal limits.   Posterior Fossa, vertebral arteries, paraspinal tissues: Unremarkable.   Disc levels:   C2-C3: Unremarkable.   C3-C4: Minimal disc bulge. No spinal stenosis. Foramina remain patent.   C4-C5: Minimal disc bulge with endplate spurring. No significant spinal stenosis.   Remain patent.   C5-C6: Minimal disc bulge with superimposed tiny central disc protrusion. No significant spinal stenosis. Foramina remain patent.   C6-C7: Degenerative intervertebral disc space narrowing with diffuse disc osteophyte complex. Flattening and partial effacement of the ventral  thecal sac with resultant mild spinal stenosis. Secondary mild cord flattening. Mild left C7 foraminal stenosis. Right neural foramen remains patent.   C7-T1:  Seen only on sagittal projection.  Unremarkable.   Visualized upper thoracic spine demonstrates no significant finding.   IMPRESSION: 1. 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 2. Additional mild noncompressive disc bulging at C3-4 through C5-6 without significant stenosis or neural impingement. 3. Focal prominence of the central canal versus tiny syrinx at the level of C6-7.     Electronically Signed   By: Jeannine Boga M.D.   On: 09/11/2021 17:18   PATIENT SURVEYS:  FOTO 48(71 predicted)   COGNITION: Overall cognitive status: Within functional limits for tasks assessed   SENSATION: Not tested   POSTURE: rounded shoulders   PALPATION: TTP L scalenes           CERVICAL ROM:    Active ROM A/PROM (deg) eval  Flexion 90%  Extension 90%  Right lateral flexion 90%  Left lateral flexion 90%  Right rotation 90%  Left rotation 90%   (Blank rows = not tested)   UPPER EXTREMITY ROM: WNL throughout   Active ROM Right eval Left eval  Shoulder flexion      Shoulder extension      Shoulder abduction      Shoulder adduction      Shoulder extension      Shoulder internal rotation      Shoulder external rotation      Elbow flexion      Elbow extension      Wrist flexion      Wrist extension      Wrist ulnar deviation      Wrist radial deviation      Wrist pronation      Wrist supination       (Blank rows = not tested)   UPPER EXTREMITY MMT: WNL throughout   MMT Right eval Left eval  Shoulder flexion      Shoulder extension      Shoulder abduction      Shoulder adduction      Shoulder extension      Shoulder internal rotation      Shoulder external rotation      Middle trapezius      Lower trapezius  Elbow flexion      Elbow  extension      Wrist flexion      Wrist extension      Wrist ulnar deviation      Wrist radial deviation      Wrist pronation      Wrist supination      Grip strength       (Blank rows = not tested)   CERVICAL SPECIAL TESTS:  Neck flexor muscle endurance test: Negative and Distraction test: Negative   FUNCTIONAL TESTS:  N/A   TODAY'S TREATMENT:        [facility] Adult PT Treatment:                                                DATE: 02/03/2022 Therapeutic Exercise: Standing low rows with two 10# cables 3x10 Standing shoulder rolls 2x10 forward and backward Standing mini-lunge push-pull with two 10# cables 2x10 BIL Standing lat pull-downs with two 20# cables 3x10 Manual Therapy: Skilled palpation to identify trigger points prior to TDN STM/ effleurage to Lt suboccipitals/ cervical paraspinals/ mid trap/ upper trap following TDN x13 min Prone CT junction grade V manipulation x1 BIL with cavitation Prone thoracic grade V maniplation x5 throughout thoracic spine with cavitation  Trigger Point Dry-Needling  Treatment instructions: Expect mild to moderate muscle soreness. S/S of pneumothorax if dry needled over a lung field, and to seek immediate medical attention should they occur. Patient verbalized understanding of these instructions and education.  Patient Consent Given: Yes Education handout provided: No Muscles treated: Lt upper trap Electrical stimulation performed: No Parameters: N/A Treatment response/outcome: Multiple twitch responses, improved muscle extensibility, improved pain from 2-3/10 to 1/10 Neuromuscular re-ed: N/A Therapeutic Activity: N/A Modalities: N/A Self Care: N/A   [facility] Adult PT Treatment:                                                DATE: 01/27/2022 Therapeutic Exercise: Seated serratus punches with 45# 3x15 Standing BIL shoulder extension with 7# cables 3x10 Standing arm circle angels 2x5 Standing BIL scaption lifts with 4# dumbbells with chin  tuck into ball at wall 3x10 Seated lat pull-down with chin tuck with 45# 3x10 Seated combined end-range cervical rotation/ extension 2x10 BIL Seated upper trap stretch x62min BIL Seated levator scap stretch x56min BIL Manual Therapy: Prone CT junction grade V manipulation x1 BIL with cavitation Prone thoracic grade V maniplation x5 throughout thoracic spine with cavitation Neuromuscular re-ed: N/A Therapeutic Activity: N/A Modalities: N/A Self Care: N/A                                                                           [facility] Adult PT Treatment:  DATE: 01/21/2022 Therapeutic Exercise: Seated low rows with 35# 2x10 Seated high rows with 35# 2x10 Seated lat pull-downs with 35# 2x10 Seated shoulder rolls 2x10 forward and backward Standing BIL scaption lifts with 4# dumbbells with chin tuck into ball at wall 3x10 Manual Therapy: Skilled palpation to identify trigger points prior to TDN STM/ effleurage to Lt suboccipitals/ cervical paraspinals/ mid trap/ upper trap following TDN x13 min Prone CT junction grade V manipulation x1 BIL with cavitation Prone thoracic grade V maniplation x5 throughout thoracic spine with cavitation  Trigger Point Dry-Needling  Treatment instructions: Expect mild to moderate muscle soreness. S/S of pneumothorax if dry needled over a lung field, and to seek immediate medical attention should they occur. Patient verbalized understanding of these instructions and education.  Patient Consent Given: Yes Education handout provided: No Muscles treated: Lt upper trap Electrical stimulation performed: No Parameters: N/A Treatment response/outcome: Multiple twitch responses, improved muscle extensibility, improved pain from 4/10 to 0/10  Neuromuscular re-ed: N/A Therapeutic Activity: N/A Modalities: N/A Self Care: N/A    PATIENT EDUCATION:  Education details: Discussed eval findings, rehab rationale and  POC and patient is in agreement  Person educated: Patient Education method: Explanation Education comprehension: verbalized understanding and needs further education   HOME EXERCISE PROGRAM: Access Code: [email or web address] URL: [email or web address] Date: 01/12/2022 Prepared by: Gustavus Bryant   Exercises - Seated Scalenes Stretch  - 1 x daily - 5 x weekly - 1 sets - 3 reps - 30s hold - Supine Cervical Sidebending Stretch  - 1 x daily - 5 x weekly - 1 sets - 1 reps - 30s hold - Supine Anterior Scalene Stretch  - 1 x daily - 5 x weekly - 1 sets - 1 reps - 30s hold - Supine Posterior Scalene Stretch  - 1 x daily - 5 x weekly - 1 sets - 1 reps - 30s hold  Added 01/21/2022: - Standing Shoulder Row with Anchored Resistance  - 1 x daily - 7 x weekly - 3 sets - 10 reps - Seated Cervical Retraction  - 1 x daily - 7 x weekly - 2 sets - 10 reps - 5 seconds hold   ASSESSMENT:   CLINICAL IMPRESSION: Pt continues to respond well to TDN and OMT, reporting decrease in pain following these interventions. She also responded well to progressed exercises, demonstrating good form and no pain throughout the session. She will continue to benefit from skilled PT to address her primary impairments and return to her prior level of function with less limitation.   OBJECTIVE IMPAIRMENTS: decreased mobility, decreased ROM, increased fascial restrictions, increased muscle spasms, impaired flexibility, improper body mechanics, postural dysfunction, and pain.    ACTIVITY LIMITATIONS: carrying, bending, and prolonged positions   PERSONAL FACTORS: Age, Fitness, Past/current experiences, Time since onset of injury/illness/exacerbation, and 1 comorbidity: migraines  are also affecting patient's functional outcome.        GOALS: Goals reviewed with patient? No   SHORT TERM GOALS: Target date: 02/03/2022     Patient to demonstrate independence in HEP  Baseline: [id number] Goal status: INITIAL       LONG  TERM GOALS: Target date: 02/24/2022     Decrease worst pain to 4/10 Baseline: 7/10 Goal status: INITIAL   2.  Decrease L scalene group tenderness/tightness to minimal Baseline: Moderate tenderness/tightness to L scalene group Goal status: INITIAL   3.  Increase FOTO score to 71 Baseline: 48 Goal status: INITIAL         PLAN:  PT FREQUENCY: 1-2x/week   PT DURATION: 6 weeks   PLANNED INTERVENTIONS: Therapeutic exercises, Therapeutic activity, Neuromuscular re-education, Balance training, Gait training, Patient/Family education, Self Care, Joint mobilization, Joint manipulation, Dry Needling, Spinal manipulation, Spinal mobilization, Manual therapy, and Re-evaluation   PLAN FOR NEXT SESSION: HEP review and update, posture training, manual techniques, scalene stretching, TPDN, first rib mobilization   Carmelina Dane, PT, DPT 02/03/22 4:15 PM

## 2022-02-05 ENCOUNTER — Encounter: Payer: No Typology Code available for payment source | Admitting: Internal Medicine

## 2022-02-05 ENCOUNTER — Ambulatory Visit: Payer: No Typology Code available for payment source | Admitting: Internal Medicine

## 2022-02-05 ENCOUNTER — Encounter: Payer: Self-pay | Admitting: Internal Medicine

## 2022-02-05 VITALS — BP 126/82 | HR 67 | Temp 98.2°F | Resp 16 | Ht 66.0 in | Wt 246.0 lb

## 2022-02-05 DIAGNOSIS — Z0000 Encounter for general adult medical examination without abnormal findings: Secondary | ICD-10-CM | POA: Diagnosis not present

## 2022-02-05 DIAGNOSIS — Z6839 Body mass index (BMI) 39.0-39.9, adult: Secondary | ICD-10-CM | POA: Diagnosis not present

## 2022-02-05 DIAGNOSIS — Z0001 Encounter for general adult medical examination with abnormal findings: Secondary | ICD-10-CM

## 2022-02-05 DIAGNOSIS — K7581 Nonalcoholic steatohepatitis (NASH): Secondary | ICD-10-CM | POA: Diagnosis not present

## 2022-02-05 NOTE — Patient Instructions (Signed)

## 2022-02-05 NOTE — Progress Notes (Signed)
Subjective:  Patient ID: Erin Harris, female    DOB: 1990-10-02  Age: 32 y.o. MRN: [id number]  CC: Annual Exam   HPI Erin Harris presents for a CPX and f/up -  She has been working on her lifestyle modifications but has not been able to lose weight.  She otherwise feels well and offers no other complaints.  Outpatient Medications Prior to Visit  Medication Sig Dispense Refill   Atogepant (QULIPTA) 60 MG TABS Take 60 mg by mouth daily. 30 tablet 11   baclofen (LIORESAL) 10 MG tablet Take 1 tablet (10 mg total) by mouth 3 (three) times daily as needed for muscle spasms. 90 each 5   buPROPion (WELLBUTRIN XL) 300 MG 24 hr tablet Take 1 tablet (300 mg total) by mouth daily. 90 tablet 1   CRYSELLE-28 0.3-30 MG-MCG tablet TAKE 1 TABLET BY MOUTH EVERY DAY 84 tablet 1   dicyclomine (BENTYL) 10 MG capsule TAKE 1 CAPSULE (10 MG TOTAL) BY MOUTH 3 (THREE) TIMES DAILY BEFORE MEALS. (Patient taking differently: Take 10 mg by mouth 3 (three) times daily before meals. PRN) 270 capsule 0   ondansetron (ZOFRAN-ODT) 4 MG disintegrating tablet Take 1 tablet (4 mg total) by mouth every 8 (eight) hours as needed for nausea or vomiting. 20 tablet 5   propranolol ER (INDERAL LA) 60 MG 24 hr capsule TAKE 1 CAPSULE BY MOUTH EVERY DAY 90 capsule 0   Rimegepant Sulfate (NURTEC) 75 MG TBDP Take 75 mg by mouth daily as needed. 8 tablet 5   rizatriptan (MAXALT-MLT) 10 MG disintegrating tablet Take 1 tablet earliest onset of migraine.  May repeat in 2 hours if needed.  Maximum 2 tablets in 24 hours. 10 tablet 5   traZODone (DESYREL) 50 MG tablet Take 1 tablet (50 mg total) by mouth at bedtime. 90 tablet 1   venlafaxine (EFFEXOR) 75 MG tablet TAKE 1 TABLET BY MOUTH EVERY DAY 90 tablet 1   venlafaxine XR (EFFEXOR-XR) 150 MG 24 hr capsule Take 1 capsule (150 mg total) by mouth daily. (Patient taking differently: Take 150 mg by mouth daily. 2 tab to total 300 mg) 90 capsule 1   No facility-administered medications prior to  visit.    ROS Review of Systems  Constitutional: Negative.  Negative for fatigue and unexpected weight change.  HENT: Negative.    Eyes: Negative.   Respiratory:  Negative for cough and shortness of breath.   Cardiovascular:  Negative for chest pain and leg swelling.  Gastrointestinal:  Negative for abdominal pain, constipation, diarrhea, nausea and vomiting.  Endocrine: Negative.   Genitourinary: Negative.  Negative for difficulty urinating.  Musculoskeletal: Negative.  Negative for arthralgias.  Skin: Negative.   Neurological: Negative.  Negative for dizziness and weakness.  Hematological:  Negative for adenopathy. Does not bruise/bleed easily.  Psychiatric/Behavioral: Negative.      Objective:  BP 126/82 (BP Location: Right Arm, Patient Position: Sitting, Cuff Size: Large)   Pulse 67   Temp 98.2 F (36.8 C) (Oral)   Resp 16   Ht 5\' 6"  (1.676 m)   Wt 246 lb (111.6 kg)   LMP  (LMP Unknown)   SpO2 98%   BMI 39.71 kg/m   BP Readings from Last 3 Encounters:  02/05/22 126/82  12/23/21 110/77  08/08/21 115/80    Wt Readings from Last 3 Encounters:  02/05/22 246 lb (111.6 kg)  12/23/21 249 lb (112.9 kg)  08/08/21 254 lb (115.2 kg)    Physical Exam Vitals reviewed.  Constitutional:  Appearance: She is obese.  HENT:     Mouth/Throat:     Mouth: Mucous membranes are moist.  Eyes:     General: No scleral icterus.    Conjunctiva/sclera: Conjunctivae normal.  Cardiovascular:     Rate and Rhythm: Normal rate and regular rhythm.     Heart sounds: No murmur heard. Pulmonary:     Effort: Pulmonary effort is normal.     Breath sounds: No stridor. No wheezing, rhonchi or rales.  Abdominal:     General: Abdomen is flat.     Palpations: There is no mass.     Tenderness: There is no abdominal tenderness. There is no guarding or rebound.     Hernia: No hernia is present.  Musculoskeletal:        General: Normal range of motion.     Cervical back: Neck supple.      Right lower leg: No edema.     Left lower leg: No edema.  Lymphadenopathy:     Cervical: No cervical adenopathy.  Skin:    General: Skin is warm and dry.  Neurological:     General: No focal deficit present.     Mental Status: She is alert.  Psychiatric:        Mood and Affect: Mood normal.        Behavior: Behavior normal.   0 Trainor  Lab Results  Component Value Date   WBC 11.0 (H) 02/05/2022   HGB 13.4 02/05/2022   HCT 40.3 02/05/2022   PLT 306.0 02/05/2022   GLUCOSE 77 02/05/2022   CHOL 139 02/05/2022   TRIG 102.0 02/05/2022   HDL 39.80 02/05/2022   LDLCALC 79 02/05/2022   ALT 64 (H) 02/05/2022   AST 36 02/05/2022   NA 137 02/05/2022   K 4.3 02/05/2022   CL 103 02/05/2022   CREATININE 0.84 02/05/2022   BUN 11 02/05/2022   CO2 27 02/05/2022   TSH 2.06 02/05/2022   HGBA1C 5.3 02/05/2022    MR CERVICAL SPINE WO CONTRAST  Result Date: 09/11/2021 CLINICAL DATA:  Initial evaluation for chronic neck pain, cervicalgia. EXAM: MRI CERVICAL SPINE WITHOUT CONTRAST TECHNIQUE: Multiplanar, multisequence MR imaging of the cervical spine was performed. No intravenous contrast was administered. COMPARISON:  None available. FINDINGS: Alignment: Straightening with slight reversal of the normal cervical lordosis. No significant listhesis. Vertebrae: Vertebral body height maintained without acute or chronic fracture. Bone marrow signal intensity within normal limits. No discrete or worrisome osseous lesions. No abnormal marrow edema. Cord: Focal prominence of the central canal versus tiny syrinx seen at the level of C6-7 (series 5, image 31). This measures up to 2 mm in maximal diameter. Signal intensity within the visualized cord is otherwise within normal limits. Posterior Fossa, vertebral arteries, paraspinal tissues: Unremarkable. Disc levels: C2-C3: Unremarkable. C3-C4: Minimal disc bulge. No spinal stenosis. Foramina remain patent. C4-C5: Minimal disc bulge with endplate spurring. No  significant spinal stenosis. Remain patent. C5-C6: Minimal disc bulge with superimposed tiny central disc protrusion. No significant spinal stenosis. Foramina remain patent. C6-C7: Degenerative intervertebral disc space narrowing with diffuse disc osteophyte complex. Flattening and partial effacement of the ventral thecal sac with resultant mild spinal stenosis. Secondary mild cord flattening. Mild left C7 foraminal stenosis. Right neural foramen remains patent. C7-T1:  Seen only on sagittal projection.  Unremarkable. Visualized upper thoracic spine demonstrates no significant finding. IMPRESSION: 1. 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 2. Additional mild noncompressive disc bulging at  C3-4 through C5-6 without significant stenosis or neural impingement. 3. Focal prominence of the central canal versus tiny syrinx at the level of C6-7. Electronically Signed   By: Jeannine Boga M.D.   On: 09/11/2021 17:18    Assessment & Plan:   Erin Harris was seen today for annual exam.  Diagnoses and all orders for this visit:  Encounter for general adult medical examination with abnormal findings- Exam completed, labs reviewed, vaccines reviewed, cancer screenings are up-to-date, patient education was given. -     Lipid panel; Future -     Lipid panel  NASH (nonalcoholic steatohepatitis)- In addition to lifestyle modifications I think she would benefit from a GLP/GIP agonist. -     CBC with Differential/Platelet; Future -     Hepatic function panel; Future -     Hemoglobin A1c; Future -     TSH; Future -     Basic metabolic panel; Future -     hCG, quantitative, pregnancy; Future -     hCG, quantitative, pregnancy -     Basic metabolic panel -     TSH -     Hemoglobin A1c -     Hepatic function panel -     CBC with Differential/Platelet -     tirzepatide (ZEPBOUND) 2.5 MG/0.5ML Pen; Inject 2.5 mg into the skin once a week.  Class 2 severe  obesity due to excess calories with serious comorbidity and body mass index (BMI) of 39.0 to 39.9 in adult Erin Harris)- In addition to lifestyle modifications I think she would benefit from a GLP/GIP agonist. -     CBC with Differential/Platelet; Future -     Hepatic function panel; Future -     Hemoglobin A1c; Future -     TSH; Future -     Basic metabolic panel; Future -     hCG, quantitative, pregnancy; Future -     hCG, quantitative, pregnancy -     Basic metabolic panel -     TSH -     Hemoglobin A1c -     Hepatic function panel -     CBC with Differential/Platelet -     tirzepatide (ZEPBOUND) 2.5 MG/0.5ML Pen; Inject 2.5 mg into the skin once a week.   I am having Erin Harris start on Zepbound. I am also having her maintain her buPROPion, traZODone, venlafaxine XR, Cryselle-28, rizatriptan, ondansetron, venlafaxine, dicyclomine, Nurtec, propranolol ER, Qulipta, and baclofen.  Meds ordered this encounter  Medications   tirzepatide (ZEPBOUND) 2.5 MG/0.5ML Pen    Sig: Inject 2.5 mg into the skin once a week.    Dispense:  4 mL    Refill:  0     Follow-up: Return in about 6 months (around 08/06/2022).  Erin Calico, MD

## 2022-02-06 ENCOUNTER — Other Ambulatory Visit: Payer: Self-pay | Admitting: Internal Medicine

## 2022-02-06 ENCOUNTER — Encounter: Payer: Self-pay | Admitting: Internal Medicine

## 2022-02-06 DIAGNOSIS — K7581 Nonalcoholic steatohepatitis (NASH): Secondary | ICD-10-CM

## 2022-02-06 LAB — LIPID PANEL
Cholesterol: 139 mg/dL (ref 0–200)
HDL: 39.8 mg/dL (ref >39.00)
LDL Cholesterol: 79 mg/dL (ref 0–99)
NonHDL: 99.62
Total CHOL/HDL Ratio: 4
Triglycerides: 102 mg/dL (ref 0.0–149.0)
VLDL: 20.4 mg/dL (ref 0.0–40.0)

## 2022-02-06 LAB — HEPATIC FUNCTION PANEL
ALT: 64 U/L — ABNORMAL HIGH (ref 0–35)
AST: 36 U/L (ref 0–37)
Albumin: 4.3 g/dL (ref 3.5–5.2)
Alkaline Phosphatase: 50 U/L (ref 39–117)
Bilirubin, Direct: 0.1 mg/dL (ref 0.0–0.3)
Total Bilirubin: 0.5 mg/dL (ref 0.2–1.2)
Total Protein: 7.3 g/dL (ref 6.0–8.3)

## 2022-02-06 LAB — HCG, QUANTITATIVE, PREGNANCY: Quantitative HCG: <0.6 m[IU]/mL

## 2022-02-06 LAB — CBC WITH DIFFERENTIAL/PLATELET
Basophils Absolute: 0.1 10*3/uL (ref 0.0–0.1)
Basophils Relative: 0.6 % (ref 0.0–3.0)
Eosinophils Absolute: 0.2 10*3/uL (ref 0.0–0.7)
Eosinophils Relative: 2 % (ref 0.0–5.0)
HCT: 40.3 % (ref 36.0–46.0)
Hemoglobin: 13.4 g/dL (ref 12.0–15.0)
Lymphocytes Relative: 31.4 % (ref 12.0–46.0)
Lymphs Abs: 3.5 10*3/uL (ref 0.7–4.0)
MCHC: 33.2 g/dL (ref 30.0–36.0)
MCV: 96.7 fl (ref 78.0–100.0)
Monocytes Absolute: 1 10*3/uL (ref 0.1–1.0)
Monocytes Relative: 9 % (ref 3.0–12.0)
Neutro Abs: 6.3 10*3/uL (ref 1.4–7.7)
Neutrophils Relative %: 57 % (ref 43.0–77.0)
Platelets: 306 10*3/uL (ref 150.0–400.0)
RBC: 4.17 Mil/uL (ref 3.87–5.11)
RDW: 12.5 % (ref 11.5–15.5)
WBC: 11 10*3/uL — ABNORMAL HIGH (ref 4.0–10.5)

## 2022-02-06 LAB — BASIC METABOLIC PANEL
BUN: 11 mg/dL (ref 6–23)
CO2: 27 mEq/L (ref 19–32)
Calcium: 9.2 mg/dL (ref 8.4–10.5)
Chloride: 103 mEq/L (ref 96–112)
Creatinine, Ser: 0.84 mg/dL (ref 0.40–1.20)
GFR: 91.14 mL/min (ref >60.00)
Glucose, Bld: 77 mg/dL (ref 70–99)
Potassium: 4.3 mEq/L (ref 3.5–5.1)
Sodium: 137 mEq/L (ref 135–145)

## 2022-02-06 LAB — HEMOGLOBIN A1C: Hgb A1c MFr Bld: 5.3 % (ref 4.6–6.5)

## 2022-02-06 LAB — TSH: TSH: 2.06 u[IU]/mL (ref 0.35–5.50)

## 2022-02-06 MED ORDER — ZEPBOUND 2.5 MG/0.5ML ~~LOC~~ SOAJ: 2.5000 mg | SUBCUTANEOUS | 0 refills | Status: DC

## 2022-02-10 ENCOUNTER — Ambulatory Visit: Payer: No Typology Code available for payment source

## 2022-02-10 DIAGNOSIS — Z8669 Personal history of other diseases of the nervous system and sense organs: Secondary | ICD-10-CM

## 2022-02-10 DIAGNOSIS — M542 Cervicalgia: Secondary | ICD-10-CM

## 2022-02-10 NOTE — Therapy (Signed)
OUTPATIENT PHYSICAL THERAPY TREATMENT NOTE   Patient Name: Erin Harris MRN: [id number] DOB:1990/03/18, 32 y.o., female Today's Date: 02/10/2022  PCP: Janith Lima, MD  REFERRING PROVIDER: Pieter Partridge, DO   END OF SESSION:   PT End of Session - 02/10/22 1535     Visit Number 5    Number of Visits 12    Date for PT Re-Evaluation 03/09/22    Authorization Type [employer]    PT Start Time 1535    PT Stop Time 7026    PT Time Calculation (min) 42 min    Activity Tolerance Patient tolerated treatment well    Behavior During Therapy WFL for tasks assessed/performed                Past Medical History:  Diagnosis Date   Allergy    Depression    Frequent headaches    IBS (irritable bowel syndrome)    Migraine    PTSD (post-traumatic stress disorder)    UTI (urinary tract infection)    Past Surgical History:  Procedure Laterality Date   ARTHROSCOPIC REPAIR ACL     CHOLECYSTECTOMY     WISDOM TOOTH EXTRACTION     Patient Active Problem List   Diagnosis Date Noted   Class 2 severe obesity due to excess calories with serious comorbidity and body mass index (BMI) of 39.0 to 39.9 in adult ([place]) 02/05/2022   Moderate persistent asthma with acute exacerbation 05/28/2020   NASH (nonalcoholic steatohepatitis) 02/21/2020   Tachycardia 02/19/2020   Migraine without aura and without status migrainosus, not intractable 02/19/2020   Encounter for general adult medical examination with abnormal findings 02/19/2020   Elevated prolactin level 02/19/2020    REFERRING DIAG: M54.2 (ICD-10-CM) - Cervicalgia   THERAPY DIAG:  Cervicalgia  History of migraine headaches  Rationale for Evaluation and Treatment Rehabilitation  PERTINENT HISTORY:  Migraine without aura, without status migrainosus, intractable, improved Cervicogenic headache, improved Cervicalgia, likely myofascial   SUBJECTIVE:                                                                                                                                                                                       SUBJECTIVE STATEMENT:  Pt presents to PT with continued reports of pain. Has been compliant with HEP. Ready to begin PT at this time.    PAIN:  Are you having pain? Yes: NPRS scale: 2-3/10 Pain location: cervical Pain description: ache Aggravating factors: prolonged positioning Relieving factors: Thera-gun, moist heat   OBJECTIVE: (objective measures completed at initial evaluation unless otherwise dated)   DIAGNOSTIC FINDINGS:  CLINICAL DATA:  Initial evaluation for chronic neck pain, cervicalgia.  EXAM: MRI CERVICAL SPINE WITHOUT CONTRAST   TECHNIQUE: Multiplanar, multisequence MR imaging of the cervical spine was performed. No intravenous contrast was administered.   COMPARISON:  None available.   FINDINGS: Alignment: Straightening with slight reversal of the normal cervical lordosis. No significant listhesis.   Vertebrae: Vertebral body height maintained without acute or chronic fracture. Bone marrow signal intensity within normal limits. No discrete or worrisome osseous lesions. No abnormal marrow edema.   Cord: Focal prominence of the central canal versus tiny syrinx seen at the level of C6-7 (series 5, image 31). This measures up to 2 mm in maximal diameter. Signal intensity within the visualized cord is otherwise within normal limits.   Posterior Fossa, vertebral arteries, paraspinal tissues: Unremarkable.   Disc levels:   C2-C3: Unremarkable.   C3-C4: Minimal disc bulge. No spinal stenosis. Foramina remain patent.   C4-C5: Minimal disc bulge with endplate spurring. No significant spinal stenosis.   Remain patent.   C5-C6: Minimal disc bulge with superimposed tiny central disc protrusion. No significant spinal stenosis. Foramina remain patent.   C6-C7: Degenerative intervertebral disc space narrowing with diffuse disc osteophyte complex. Flattening and  partial effacement of the ventral thecal sac with resultant mild spinal stenosis. Secondary mild cord flattening. Mild left C7 foraminal stenosis. Right neural foramen remains patent.   C7-T1:  Seen only on sagittal projection.  Unremarkable.   Visualized upper thoracic spine demonstrates no significant finding.   IMPRESSION: 1. 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 2. Additional mild noncompressive disc bulging at C3-4 through C5-6 without significant stenosis or neural impingement. 3. Focal prominence of the central canal versus tiny syrinx at the level of C6-7.     Electronically Signed   By: Rise Mu M.D.   On: 09/11/2021 17:18   PATIENT SURVEYS:  FOTO 48(71 predicted)   COGNITION: Overall cognitive status: Within functional limits for tasks assessed   SENSATION: Not tested   POSTURE: rounded shoulders   PALPATION: TTP L scalenes           CERVICAL ROM:    Active ROM A/PROM (deg) eval  Flexion 90%  Extension 90%  Right lateral flexion 90%  Left lateral flexion 90%  Right rotation 90%  Left rotation 90%   (Blank rows = not tested)   UPPER EXTREMITY ROM: WNL throughout   Active ROM Right eval Left eval  Shoulder flexion      Shoulder extension      Shoulder abduction      Shoulder adduction      Shoulder extension      Shoulder internal rotation      Shoulder external rotation      Elbow flexion      Elbow extension      Wrist flexion      Wrist extension      Wrist ulnar deviation      Wrist radial deviation      Wrist pronation      Wrist supination       (Blank rows = not tested)   UPPER EXTREMITY MMT: WNL throughout   MMT Right eval Left eval  Shoulder flexion      Shoulder extension      Shoulder abduction      Shoulder adduction      Shoulder extension      Shoulder internal rotation      Shoulder external rotation      Middle trapezius  Lower trapezius       Elbow flexion      Elbow extension      Wrist flexion      Wrist extension      Wrist ulnar deviation      Wrist radial deviation      Wrist pronation      Wrist supination      Grip strength       (Blank rows = not tested)   CERVICAL SPECIAL TESTS:  Neck flexor muscle endurance test: Negative and Distraction test: Negative   FUNCTIONAL TESTS:  N/A   TODAY'S TREATMENT:        [facility] Adult PT Treatment:                                                DATE: 02/10/2022 Therapeutic Exercise: UBE lvl 1.0 x 3 min while taking subjective Standing row 2x10 20# Bilateral ER GTB 3x10 Seated horizontal abd 2x15 GTB Prone Y 2x10 Seated low row 2x10 25# Seated lat pulldown 2x10 25# Manual Therapy: Skilled palpation to identify trigger points prior to [facility] Trigger Point Dry Needling Treatment: Pre-treatment instruction: Patient instructed on dry needling rationale, procedures, and possible side effects including pain during treatment (achy,cramping feeling), bruising, drop of blood, lightheadedness, nausea, sweating. Patient Consent Given: No Education handout provided: No Muscles treated: upper traps  Needle size and number: .30x36mm x 3 Electrical stimulation performed: No Parameters: N/A Treatment response/outcome: Twitch response elicited and Palpable decrease in muscle tension Post-treatment instructions: Patient instructed to expect possible mild to moderate muscle soreness later today and/or tomorrow. Patient instructed in methods to reduce muscle soreness and to continue prescribed HEP. If patient was dry needled over the lung field, patient was instructed on signs and symptoms of pneumothorax and, however unlikely, to see immediate medical attention should they occur. Patient was also educated on signs and symptoms of infection and to seek medical attention should they occur. Patient verbalized understanding of these instructions and education.   [facility] Adult PT Treatment:                                                 DATE: 02/03/2022 Therapeutic Exercise: Standing low rows with two 10# cables 3x10 Standing shoulder rolls 2x10 forward and backward Standing mini-lunge push-pull with two 10# cables 2x10 BIL Standing lat pull-downs with two 20# cables 3x10 Manual Therapy: Skilled palpation to identify trigger points prior to TDN STM/ effleurage to Lt suboccipitals/ cervical paraspinals/ mid trap/ upper trap following TDN x13 min Prone CT junction grade V manipulation x1 BIL with cavitation Prone thoracic grade V maniplation x5 throughout thoracic spine with cavitation  Trigger Point Dry-Needling  Treatment instructions: Expect mild to moderate muscle soreness. S/S of pneumothorax if dry needled over a lung field, and to seek immediate medical attention should they occur. Patient verbalized understanding of these instructions and education.  Patient Consent Given: Yes Education handout provided: No Muscles treated: Lt upper trap Electrical stimulation performed: No Parameters: N/A Treatment response/outcome: Multiple twitch responses, improved muscle extensibility, improved pain from 2-3/10 to 1/10 Neuromuscular re-ed: N/A Therapeutic Activity: N/A Modalities: N/A Self Care: N/A   [facility] Adult PT Treatment:  DATE: 01/27/2022 Therapeutic Exercise: Seated serratus punches with 45# 3x15 Standing BIL shoulder extension with 7# cables 3x10 Standing arm circle angels 2x5 Standing BIL scaption lifts with 4# dumbbells with chin tuck into ball at wall 3x10 Seated lat pull-down with chin tuck with 45# 3x10 Seated combined end-range cervical rotation/ extension 2x10 BIL Seated upper trap stretch x80min BIL Seated levator scap stretch x39min BIL Manual Therapy: Prone CT junction grade V manipulation x1 BIL with cavitation Prone thoracic grade V maniplation x5 throughout thoracic spine with cavitation Neuromuscular  re-ed: N/A Therapeutic Activity: N/A Modalities: N/A Self Care: N/A    PATIENT EDUCATION:  Education details: Discussed eval findings, rehab rationale and POC and patient is in agreement  Person educated: Patient Education method: Explanation Education comprehension: verbalized understanding and needs further education   HOME EXERCISE PROGRAM: Access Code: [email or web address] URL: [email or web address] Date: 02/10/2022 Prepared by: Edwinna Areola  Exercises - Seated Scalenes Stretch  - 1 x daily - 5 x weekly - 1 sets - 3 reps - 30s hold - Supine Cervical Sidebending Stretch  - 1 x daily - 5 x weekly - 1 sets - 1 reps - 30s hold - Supine Anterior Scalene Stretch  - 1 x daily - 5 x weekly - 1 sets - 1 reps - 30s hold - Supine Posterior Scalene Stretch  - 1 x daily - 5 x weekly - 1 sets - 1 reps - 30s hold - Standing Shoulder Row with Anchored Resistance  - 1 x daily - 7 x weekly - 3 sets - 10 reps - Seated Cervical Retraction  - 1 x daily - 7 x weekly - 2 sets - 10 reps - 5 seconds hold - Prone Scapular Retraction Y  - 1 x daily - 7 x weekly - 2 sets - 10 reps   ASSESSMENT:   CLINICAL IMPRESSION: Pt was able to complete all prescribed exercises with no adverse effect. Therapy focused on improving periscapular strength to decrease neck and upper trap pain and reduce compensatory movements. Pt once again responded well to TPDN. HEP updated for middle and lower trap strengthening. Will continue to progress as able per POC.    OBJECTIVE IMPAIRMENTS: decreased mobility, decreased ROM, increased fascial restrictions, increased muscle spasms, impaired flexibility, improper body mechanics, postural dysfunction, and pain.    ACTIVITY LIMITATIONS: carrying, bending, and prolonged positions   PERSONAL FACTORS: Age, Fitness, Past/current experiences, Time since onset of injury/illness/exacerbation, and 1 comorbidity: migraines  are also affecting patient's functional outcome.         GOALS: Goals reviewed with patient? No   SHORT TERM GOALS: Target date: 02/03/2022     Patient to demonstrate independence in HEP  Baseline: [id number] Goal status: INITIAL       LONG TERM GOALS: Target date: 02/24/2022     Decrease worst pain to 4/10 Baseline: 7/10 Goal status: INITIAL   2.  Decrease L scalene group tenderness/tightness to minimal Baseline: Moderate tenderness/tightness to L scalene group Goal status: INITIAL   3.  Increase FOTO score to 71 Baseline: 48 Goal status: INITIAL         PLAN:   PT FREQUENCY: 1-2x/week   PT DURATION: 6 weeks   PLANNED INTERVENTIONS: Therapeutic exercises, Therapeutic activity, Neuromuscular re-education, Balance training, Gait training, Patient/Family education, Self Care, Joint mobilization, Joint manipulation, Dry Needling, Spinal manipulation, Spinal mobilization, Manual therapy, and Re-evaluation   PLAN FOR NEXT SESSION: HEP review and update, posture training, manual techniques, scalene stretching, TPDN, first rib mobilization  Eloy End PT  02/10/22 4:34 PM

## 2022-02-13 ENCOUNTER — Other Ambulatory Visit: Payer: Self-pay | Admitting: Internal Medicine

## 2022-02-13 MED ORDER — TIRZEPATIDE 2.5 MG/0.5ML ~~LOC~~ SOAJ: 2.5000 mg | SUBCUTANEOUS | 0 refills | Status: DC

## 2022-02-19 ENCOUNTER — Ambulatory Visit: Payer: No Typology Code available for payment source

## 2022-02-19 DIAGNOSIS — M542 Cervicalgia: Secondary | ICD-10-CM | POA: Diagnosis not present

## 2022-02-19 DIAGNOSIS — Z8669 Personal history of other diseases of the nervous system and sense organs: Secondary | ICD-10-CM

## 2022-02-19 NOTE — Therapy (Signed)
OUTPATIENT PHYSICAL THERAPY TREATMENT NOTE   Patient Name: Erin Harris MRN: [id number] DOB:1990/11/04, 32 y.o., female Today's Date: 02/19/2022  PCP: Etta Grandchild, MD  REFERRING PROVIDER: Drema Dallas, DO   END OF SESSION:   PT End of Session - 02/19/22 1407     Visit Number 6    Number of Visits 12    Date for PT Re-Evaluation 03/09/22    Authorization Type [employer]    PT Start Time 1406    PT Stop Time 1444    PT Time Calculation (min) 38 min    Activity Tolerance Patient tolerated treatment well    Behavior During Therapy WFL for tasks assessed/performed                 Past Medical History:  Diagnosis Date   Allergy    Depression    Frequent headaches    IBS (irritable bowel syndrome)    Migraine    PTSD (post-traumatic stress disorder)    UTI (urinary tract infection)    Past Surgical History:  Procedure Laterality Date   ARTHROSCOPIC REPAIR ACL     CHOLECYSTECTOMY     WISDOM TOOTH EXTRACTION     Patient Active Problem List   Diagnosis Date Noted   Class 2 severe obesity due to excess calories with serious comorbidity and body mass index (BMI) of 39.0 to 39.9 in adult (HCC) 02/05/2022   Moderate persistent asthma with acute exacerbation 05/28/2020   NASH (nonalcoholic steatohepatitis) 02/21/2020   Tachycardia 02/19/2020   Migraine without aura and without status migrainosus, not intractable 02/19/2020   Encounter for general adult medical examination with abnormal findings 02/19/2020   Elevated prolactin level 02/19/2020    REFERRING DIAG: M54.2 (ICD-10-CM) - Cervicalgia   THERAPY DIAG:  Cervicalgia  History of migraine headaches  Rationale for Evaluation and Treatment Rehabilitation  PERTINENT HISTORY:  Migraine without aura, without status migrainosus, intractable, improved Cervicogenic headache, improved Cervicalgia, likely myofascial   SUBJECTIVE:                                                                                                                                                                                       SUBJECTIVE STATEMENT:  Pt reports she has had increased posterior shoulder pain the past few days, which she reports may be due to UBE and arm lift exercises at last session.   She reports HEP adherence.    PAIN:  Are you having pain? Yes: NPRS scale: 2/10 Pain location: cervical Pain description: ache Aggravating factors: prolonged positioning Relieving factors: Thera-gun, moist heat   OBJECTIVE: (objective measures completed at initial evaluation unless otherwise dated)  DIAGNOSTIC FINDINGS:  CLINICAL DATA:  Initial evaluation for chronic neck pain, cervicalgia.   EXAM: MRI CERVICAL SPINE WITHOUT CONTRAST   TECHNIQUE: Multiplanar, multisequence MR imaging of the cervical spine was performed. No intravenous contrast was administered.   COMPARISON:  None available.   FINDINGS: Alignment: Straightening with slight reversal of the normal cervical lordosis. No significant listhesis.   Vertebrae: Vertebral body height maintained without acute or chronic fracture. Bone marrow signal intensity within normal limits. No discrete or worrisome osseous lesions. No abnormal marrow edema.   Cord: Focal prominence of the central canal versus tiny syrinx seen at the level of C6-7 (series 5, image 31). This measures up to 2 mm in maximal diameter. Signal intensity within the visualized cord is otherwise within normal limits.   Posterior Fossa, vertebral arteries, paraspinal tissues: Unremarkable.   Disc levels:   C2-C3: Unremarkable.   C3-C4: Minimal disc bulge. No spinal stenosis. Foramina remain patent.   C4-C5: Minimal disc bulge with endplate spurring. No significant spinal stenosis.   Remain patent.   C5-C6: Minimal disc bulge with superimposed tiny central disc protrusion. No significant spinal stenosis. Foramina remain patent.   C6-C7: Degenerative intervertebral disc  space narrowing with diffuse disc osteophyte complex. Flattening and partial effacement of the ventral thecal sac with resultant mild spinal stenosis. Secondary mild cord flattening. Mild left C7 foraminal stenosis. Right neural foramen remains patent.   C7-T1:  Seen only on sagittal projection.  Unremarkable.   Visualized upper thoracic spine demonstrates no significant finding.   IMPRESSION: 1. 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 2. Additional mild noncompressive disc bulging at C3-4 through C5-6 without significant stenosis or neural impingement. 3. Focal prominence of the central canal versus tiny syrinx at the level of C6-7.     Electronically Signed   By: Jeannine Boga M.D.   On: 09/11/2021 17:18   PATIENT SURVEYS:  FOTO 48(71 predicted) 02/19/2022: 68%   COGNITION: Overall cognitive status: Within functional limits for tasks assessed   SENSATION: Not tested   POSTURE: rounded shoulders   PALPATION: TTP L scalenes           CERVICAL ROM:    Active ROM A/PROM (deg) eval  Flexion 90%  Extension 90%  Right lateral flexion 90%  Left lateral flexion 90%  Right rotation 90%  Left rotation 90%   (Blank rows = not tested)   UPPER EXTREMITY ROM: WNL throughout   Active ROM Right 02/19/2022 Left 02/19/2022  Shoulder flexion  WNL WNL  Shoulder extension WNL WNL  Shoulder abduction      Shoulder adduction      Shoulder extension      Shoulder internal rotation      Shoulder external rotation      Elbow flexion      Elbow extension      Wrist flexion      Wrist extension      Wrist ulnar deviation      Wrist radial deviation      Wrist pronation      Wrist supination       (Blank rows = not tested)   UPPER EXTREMITY MMT: WNL throughout   MMT Right 02/19/2022 Left 02/19/2022  Shoulder flexion  5/5   5/5  Shoulder extension  5/5    5/5  Shoulder abduction  5/5   5/5   Shoulder adduction       Shoulder extension  Shoulder internal rotation  5/5   5/5   Shoulder external rotation  5/5   5/5   Middle trapezius  4/5 4/5  Lower trapezius 3+/5 3+/5  Latissimus dorsi 5/5 5/5  Elbow flexion      Elbow extension      Wrist flexion      Wrist extension      Wrist ulnar deviation      Wrist radial deviation      Wrist pronation      Wrist supination      Grip strength       (Blank rows = not tested)   CERVICAL SPECIAL TESTS:  Neck flexor muscle endurance test: Negative and Distraction test: Negative   FUNCTIONAL TESTS:  02/19/2022: DNF endurance test: 38 seconds   TODAY'S TREATMENT:        [facility] Adult PT Treatment:                                                DATE: 02/19/2022 Therapeutic Exercise: Standing BIL scaption with 4# dumbbells and chin tuck hold to ball at wall 3x10 Stanidng mini-lunge push-pull with 10# cables 2x10 BIL Manual Therapy: Supine manual cervical distraction x3 minutes Supine STM to BIL cervical paraspinals/ suboccipitals/ UT x8 minutes Prone CT junction grade V manipulation x1 BIL with cavitation Prone grade V thoracic manipulation x4 with cavitation Neuromuscular re-ed: N/A Therapeutic Activity: Re-administration of FOTO with pt education Re-assessment of objective measures with pt education Modalities: N/A Self Care: N/A   [facility] Adult PT Treatment:                                                DATE: 02/10/2022 Therapeutic Exercise: UBE lvl 1.0 x 3 min while taking subjective Standing row 2x10 20# Bilateral ER GTB 3x10 Seated horizontal abd 2x15 GTB Prone Y 2x10 Seated low row 2x10 25# Seated lat pulldown 2x10 25# Manual Therapy: Skilled palpation to identify trigger points prior to [facility] Trigger Point Dry Needling Treatment: Pre-treatment instruction: Patient instructed on dry needling rationale, procedures, and possible side effects including pain during treatment (achy,cramping feeling), bruising, drop of blood,  lightheadedness, nausea, sweating. Patient Consent Given: No Education handout provided: No Muscles treated: upper traps  Needle size and number: .30x32mm x 3 Electrical stimulation performed: No Parameters: N/A Treatment response/outcome: Twitch response elicited and Palpable decrease in muscle tension Post-treatment instructions: Patient instructed to expect possible mild to moderate muscle soreness later today and/or tomorrow. Patient instructed in methods to reduce muscle soreness and to continue prescribed HEP. If patient was dry needled over the lung field, patient was instructed on signs and symptoms of pneumothorax and, however unlikely, to see immediate medical attention should they occur. Patient was also educated on signs and symptoms of infection and to seek medical attention should they occur. Patient verbalized understanding of these instructions and education.   [facility] Adult PT Treatment:                                                DATE: 02/03/2022 Therapeutic Exercise: Standing low rows with two  10# cables 3x10 Standing shoulder rolls 2x10 forward and backward Standing mini-lunge push-pull with two 10# cables 2x10 BIL Standing lat pull-downs with two 20# cables 3x10 Manual Therapy: Skilled palpation to identify trigger points prior to TDN STM/ effleurage to Lt suboccipitals/ cervical paraspinals/ mid trap/ upper trap following TDN x13 min Prone CT junction grade V manipulation x1 BIL with cavitation Prone thoracic grade V maniplation x5 throughout thoracic spine with cavitation  Trigger Point Dry-Needling  Treatment instructions: Expect mild to moderate muscle soreness. S/S of pneumothorax if dry needled over a lung field, and to seek immediate medical attention should they occur. Patient verbalized understanding of these instructions and education.  Patient Consent Given: Yes Education handout provided: No Muscles treated: Lt upper trap Electrical stimulation performed:  No Parameters: N/A Treatment response/outcome: Multiple twitch responses, improved muscle extensibility, improved pain from 2-3/10 to 1/10 Neuromuscular re-ed: N/A Therapeutic Activity: N/A Modalities: N/A Self Care: N/A     PATIENT EDUCATION:  Education details: Discussed eval findings, rehab rationale and POC and patient is in agreement  Person educated: Patient Education method: Explanation Education comprehension: verbalized understanding and needs further education   HOME EXERCISE PROGRAM: Access Code: [email or web address] URL: [email or web address] Date: 02/10/2022 Prepared by: Octavio Manns  Exercises - Seated Scalenes Stretch  - 1 x daily - 5 x weekly - 1 sets - 3 reps - 30s hold - Supine Cervical Sidebending Stretch  - 1 x daily - 5 x weekly - 1 sets - 1 reps - 30s hold - Supine Anterior Scalene Stretch  - 1 x daily - 5 x weekly - 1 sets - 1 reps - 30s hold - Supine Posterior Scalene Stretch  - 1 x daily - 5 x weekly - 1 sets - 1 reps - 30s hold - Standing Shoulder Row with Anchored Resistance  - 1 x daily - 7 x weekly - 3 sets - 10 reps - Seated Cervical Retraction  - 1 x daily - 7 x weekly - 2 sets - 10 reps - 5 seconds hold - Prone Scapular Retraction Y  - 1 x daily - 7 x weekly - 2 sets - 10 reps   ASSESSMENT:   CLINICAL IMPRESSION: Pt continues to progress well with PT, demonstrating improved pain levels, strength, and FOTO score. She tolerated progressed exercises and manual techniques well today and reports a decrease in pain at the end of the session. She will continue to benefit from skilled PT to address her primary impairments and return to her prior level of function with less limitation.   OBJECTIVE IMPAIRMENTS: decreased mobility, decreased ROM, increased fascial restrictions, increased muscle spasms, impaired flexibility, improper body mechanics, postural dysfunction, and pain.    ACTIVITY LIMITATIONS: carrying, bending, and prolonged positions    PERSONAL FACTORS: Age, Fitness, Past/current experiences, Time since onset of injury/illness/exacerbation, and 1 comorbidity: migraines  are also affecting patient's functional outcome.        GOALS: Goals reviewed with patient? No   SHORT TERM GOALS: Target date: 02/03/2022     Patient to demonstrate independence in HEP  Baseline: [id number] 02/19/2022: Pt reports HEP adherence Goal status: ACHIEVED       LONG TERM GOALS: Target date: 02/24/2022     Decrease worst pain to 4/10 Baseline: 7/10 02/19/2022: 5-6/10 worst pain Goal status: IN PROGRESS   2.  Decrease L scalene group tenderness/tightness to minimal Baseline: Moderate tenderness/tightness to L scalene group Goal status: INITIAL   3.  Increase FOTO score to 71 Baseline: 48  02/19/2022: 68% Goal status: IN PROGRESS         PLAN:   PT FREQUENCY: 1-2x/week   PT DURATION: 6 weeks   PLANNED INTERVENTIONS: Therapeutic exercises, Therapeutic activity, Neuromuscular re-education, Balance training, Gait training, Patient/Family education, Self Care, Joint mobilization, Joint manipulation, Dry Needling, Spinal manipulation, Spinal mobilization, Manual therapy, and Re-evaluation   PLAN FOR NEXT SESSION: HEP review and update, posture training, manual techniques, scalene stretching, TPDN, first rib mobilization   Vanessa [place], PT, DPT 02/19/22 2:45 PM

## 2022-02-24 ENCOUNTER — Ambulatory Visit: Payer: No Typology Code available for payment source

## 2022-02-24 DIAGNOSIS — M542 Cervicalgia: Secondary | ICD-10-CM

## 2022-02-24 DIAGNOSIS — Z8669 Personal history of other diseases of the nervous system and sense organs: Secondary | ICD-10-CM

## 2022-02-24 NOTE — Therapy (Addendum)
OUTPATIENT PHYSICAL THERAPY TREATMENT NOTE/DISCHARGE  PHYSICAL THERAPY DISCHARGE SUMMARY  Visits from Start of Care: 7  Current functional level related to goals / functional outcomes: See goals/objective   Remaining deficits: Unable to assess   Education / Equipment: HEP   Patient agrees to discharge. Patient goals were unable to assess. Patient is being discharged due to not returning since the last visit.    Patient Name: Erin Harris MRN: [id number] DOB:1990-12-20, 32 y.o., female Today's Date: 02/24/2022  PCP: Etta Grandchild, MD  REFERRING PROVIDER: Drema Dallas, DO   END OF SESSION:   PT End of Session - 02/24/22 1547     Visit Number 7    Number of Visits 12    Date for PT Re-Evaluation 03/09/22    Authorization Type [employer]    PT Start Time 1547   Pt arrived 16 minutes late to her appointment.   PT Stop Time 1612    PT Time Calculation (min) 25 min    Activity Tolerance Patient tolerated treatment well    Behavior During Therapy WFL for tasks assessed/performed                  Past Medical History:  Diagnosis Date   Allergy    Depression    Frequent headaches    IBS (irritable bowel syndrome)    Migraine    PTSD (post-traumatic stress disorder)    UTI (urinary tract infection)    Past Surgical History:  Procedure Laterality Date   ARTHROSCOPIC REPAIR ACL     CHOLECYSTECTOMY     WISDOM TOOTH EXTRACTION     Patient Active Problem List   Diagnosis Date Noted   Class 2 severe obesity due to excess calories with serious comorbidity and body mass index (BMI) of 39.0 to 39.9 in adult ([facility]CC) 02/05/2022   Moderate persistent asthma with acute exacerbation 05/28/2020   NASH (nonalcoholic steatohepatitis) 02/21/2020   Tachycardia 02/19/2020   Migraine without aura and without status migrainosus, not intractable 02/19/2020   Encounter for general adult medical examination with abnormal findings 02/19/2020   Elevated prolactin level 02/19/2020     REFERRING DIAG: M54.2 (ICD-10-CM) - Cervicalgia   THERAPY DIAG:  Cervicalgia  History of migraine headaches  Rationale for Evaluation and Treatment Rehabilitation  PERTINENT HISTORY:  Migraine without aura, without status migrainosus, intractable, improved Cervicogenic headache, improved Cervicalgia, likely myofascial   SUBJECTIVE:                                                                                                                                                                                      SUBJECTIVE STATEMENT:  Pt reports slightly increased  neck pain over the past couple of days, 4/10 at the worst. This is usually at the end of the work day. She also reports HEP adherence.    PAIN:  Are you having pain? Yes: NPRS scale: 2/10 Pain location: cervical Pain description: ache Aggravating factors: prolonged positioning Relieving factors: Thera-gun, moist heat   OBJECTIVE: (objective measures completed at initial evaluation unless otherwise dated)   DIAGNOSTIC FINDINGS:  CLINICAL DATA:  Initial evaluation for chronic neck pain, cervicalgia.   EXAM: MRI CERVICAL SPINE WITHOUT CONTRAST   TECHNIQUE: Multiplanar, multisequence MR imaging of the cervical spine was performed. No intravenous contrast was administered.   COMPARISON:  None available.   FINDINGS: Alignment: Straightening with slight reversal of the normal cervical lordosis. No significant listhesis.   Vertebrae: Vertebral body height maintained without acute or chronic fracture. Bone marrow signal intensity within normal limits. No discrete or worrisome osseous lesions. No abnormal marrow edema.   Cord: Focal prominence of the central canal versus tiny syrinx seen at the level of C6-7 (series 5, image 31). This measures up to 2 mm in maximal diameter. Signal intensity within the visualized cord is otherwise within normal limits.   Posterior Fossa, vertebral arteries, paraspinal  tissues: Unremarkable.   Disc levels:   C2-C3: Unremarkable.   C3-C4: Minimal disc bulge. No spinal stenosis. Foramina remain patent.   C4-C5: Minimal disc bulge with endplate spurring. No significant spinal stenosis.   Remain patent.   C5-C6: Minimal disc bulge with superimposed tiny central disc protrusion. No significant spinal stenosis. Foramina remain patent.   C6-C7: Degenerative intervertebral disc space narrowing with diffuse disc osteophyte complex. Flattening and partial effacement of the ventral thecal sac with resultant mild spinal stenosis. Secondary mild cord flattening. Mild left C7 foraminal stenosis. Right neural foramen remains patent.   C7-T1:  Seen only on sagittal projection.  Unremarkable.   Visualized upper thoracic spine demonstrates no significant finding.   IMPRESSION: 1. 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 2. Additional mild noncompressive disc bulging at C3-4 through C5-6 without significant stenosis or neural impingement. 3. Focal prominence of the central canal versus tiny syrinx at the level of C6-7.     Electronically Signed   By: Rise Mu M.D.   On: 09/11/2021 17:18   PATIENT SURVEYS:  FOTO 48(71 predicted) 02/19/2022: 68%   COGNITION: Overall cognitive status: Within functional limits for tasks assessed   SENSATION: Not tested   POSTURE: rounded shoulders   PALPATION: TTP L scalenes           CERVICAL ROM:    Active ROM A/PROM (deg) eval  Flexion 90%  Extension 90%  Right lateral flexion 90%  Left lateral flexion 90%  Right rotation 90%  Left rotation 90%   (Blank rows = not tested)   UPPER EXTREMITY ROM: WNL throughout   Active ROM Right 02/19/2022 Left 02/19/2022  Shoulder flexion  WNL WNL  Shoulder extension WNL WNL  Shoulder abduction      Shoulder adduction      Shoulder extension      Shoulder internal rotation      Shoulder  external rotation      Elbow flexion      Elbow extension      Wrist flexion      Wrist extension      Wrist ulnar deviation      Wrist radial deviation      Wrist pronation  Wrist supination       (Blank rows = not tested)   UPPER EXTREMITY MMT: WNL throughout   MMT Right 02/19/2022 Left 02/19/2022  Shoulder flexion  5/5   5/5  Shoulder extension  5/5    5/5  Shoulder abduction  5/5   5/5   Shoulder adduction      Shoulder extension      Shoulder internal rotation  5/5   5/5   Shoulder external rotation  5/5   5/5   Middle trapezius  4/5 4/5  Lower trapezius 3+/5 3+/5  Latissimus dorsi 5/5 5/5  Elbow flexion      Elbow extension      Wrist flexion      Wrist extension      Wrist ulnar deviation      Wrist radial deviation      Wrist pronation      Wrist supination      Grip strength       (Blank rows = not tested)   CERVICAL SPECIAL TESTS:  Neck flexor muscle endurance test: Negative and Distraction test: Negative   FUNCTIONAL TESTS:  02/19/2022: DNF endurance test: 38 seconds   TODAY'S TREATMENT:        [facility] Adult PT Treatment:                                                DATE: 02/24/2022 Therapeutic Exercise: Bird dog with cross crunch 2x10 BIL Prone chin tuck off side of table with BIL shoulder scaption 3x12 Seated low rows with 35# 2x10 Seated high rows with 35# 2x10 Seated lat pulldowns with 35# 2x10 Seated shoulder rolls 2x10 forward and backward Seated chin tuck with alternating cervical rotation AROM to end range 2x20 Standing arm circle angels 2x5 Manual Therapy: N/A Neuromuscular re-ed: N/A Therapeutic Activity: N/A Modalities: N/A Self Care: N/A   [facility] Adult PT Treatment:                                                DATE: 02/19/2022 Therapeutic Exercise: Standing BIL scaption with 4# dumbbells and chin tuck hold to ball at wall 3x10 Stanidng mini-lunge push-pull with 10# cables 2x10 BIL Manual Therapy: Supine manual cervical  distraction x3 minutes Supine STM to BIL cervical paraspinals/ suboccipitals/ UT x8 minutes Prone CT junction grade V manipulation x1 BIL with cavitation Prone grade V thoracic manipulation x4 with cavitation Neuromuscular re-ed: N/A Therapeutic Activity: Re-administration of FOTO with pt education Re-assessment of objective measures with pt education Modalities: N/A Self Care: N/A   [facility] Adult PT Treatment:                                                DATE: 02/10/2022 Therapeutic Exercise: UBE lvl 1.0 x 3 min while taking subjective Standing row 2x10 20# Bilateral ER GTB 3x10 Seated horizontal abd 2x15 GTB Prone Y 2x10 Seated low row 2x10 25# Seated lat pulldown 2x10 25# Manual Therapy: Skilled palpation to identify trigger points prior to [facility] Trigger Point Dry Needling Treatment: Pre-treatment instruction: Patient instructed on dry needling rationale, procedures,  and possible side effects including pain during treatment (achy,cramping feeling), bruising, drop of blood, lightheadedness, nausea, sweating. Patient Consent Given: No Education handout provided: No Muscles treated: upper traps  Needle size and number: .30x79mm x 3 Electrical stimulation performed: No Parameters: N/A Treatment response/outcome: Twitch response elicited and Palpable decrease in muscle tension Post-treatment instructions: Patient instructed to expect possible mild to moderate muscle soreness later today and/or tomorrow. Patient instructed in methods to reduce muscle soreness and to continue prescribed HEP. If patient was dry needled over the lung field, patient was instructed on signs and symptoms of pneumothorax and, however unlikely, to see immediate medical attention should they occur. Patient was also educated on signs and symptoms of infection and to seek medical attention should they occur. Patient verbalized understanding of these instructions and education.      PATIENT EDUCATION:   Education details: Discussed eval findings, rehab rationale and POC and patient is in agreement  Person educated: Patient Education method: Explanation Education comprehension: verbalized understanding and needs further education   HOME EXERCISE PROGRAM: Access Code: [email or web address] URL: [email or web address] Date: 02/10/2022 Prepared by: Edwinna Areola  Exercises - Seated Scalenes Stretch  - 1 x daily - 5 x weekly - 1 sets - 3 reps - 30s hold - Supine Cervical Sidebending Stretch  - 1 x daily - 5 x weekly - 1 sets - 1 reps - 30s hold - Supine Anterior Scalene Stretch  - 1 x daily - 5 x weekly - 1 sets - 1 reps - 30s hold - Supine Posterior Scalene Stretch  - 1 x daily - 5 x weekly - 1 sets - 1 reps - 30s hold - Standing Shoulder Row with Anchored Resistance  - 1 x daily - 7 x weekly - 3 sets - 10 reps - Seated Cervical Retraction  - 1 x daily - 7 x weekly - 2 sets - 10 reps - 5 seconds hold - Prone Scapular Retraction Y  - 1 x daily - 7 x weekly - 2 sets - 10 reps   ASSESSMENT:   CLINICAL IMPRESSION: Due to pt arriving 16 minutes late to her appointment, the session was truncated today. She responded well to new and progressed exercises and will continue to benefit from skilled PT to address her primary impairments and return to her prior level of function with less limitation.    OBJECTIVE IMPAIRMENTS: decreased mobility, decreased ROM, increased fascial restrictions, increased muscle spasms, impaired flexibility, improper body mechanics, postural dysfunction, and pain.    ACTIVITY LIMITATIONS: carrying, bending, and prolonged positions   PERSONAL FACTORS: Age, Fitness, Past/current experiences, Time since onset of injury/illness/exacerbation, and 1 comorbidity: migraines  are also affecting patient's functional outcome.        GOALS: Goals reviewed with patient? No   SHORT TERM GOALS: Target date: 02/03/2022     Patient to demonstrate independence in HEP  Baseline:  [id number] 02/19/2022: Pt reports HEP adherence Goal status: ACHIEVED       LONG TERM GOALS: Target date: 02/24/2022     Decrease worst pain to 4/10 Baseline: 7/10 02/19/2022: 5-6/10 worst pain Goal status: IN PROGRESS   2.  Decrease L scalene group tenderness/tightness to minimal Baseline: Moderate tenderness/tightness to L scalene group Goal status: INITIAL   3.  Increase FOTO score to 71 Baseline: 48 02/19/2022: 68% Goal status: IN PROGRESS         PLAN:   PT FREQUENCY: 1-2x/week   PT DURATION: 6 weeks   PLANNED INTERVENTIONS: Therapeutic exercises,  Therapeutic activity, Neuromuscular re-education, Balance training, Gait training, Patient/Family education, Self Care, Joint mobilization, Joint manipulation, Dry Needling, Spinal manipulation, Spinal mobilization, Manual therapy, and Re-evaluation   PLAN FOR NEXT SESSION: HEP review and update, posture training, manual techniques, scalene stretching, TPDN, first rib mobilization   Carmelina Dane, PT, DPT 02/24/22 4:12 PM

## 2022-03-03 ENCOUNTER — Ambulatory Visit: Payer: No Typology Code available for payment source | Admitting: Physical Therapy

## 2022-03-03 NOTE — Therapy (Incomplete)
OUTPATIENT PHYSICAL THERAPY TREATMENT NOTE   Patient Name: Erin Harris MRN: [id number] DOB:08-20-1990, 32 y.o., female Today's Date: 03/03/2022  PCP: Janith Lima, MD  REFERRING PROVIDER: Pieter Partridge, DO   END OF SESSION:          Past Medical History:  Diagnosis Date   Allergy    Depression    Frequent headaches    IBS (irritable bowel syndrome)    Migraine    PTSD (post-traumatic stress disorder)    UTI (urinary tract infection)    Past Surgical History:  Procedure Laterality Date   ARTHROSCOPIC REPAIR ACL     CHOLECYSTECTOMY     WISDOM TOOTH EXTRACTION     Patient Active Problem List   Diagnosis Date Noted   Class 2 severe obesity due to excess calories with serious comorbidity and body mass index (BMI) of 39.0 to 39.9 in adult ([place]) 02/05/2022   Moderate persistent asthma with acute exacerbation 05/28/2020   NASH (nonalcoholic steatohepatitis) 02/21/2020   Tachycardia 02/19/2020   Migraine without aura and without status migrainosus, not intractable 02/19/2020   Encounter for general adult medical examination with abnormal findings 02/19/2020   Elevated prolactin level 02/19/2020    REFERRING DIAG: M54.2 (ICD-10-CM) - Cervicalgia   THERAPY DIAG:  No diagnosis found.  Rationale for Evaluation and Treatment Rehabilitation  PERTINENT HISTORY:  Migraine without aura, without status migrainosus, intractable, improved Cervicogenic headache, improved Cervicalgia, likely myofascial   SUBJECTIVE:                                                                                                                                                                                      SUBJECTIVE STATEMENT:  Pt reports slightly increased neck pain over the past couple of days, 4/10 at the worst. This is usually at the end of the work day. She also reports HEP adherence. ***  *** PAIN:  Are you having pain? Yes: NPRS scale: 2/10 Pain location: cervical Pain  description: ache Aggravating factors: prolonged positioning Relieving factors: Thera-gun, moist heat   OBJECTIVE: (objective measures completed at initial evaluation unless otherwise dated)   DIAGNOSTIC FINDINGS:  CLINICAL DATA:  Initial evaluation for chronic neck pain, cervicalgia.   EXAM: MRI CERVICAL SPINE WITHOUT CONTRAST   TECHNIQUE: Multiplanar, multisequence MR imaging of the cervical spine was performed. No intravenous contrast was administered.   COMPARISON:  None available.   FINDINGS: Alignment: Straightening with slight reversal of the normal cervical lordosis. No significant listhesis.   Vertebrae: Vertebral body height maintained without acute or chronic fracture. Bone marrow signal intensity within normal limits. No discrete or worrisome osseous lesions. No abnormal marrow edema.  Cord: Focal prominence of the central canal versus tiny syrinx seen at the level of C6-7 (series 5, image 31). This measures up to 2 mm in maximal diameter. Signal intensity within the visualized cord is otherwise within normal limits.   Posterior Fossa, vertebral arteries, paraspinal tissues: Unremarkable.   Disc levels:   C2-C3: Unremarkable.   C3-C4: Minimal disc bulge. No spinal stenosis. Foramina remain patent.   C4-C5: Minimal disc bulge with endplate spurring. No significant spinal stenosis.   Remain patent.   C5-C6: Minimal disc bulge with superimposed tiny central disc protrusion. No significant spinal stenosis. Foramina remain patent.   C6-C7: Degenerative intervertebral disc space narrowing with diffuse disc osteophyte complex. Flattening and partial effacement of the ventral thecal sac with resultant mild spinal stenosis. Secondary mild cord flattening. Mild left C7 foraminal stenosis. Right neural foramen remains patent.   C7-T1:  Seen only on sagittal projection.  Unremarkable.   Visualized upper thoracic spine demonstrates no significant finding.    IMPRESSION: 1. 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 2. Additional mild noncompressive disc bulging at C3-4 through C5-6 without significant stenosis or neural impingement. 3. Focal prominence of the central canal versus tiny syrinx at the level of C6-7.     Electronically Signed   By: Rise Mu M.D.   On: 09/11/2021 17:18   PATIENT SURVEYS:  FOTO 48(71 predicted) 02/19/2022: 68%   COGNITION: Overall cognitive status: Within functional limits for tasks assessed   SENSATION: Not tested   POSTURE: rounded shoulders   PALPATION: TTP L scalenes           CERVICAL ROM:    Active ROM A/PROM (deg) eval  Flexion 90%  Extension 90%  Right lateral flexion 90%  Left lateral flexion 90%  Right rotation 90%  Left rotation 90%   (Blank rows = not tested)   UPPER EXTREMITY ROM: WNL throughout   Active ROM Right 02/19/2022 Left 02/19/2022  Shoulder flexion  WNL WNL  Shoulder extension WNL WNL  Shoulder abduction      Shoulder adduction      Shoulder extension      Shoulder internal rotation      Shoulder external rotation      Elbow flexion      Elbow extension      Wrist flexion      Wrist extension      Wrist ulnar deviation      Wrist radial deviation      Wrist pronation      Wrist supination       (Blank rows = not tested)   UPPER EXTREMITY MMT: WNL throughout   MMT Right 02/19/2022 Left 02/19/2022  Shoulder flexion  5/5   5/5  Shoulder extension  5/5    5/5  Shoulder abduction  5/5   5/5   Shoulder adduction      Shoulder extension      Shoulder internal rotation  5/5   5/5   Shoulder external rotation  5/5   5/5   Middle trapezius  4/5 4/5  Lower trapezius 3+/5 3+/5  Latissimus dorsi 5/5 5/5  Elbow flexion      Elbow extension      Wrist flexion      Wrist extension      Wrist ulnar deviation      Wrist radial deviation      Wrist pronation      Wrist supination  Grip  strength       (Blank rows = not tested)   CERVICAL SPECIAL TESTS:  Neck flexor muscle endurance test: Negative and Distraction test: Negative   FUNCTIONAL TESTS:  02/19/2022: DNF endurance test: 38 seconds   TODAY'S TREATMENT:       [facility] Adult PT Treatment:                                                DATE: 03/03/22 Therapeutic Exercise: *** Manual Therapy: *** Neuromuscular re-ed: *** Therapeutic Activity: *** Modalities: *** Self Care: [facility] Adult PT Treatment:                                                DATE: 02/24/2022 Therapeutic Exercise: Bird dog with cross crunch 2x10 BIL Prone chin tuck off side of table with BIL shoulder scaption 3x12 Seated low rows with 35# 2x10 Seated high rows with 35# 2x10 Seated lat pulldowns with 35# 2x10 Seated shoulder rolls 2x10 forward and backward Seated chin tuck with alternating cervical rotation AROM to end range 2x20 Standing arm circle angels 2x5 Manual Therapy: N/A Neuromuscular re-ed: N/A Therapeutic Activity: N/A Modalities: N/A Self Care: N/A   [facility] Adult PT Treatment:                                                DATE: 02/19/2022 Therapeutic Exercise: Standing BIL scaption with 4# dumbbells and chin tuck hold to ball at wall 3x10 Stanidng mini-lunge push-pull with 10# cables 2x10 BIL Manual Therapy: Supine manual cervical distraction x3 minutes Supine STM to BIL cervical paraspinals/ suboccipitals/ UT x8 minutes Prone CT junction grade V manipulation x1 BIL with cavitation Prone grade V thoracic manipulation x4 with cavitation Neuromuscular re-ed: N/A Therapeutic Activity: Re-administration of FOTO with pt education Re-assessment of objective measures with pt education Modalities: N/A Self Care: N/A     PATIENT EDUCATION:  Education details: Discussed eval findings, rehab rationale and POC and patient is in agreement  Person educated: Patient Education method: Explanation Education  comprehension: verbalized understanding and needs further education   HOME EXERCISE PROGRAM: Access Code: [email or web address] URL: [email or web address] Date: 02/10/2022 Prepared by: Octavio Manns  Exercises - Seated Scalenes Stretch  - 1 x daily - 5 x weekly - 1 sets - 3 reps - 30s hold - Supine Cervical Sidebending Stretch  - 1 x daily - 5 x weekly - 1 sets - 1 reps - 30s hold - Supine Anterior Scalene Stretch  - 1 x daily - 5 x weekly - 1 sets - 1 reps - 30s hold - Supine Posterior Scalene Stretch  - 1 x daily - 5 x weekly - 1 sets - 1 reps - 30s hold - Standing Shoulder Row with Anchored Resistance  - 1 x daily - 7 x weekly - 3 sets - 10 reps - Seated Cervical Retraction  - 1 x daily - 7 x weekly - 2 sets - 10 reps - 5 seconds hold - Prone Scapular Retraction Y  - 1 x daily - 7 x weekly - 2 sets -  10 reps   ASSESSMENT:   CLINICAL IMPRESSION: ***  *** Due to pt arriving 16 minutes late to her appointment, the session was truncated today. She responded well to new and progressed exercises and will continue to benefit from skilled PT to address her primary impairments and return to her prior level of function with less limitation.    OBJECTIVE IMPAIRMENTS: decreased mobility, decreased ROM, increased fascial restrictions, increased muscle spasms, impaired flexibility, improper body mechanics, postural dysfunction, and pain.    ACTIVITY LIMITATIONS: carrying, bending, and prolonged positions   PERSONAL FACTORS: Age, Fitness, Past/current experiences, Time since onset of injury/illness/exacerbation, and 1 comorbidity: migraines  are also affecting patient's functional outcome.        GOALS: Goals reviewed with patient? No   SHORT TERM GOALS: Target date: 02/03/2022     Patient to demonstrate independence in HEP  Baseline: [id number] 02/19/2022: Pt reports HEP adherence Goal status: ACHIEVED       LONG TERM GOALS: Target date: 02/24/2022 ***      Decrease worst pain to  4/10 Baseline: 7/10 02/19/2022: 5-6/10 worst pain Goal status: IN PROGRESS   2.  Decrease L scalene group tenderness/tightness to minimal Baseline: Moderate tenderness/tightness to L scalene group Goal status: INITIAL   3.  Increase FOTO score to 71 Baseline: 48 02/19/2022: 68% Goal status: IN PROGRESS         PLAN:   PT FREQUENCY: 1-2x/week   PT DURATION: 6 weeks   PLANNED INTERVENTIONS: Therapeutic exercises, Therapeutic activity, Neuromuscular re-education, Balance training, Gait training, Patient/Family education, Self Care, Joint mobilization, Joint manipulation, Dry Needling, Spinal manipulation, Spinal mobilization, Manual therapy, and Re-evaluation   PLAN FOR NEXT SESSION: HEP review and update, posture training, manual techniques, scalene stretching, TPDN, first rib mobilization ***    Leeroy Cha PT, DPT 03/03/2022 8:35 AM

## 2022-03-09 NOTE — Therapy (Incomplete)
OUTPATIENT PHYSICAL THERAPY TREATMENT NOTE + RECERTIFICATION   Patient Name: Erin Harris MRN: [id number] DOB:12/10/1990, 32 y.o., female Today's Date: 03/09/2022  PCP: Janith Lima, MD  REFERRING PROVIDER: Pieter Partridge, DO   END OF SESSION:          Past Medical History:  Diagnosis Date   Allergy    Depression    Frequent headaches    IBS (irritable bowel syndrome)    Migraine    PTSD (post-traumatic stress disorder)    UTI (urinary tract infection)    Past Surgical History:  Procedure Laterality Date   ARTHROSCOPIC REPAIR ACL     CHOLECYSTECTOMY     WISDOM TOOTH EXTRACTION     Patient Active Problem List   Diagnosis Date Noted   Class 2 severe obesity due to excess calories with serious comorbidity and body mass index (BMI) of 39.0 to 39.9 in adult ([place]) 02/05/2022   Moderate persistent asthma with acute exacerbation 05/28/2020   NASH (nonalcoholic steatohepatitis) 02/21/2020   Tachycardia 02/19/2020   Migraine without aura and without status migrainosus, not intractable 02/19/2020   Encounter for general adult medical examination with abnormal findings 02/19/2020   Elevated prolactin level 02/19/2020    REFERRING DIAG: M54.2 (ICD-10-CM) - Cervicalgia   THERAPY DIAG:  No diagnosis found.  Rationale for Evaluation and Treatment Rehabilitation  PERTINENT HISTORY:  Migraine without aura, without status migrainosus, intractable, improved Cervicogenic headache, improved Cervicalgia, likely myofascial   SUBJECTIVE:                                                                                                                                                                                      SUBJECTIVE STATEMENT:  Pt reports slightly increased neck pain over the past couple of days, 4/10 at the worst. This is usually at the end of the work day. She also reports HEP adherence. ***  *** PAIN:  Are you having pain? Yes: NPRS scale: 2/10 Pain location:  cervical Pain description: ache Aggravating factors: prolonged positioning Relieving factors: Thera-gun, moist heat   OBJECTIVE: (objective measures completed at initial evaluation unless otherwise dated)   DIAGNOSTIC FINDINGS:  CLINICAL DATA:  Initial evaluation for chronic neck pain, cervicalgia.   EXAM: MRI CERVICAL SPINE WITHOUT CONTRAST   TECHNIQUE: Multiplanar, multisequence MR imaging of the cervical spine was performed. No intravenous contrast was administered.   COMPARISON:  None available.   FINDINGS: Alignment: Straightening with slight reversal of the normal cervical lordosis. No significant listhesis.   Vertebrae: Vertebral body height maintained without acute or chronic fracture. Bone marrow signal intensity within normal limits. No discrete or worrisome osseous lesions. No abnormal marrow edema.  Cord: Focal prominence of the central canal versus tiny syrinx seen at the level of C6-7 (series 5, image 31). This measures up to 2 mm in maximal diameter. Signal intensity within the visualized cord is otherwise within normal limits.   Posterior Fossa, vertebral arteries, paraspinal tissues: Unremarkable.   Disc levels:   C2-C3: Unremarkable.   C3-C4: Minimal disc bulge. No spinal stenosis. Foramina remain patent.   C4-C5: Minimal disc bulge with endplate spurring. No significant spinal stenosis.   Remain patent.   C5-C6: Minimal disc bulge with superimposed tiny central disc protrusion. No significant spinal stenosis. Foramina remain patent.   C6-C7: Degenerative intervertebral disc space narrowing with diffuse disc osteophyte complex. Flattening and partial effacement of the ventral thecal sac with resultant mild spinal stenosis. Secondary mild cord flattening. Mild left C7 foraminal stenosis. Right neural foramen remains patent.   C7-T1:  Seen only on sagittal projection.  Unremarkable.   Visualized upper thoracic spine demonstrates no  significant finding.   IMPRESSION: 1. 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 2. Additional mild noncompressive disc bulging at C3-4 through C5-6 without significant stenosis or neural impingement. 3. Focal prominence of the central canal versus tiny syrinx at the level of C6-7.     Electronically Signed   By: Jeannine Boga M.D.   On: 09/11/2021 17:18   PATIENT SURVEYS:  FOTO 48(71 predicted) 02/19/2022: 68%   COGNITION: Overall cognitive status: Within functional limits for tasks assessed   SENSATION: Not tested   POSTURE: rounded shoulders   PALPATION: TTP L scalenes           CERVICAL ROM:    Active ROM A/PROM (deg) eval  Flexion 90%  Extension 90%  Right lateral flexion 90%  Left lateral flexion 90%  Right rotation 90%  Left rotation 90%   (Blank rows = not tested)   UPPER EXTREMITY ROM: WNL throughout   Active ROM Right 02/19/2022 Left 02/19/2022  Shoulder flexion  WNL WNL  Shoulder extension WNL WNL  Shoulder abduction      Shoulder adduction      Shoulder extension      Shoulder internal rotation      Shoulder external rotation      Elbow flexion      Elbow extension      Wrist flexion      Wrist extension      Wrist ulnar deviation      Wrist radial deviation      Wrist pronation      Wrist supination       (Blank rows = not tested)   UPPER EXTREMITY MMT: WNL throughout   MMT Right 02/19/2022 Left 02/19/2022  Shoulder flexion  5/5   5/5  Shoulder extension  5/5    5/5  Shoulder abduction  5/5   5/5   Shoulder adduction      Shoulder extension      Shoulder internal rotation  5/5   5/5   Shoulder external rotation  5/5   5/5   Middle trapezius  4/5 4/5  Lower trapezius 3+/5 3+/5  Latissimus dorsi 5/5 5/5  Elbow flexion      Elbow extension      Wrist flexion      Wrist extension      Wrist ulnar deviation      Wrist radial deviation      Wrist pronation      Wrist  supination  Grip strength       (Blank rows = not tested)   CERVICAL SPECIAL TESTS:  Neck flexor muscle endurance test: Negative and Distraction test: Negative   FUNCTIONAL TESTS:  02/19/2022: DNF endurance test: 38 seconds   TODAY'S TREATMENT:       [facility] Adult PT Treatment:                                                DATE: 03/10/22 Therapeutic Exercise: *** Manual Therapy: *** Neuromuscular re-ed: *** Therapeutic Activity: *** Modalities: *** Self Care: [facility] Adult PT Treatment:                                                DATE: 02/24/2022 Therapeutic Exercise: Bird dog with cross crunch 2x10 BIL Prone chin tuck off side of table with BIL shoulder scaption 3x12 Seated low rows with 35# 2x10 Seated high rows with 35# 2x10 Seated lat pulldowns with 35# 2x10 Seated shoulder rolls 2x10 forward and backward Seated chin tuck with alternating cervical rotation AROM to end range 2x20 Standing arm circle angels 2x5 Manual Therapy: N/A Neuromuscular re-ed: N/A Therapeutic Activity: N/A Modalities: N/A Self Care: N/A   [facility] Adult PT Treatment:                                                DATE: 02/19/2022 Therapeutic Exercise: Standing BIL scaption with 4# dumbbells and chin tuck hold to ball at wall 3x10 Stanidng mini-lunge push-pull with 10# cables 2x10 BIL Manual Therapy: Supine manual cervical distraction x3 minutes Supine STM to BIL cervical paraspinals/ suboccipitals/ UT x8 minutes Prone CT junction grade V manipulation x1 BIL with cavitation Prone grade V thoracic manipulation x4 with cavitation Neuromuscular re-ed: N/A Therapeutic Activity: Re-administration of FOTO with pt education Re-assessment of objective measures with pt education Modalities: N/A Self Care: N/A     PATIENT EDUCATION:  Education details: ***  Person educated: Patient Education method: Explanation Education comprehension: verbalized understanding and needs further  education   HOME EXERCISE PROGRAM: Access Code: [email or web address] URL: [email or web address] Date: 02/10/2022 Prepared by: Octavio Manns  Exercises - Seated Scalenes Stretch  - 1 x daily - 5 x weekly - 1 sets - 3 reps - 30s hold - Supine Cervical Sidebending Stretch  - 1 x daily - 5 x weekly - 1 sets - 1 reps - 30s hold - Supine Anterior Scalene Stretch  - 1 x daily - 5 x weekly - 1 sets - 1 reps - 30s hold - Supine Posterior Scalene Stretch  - 1 x daily - 5 x weekly - 1 sets - 1 reps - 30s hold - Standing Shoulder Row with Anchored Resistance  - 1 x daily - 7 x weekly - 3 sets - 10 reps - Seated Cervical Retraction  - 1 x daily - 7 x weekly - 2 sets - 10 reps - 5 seconds hold - Prone Scapular Retraction Y  - 1 x daily - 7 x weekly - 2 sets - 10 reps   ASSESSMENT:   CLINICAL IMPRESSION: ***  ***  Due to pt arriving 16 minutes late to her appointment, the session was truncated today. She responded well to new and progressed exercises and will continue to benefit from skilled PT to address her primary impairments and return to her prior level of function with less limitation.    OBJECTIVE IMPAIRMENTS: decreased mobility, decreased ROM, increased fascial restrictions, increased muscle spasms, impaired flexibility, improper body mechanics, postural dysfunction, and pain.    ACTIVITY LIMITATIONS: carrying, bending, and prolonged positions   PERSONAL FACTORS: Age, Fitness, Past/current experiences, Time since onset of injury/illness/exacerbation, and 1 comorbidity: migraines  are also affecting patient's functional outcome.        GOALS: Goals reviewed with patient? No   SHORT TERM GOALS: Target date: 02/03/2022     Patient to demonstrate independence in HEP  Baseline: [email or web address] 02/19/2022: Pt reports HEP adherence Goal status: ACHIEVED       LONG TERM GOALS: Target date: 02/24/2022 ***      Decrease worst pain to 4/10 Baseline: 7/10 02/19/2022: 5-6/10 worst pain Goal  status: IN PROGRESS   2.  Decrease L scalene group tenderness/tightness to minimal Baseline: Moderate tenderness/tightness to L scalene group Goal status: INITIAL   3.  Increase FOTO score to 71 Baseline: 48 02/19/2022: 68% Goal status: IN PROGRESS         PLAN:   PT FREQUENCY: 1-2x/week   PT DURATION: 6 weeks   PLANNED INTERVENTIONS: Therapeutic exercises, Therapeutic activity, Neuromuscular re-education, Balance training, Gait training, Patient/Family education, Self Care, Joint mobilization, Joint manipulation, Dry Needling, Spinal manipulation, Spinal mobilization, Manual therapy, and Re-evaluation   PLAN FOR NEXT SESSION: HEP review and update, posture training, manual techniques, scalene stretching, TPDN, first rib mobilization ***    Leeroy Cha PT, DPT 03/09/2022 9:24 AM

## 2022-03-10 ENCOUNTER — Ambulatory Visit: Payer: No Typology Code available for payment source | Admitting: Physical Therapy

## 2022-03-16 ENCOUNTER — Telehealth: Payer: Self-pay

## 2022-03-16 NOTE — Telephone Encounter (Signed)
Pharmacy Patient Advocate Encounter  Received notification from [facility] that the request for prior authorization for [facility] 2.5MG/0.5ML pen-injectors has been denied due to not meeting criteria for approval.      Please be advised we currently do not have a Pharmacist to review denials, therefore you will need to process appeals accordingly as needed. Thanks for your support at this time.   You may call [phone] or fax [phone], to appeal.  Denial letter attached to charts

## 2022-03-19 ENCOUNTER — Other Ambulatory Visit: Payer: Self-pay | Admitting: Internal Medicine

## 2022-03-19 DIAGNOSIS — G43009 Migraine without aura, not intractable, without status migrainosus: Secondary | ICD-10-CM

## 2022-03-19 DIAGNOSIS — R000 Tachycardia, unspecified: Secondary | ICD-10-CM

## 2022-04-08 ENCOUNTER — Other Ambulatory Visit (HOSPITAL_COMMUNITY): Payer: Self-pay

## 2022-04-22 ENCOUNTER — Other Ambulatory Visit (HOSPITAL_COMMUNITY): Payer: Self-pay

## 2022-05-01 ENCOUNTER — Ambulatory Visit: Payer: No Typology Code available for payment source | Admitting: Neurology

## 2022-05-04 ENCOUNTER — Other Ambulatory Visit: Payer: Self-pay | Admitting: Internal Medicine

## 2022-05-04 DIAGNOSIS — E66812 Obesity, class 2: Secondary | ICD-10-CM

## 2022-05-05 IMAGING — MR MR HEAD W/O CM
11 series · 48 of 48 positions shown · non-contrast
Comparison: None.

CLINICAL DATA: Chronic migraine headaches with worsening.

EXAM:
MRI HEAD WITHOUT CONTRAST
TECHNIQUE: Multiplanar, multiecho pulse sequences of the brain and surrounding
structures were obtained without intravenous contrast.

[Series 5: T1 · sagittal · 4.0mm · 0.75mm/px · 2 of 31 slices shown (1 of 2)]
[im 1/31]
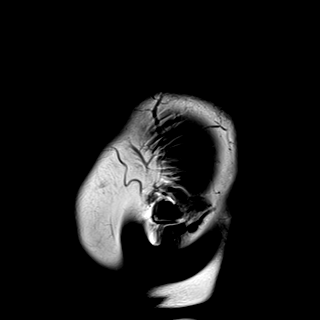
[im 31/31]
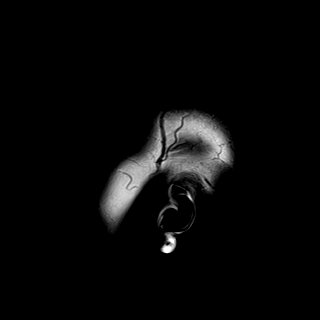

[Series 6: DWI · axial · 3.0mm · 0.94mm/px · z∈[-85,+55]mm · 10 of 160 slices shown (1 of 3)]
[im 1/160]
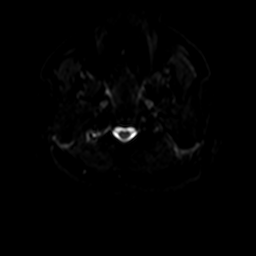
[im 18/160]
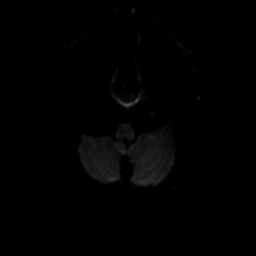
[im 36/160]
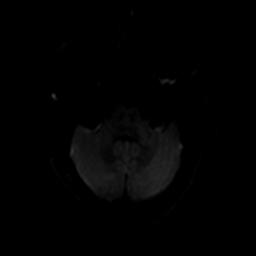
[im 54/160]
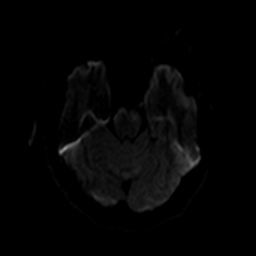
[im 71/160]
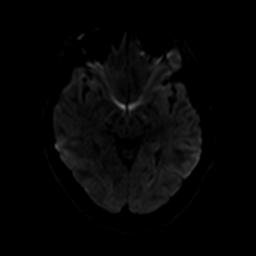
[im 89/160]
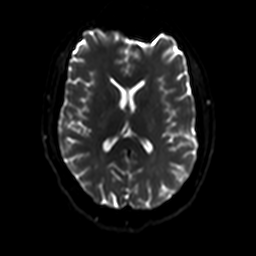
[im 107/160]
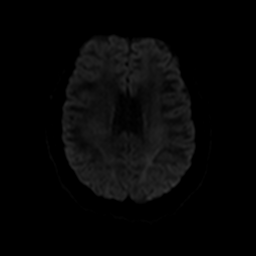
[im 124/160]
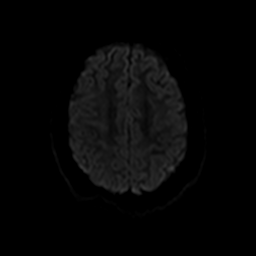
[im 142/160]
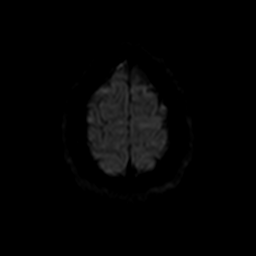
[im 160/160]
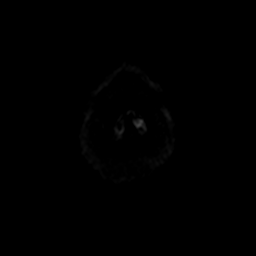

[Series 7: ax dwi_tracew · axial · 3.0mm · 0.94mm/px · z∈[-85,+55]mm · 5 of 80 slices shown]
[im 1/80]
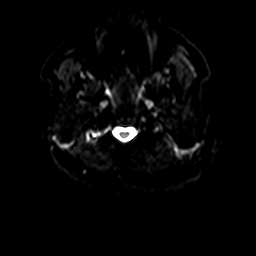
[im 20/80]
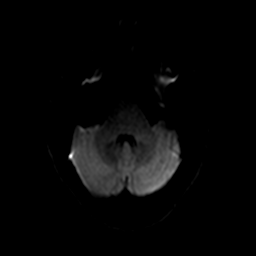
[im 40/80]
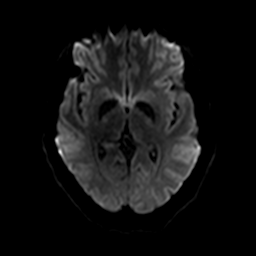
[im 60/80]
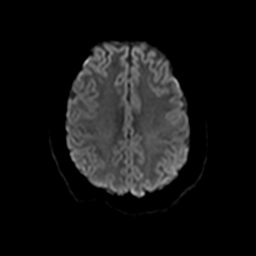
[im 80/80]
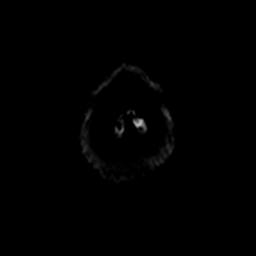

[Series 8: ax dwi_adc · axial · 3.0mm · 0.94mm/px · z∈[-85,+55]mm · 3 of 40 slices shown]
[im 1/40]
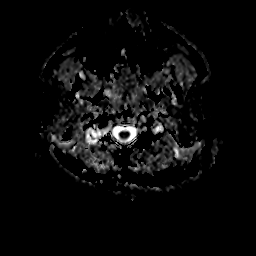
[im 20/40]
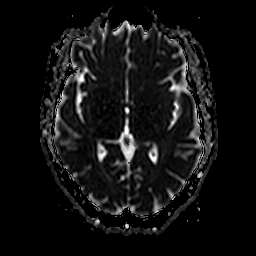
[im 40/40]
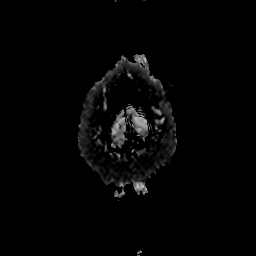

[Series 9: DWI · coronal · 5.0mm · 1.44mm/px · 4 of 60 slices shown (2 of 3)]
[im 1/60]
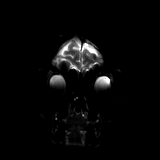
[im 20/60]
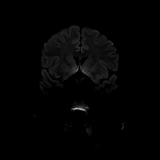
[im 40/60]
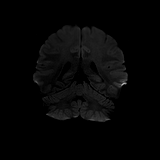
[im 60/60]
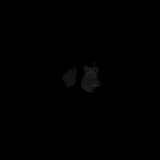

[Series 10: DWI · coronal · 5.0mm · 1.44mm/px · 2 of 30 slices shown (3 of 3)]
[im 1/30]
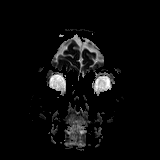
[im 30/30]
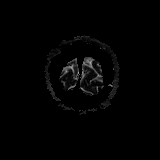

[Series 11: T2 · axial · 4.0mm · 0.36mm/px · z∈[-83,+52]mm · 2 of 27 slices shown (1 of 2)]
[im 1/27]
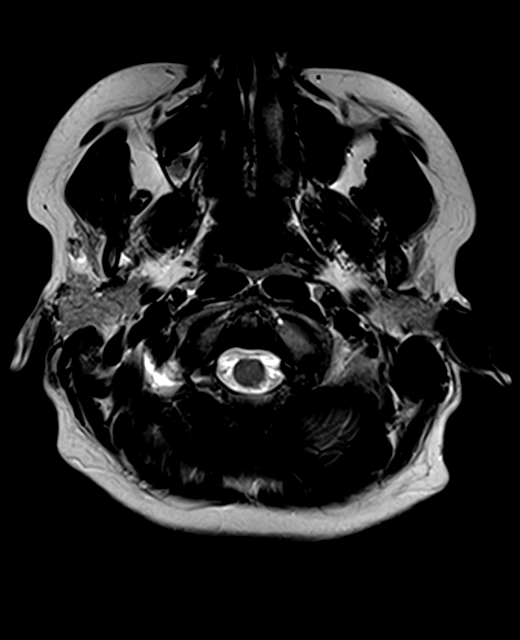
[im 27/27]
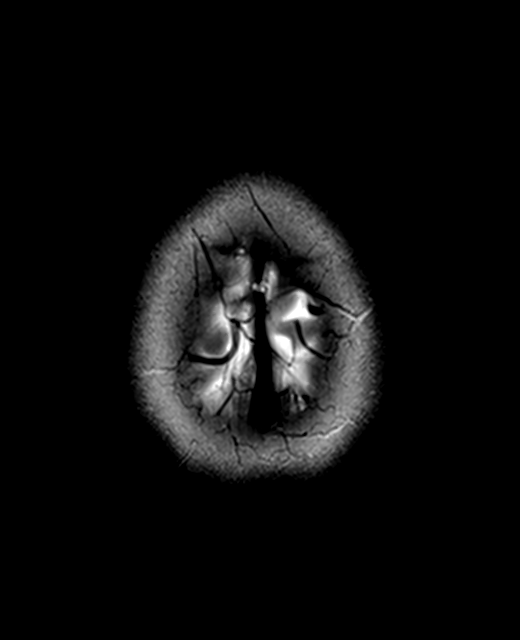

[Series 12: FLAIR · axial · 3.0mm · 0.72mm/px · z∈[-90,+59]mm · 2 of 26 slices shown]
[im 1/26]
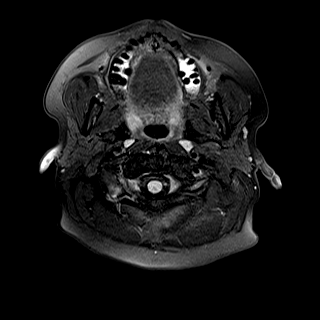
[im 26/26]
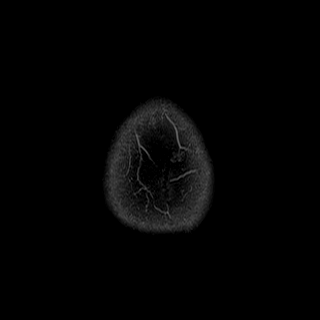

[Series 14: swi_images · axial · 1.5mm · 0.90mm/px · z∈[-86,+56]mm · 6 of 96 slices shown]
[im 1/96]
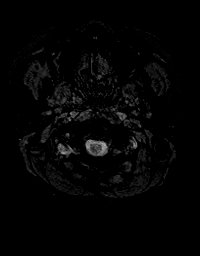
[im 20/96]
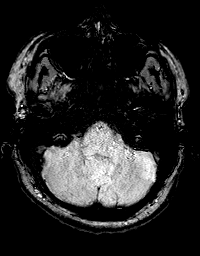
[im 39/96]
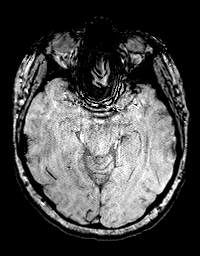
[im 58/96]
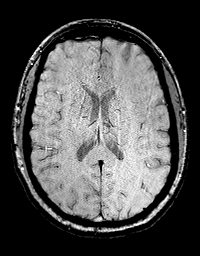
[im 77/96]
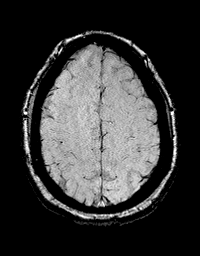
[im 96/96]
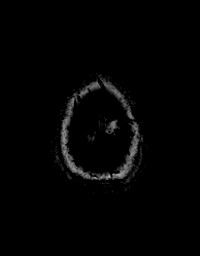

[Series 15: T1 · axial · 1.0mm · 0.94mm/px · z∈[-95,+64]mm · 10 of 160 slices shown (2 of 2)]
[im 1/160]
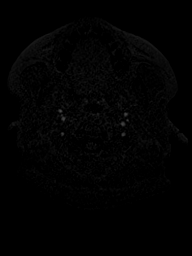
[im 18/160]
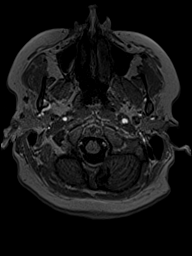
[im 36/160]
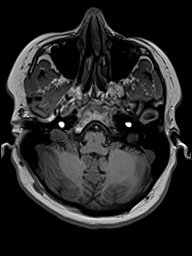
[im 54/160]
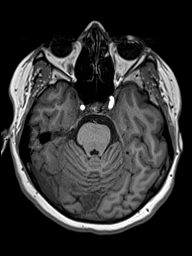
[im 71/160]
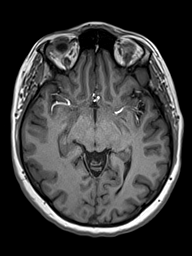
[im 89/160]
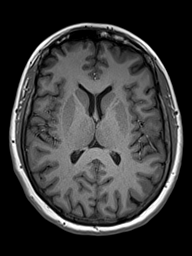
[im 107/160]
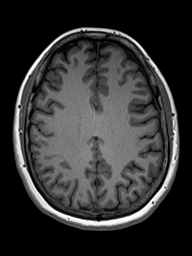
[im 124/160]
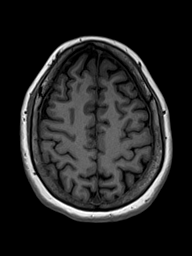
[im 142/160]
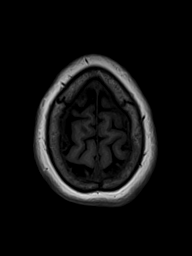
[im 160/160]
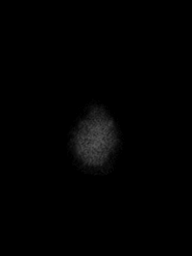

[Series 16: T2 · coronal · 4.5mm · 0.36mm/px · 2 of 30 slices shown (2 of 2)]
[im 1/30]
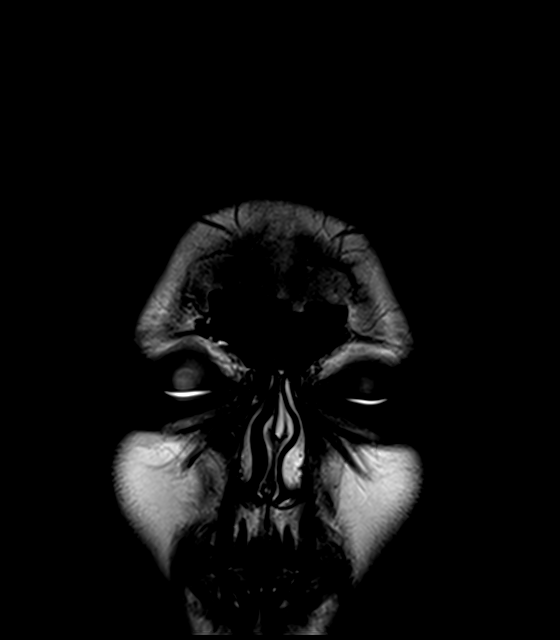
[im 30/30]
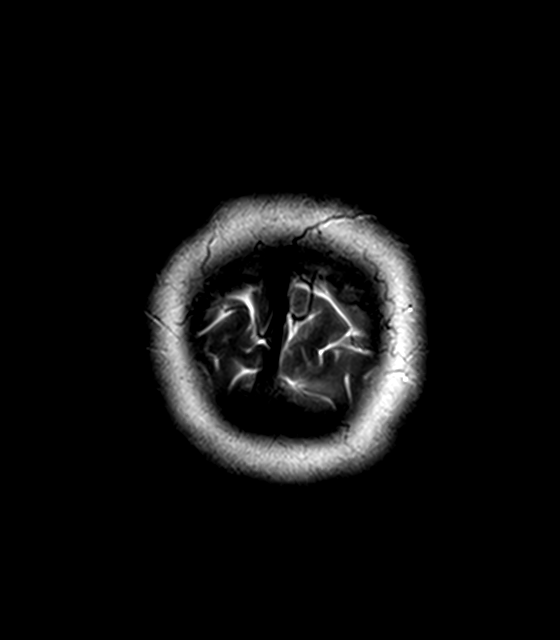

[48 of 48 positions shown; findings below may reference images not displayed]

FINDINGS: Brain: The brain has a normal appearance without evidence of
malformation, atrophy, old or acute small or large vessel
infarction, mass lesion, hemorrhage, hydrocephalus or extra-axial
collection.

Vascular: Major vessels at the base of the brain show flow. Venous
sinuses appear patent.

Skull and upper cervical spine: Normal.

Sinuses/Orbits: Clear/normal.

Other: None significant.
IMPRESSION: Normal examination. No abnormality seen to explain headache.

## 2022-05-08 ENCOUNTER — Other Ambulatory Visit (HOSPITAL_COMMUNITY): Payer: Self-pay

## 2022-05-20 ENCOUNTER — Ambulatory Visit: Payer: No Typology Code available for payment source | Admitting: Neurology

## 2022-05-29 ENCOUNTER — Encounter: Payer: Self-pay | Admitting: Neurology

## 2022-05-29 ENCOUNTER — Ambulatory Visit: Payer: No Typology Code available for payment source | Admitting: Neurology

## 2022-05-29 VITALS — BP 130/80 | HR 57 | Ht 67.0 in | Wt 234.4 lb

## 2022-05-29 DIAGNOSIS — G43009 Migraine without aura, not intractable, without status migrainosus: Secondary | ICD-10-CM | POA: Diagnosis not present

## 2022-05-29 DIAGNOSIS — M542 Cervicalgia: Secondary | ICD-10-CM

## 2022-05-29 DIAGNOSIS — G4486 Cervicogenic headache: Secondary | ICD-10-CM | POA: Diagnosis not present

## 2022-05-29 MED ORDER — TIZANIDINE HCL 2 MG PO TABS: 2.0000 mg | ORAL_TABLET | Freq: Four times a day (QID) | ORAL | 5 refills | Status: DC | PRN

## 2022-05-29 MED ORDER — ONDANSETRON 4 MG PO TBDP: 4.0000 mg | ORAL_TABLET | Freq: Three times a day (TID) | ORAL | 11 refills | Status: DC | PRN

## 2022-05-29 MED ORDER — RIZATRIPTAN BENZOATE 10 MG PO TBDP: ORAL_TABLET | ORAL | 5 refills | Status: DC

## 2022-05-29 MED ORDER — NURTEC 75 MG PO TBDP: 75.0000 mg | ORAL_TABLET | Freq: Every day | ORAL | 11 refills | Status: DC | PRN

## 2022-05-29 NOTE — Progress Notes (Signed)
NEUROLOGY FOLLOW UP OFFICE NOTE  Erin Harris [id number]  Assessment/Plan:   Migraine without aura, without status migrainosus, intractable, improved Cervicogenic headache Cervicalgia, myofascial   Neck pain:  change from baclofen to tizanidine 2-4mg .  She will check to see if anybody at work may be able to perform trigger point injections.  Otherwise, I can refer to Dr. Antoine Primas of [facility].   Migraine prevention:  Qulipta 60mg  daily  Continue propranolol ER 60mg  daily.  Also on venlafaxine XR 225mg  daily for anxiety which also is helpful for migraine. Migraine rescue:  Nurtec PRN, rizatriptan second line Limit use of pain relievers to no more than 2 days out of week to prevent risk of rebound or medication-overuse headache. Keep headache diary Follow up 6 months.       Subjective:  Erin Harris is a 32 year old female with PTSD, depression, IBS, tachycardia and migraine who follows up for migraine and neck pain.   UPDATE: Migraines are improved on Qulipta.  She has had 2 migraines since last visit.  Usually occurs at end of day or evening.  One responded quickly to Nurtec.  The second one didn't respond to Nurtec or rizatriptan.  She had to fall asleep and when she woke up, it had abated.   Unfortunately, her dull headaches are daily again.  This is related to her ongoing neck pain.  Started baclofen and referred to PT.   Still performing home neck stretches.  Physical therapy and dry needling helps for a short while but pain returns.  She rarely will treat these headaches with ibuprofen    Frequency of abortive medication: rarely Current NSAIDS/analgesics:  none Current triptans:  rizatriptan 10mg  (no longer using) Current ergotamine:  none Current anti-emetic:  Zofran 4mg  Current muscle relaxants:  baclofen 10mg  TID PRN (neck pain) Current Antihypertensive medications:  propranolol ER 60mg  daily Current Antidepressant medications:  venlafaxine XR 225mg   daily, bupropion XL 300mg  daily, trazodone 50mg  QHS (insomnia) Current Anticonvulsant medications:  none Current anti-CGRP:  Qulipta 60mg  daily, Nurtec (rescue) Current Vitamins/Herbal/Supplements:  none Current Antihistamines/Decongestants:  none Other therapy:  none Hormone/birth control:  none   Caffeine:  None Diet:  Hydrates with water.  No soda.   Exercise:  not routine Depression:  yes; Anxiety:  yes Other pain:  no Sleep hygiene:  okay.  Trazodone helps.     HISTORY:  Onset:  headaches in high school.  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  Location:  base of skull/back of neck radiating to jaw and ears bilaterally Quality:  throbbing, pressure Intensity:  6-7/10.   Aura:  absent.  One time had central blurred vision in left eye lasting 12 hours. Prodrome:  absent Associated symptoms:  Nausea, photophobia, phonophobia, sometimes vomiting.  She denies associated visual disturbance, unilateral numbness or weakness. Duration:  3-4 days (within a few hours with Bernita Raisin) Frequency:  once a week to once every other week (1 every 2 months on Qulipta) Frequency of abortive medication: ibuprofen 1 to 2 days a week Triggers:  stress, poor sleep, poor diet, [place] food, chocolate, hormonal Relieving factors:  resting in quiet dark room Activity:  aggravates In between, keeps a constant low-grade dull frontal/periorbital-maxillary headache   MRI of brain without contrast on 03/09/2020 was normal.  MRI of cervical spine without contrast 09/11/2021 revealed degenerative changes with osteophyte at C6-7 causing mild canal and left C7 foraminal stenosis, mild noncompressive disc bulges at C3-4 through C5-6 and questionable tiny  syrinx at level of C6-7.   Past NSAIDS/analgesics:  Excedrin, acetaminophen, ibuprofen Past abortive triptans:  sumatriptan (caused palpitations) Past abortive ergotamine:  none Past muscle relaxants:  Flexeril Past anti-emetic:  promethazine Past  antihypertensive medications:  metoprolol Past antidepressant medications:  citalopram Past anticonvulsant medications:  none Past anti-CGRP:  Aimovig 140mg , Ubrelvy 100mg  (effective but no longer covered) Past vitamins/Herbal/Supplements:  none Past antihistamines/decongestants:  none Other past therapies:  trigger point injections     Family history of migraines:  mother  PAST MEDICAL HISTORY: Past Medical History:  Diagnosis Date   Allergy    Depression    Frequent headaches    IBS (irritable bowel syndrome)    Migraine    PTSD (post-traumatic stress disorder)    UTI (urinary tract infection)     MEDICATIONS: Current Outpatient Medications on File Prior to Visit  Medication Sig Dispense Refill   Atogepant (QULIPTA) 60 MG TABS Take 60 mg by mouth daily. 30 tablet 11   baclofen (LIORESAL) 10 MG tablet Take 1 tablet (10 mg total) by mouth 3 (three) times daily as needed for muscle spasms. 90 each 5   buPROPion (WELLBUTRIN XL) 300 MG 24 hr tablet Take 1 tablet (300 mg total) by mouth daily. 90 tablet 1   CRYSELLE-28 0.3-30 MG-MCG tablet TAKE 1 TABLET BY MOUTH EVERY DAY 84 tablet 1   dicyclomine (BENTYL) 10 MG capsule TAKE 1 CAPSULE (10 MG TOTAL) BY MOUTH 3 (THREE) TIMES DAILY BEFORE MEALS. (Patient taking differently: Take 10 mg by mouth 3 (three) times daily before meals. PRN) 270 capsule 0   ondansetron (ZOFRAN-ODT) 4 MG disintegrating tablet Take 1 tablet (4 mg total) by mouth every 8 (eight) hours as needed for nausea or vomiting. 20 tablet 5   propranolol ER (INDERAL LA) 60 MG 24 hr capsule TAKE 1 CAPSULE BY MOUTH EVERY DAY 90 capsule 0   Rimegepant Sulfate (NURTEC) 75 MG TBDP Take 75 mg by mouth daily as needed. 8 tablet 5   rizatriptan (MAXALT-MLT) 10 MG disintegrating tablet Take 1 tablet earliest onset of migraine.  May repeat in 2 hours if needed.  Maximum 2 tablets in 24 hours. 10 tablet 5   tirzepatide (MOUNJARO) 2.5 MG/0.5ML Pen INJECT 2.5 MG SUBCUTANEOUSLY WEEKLY 2 mL  0   traZODone (DESYREL) 50 MG tablet Take 1 tablet (50 mg total) by mouth at bedtime. 90 tablet 1   venlafaxine (EFFEXOR) 75 MG tablet TAKE 1 TABLET BY MOUTH EVERY DAY 90 tablet 1   venlafaxine XR (EFFEXOR-XR) 150 MG 24 hr capsule Take 1 capsule (150 mg total) by mouth daily. (Patient taking differently: Take 150 mg by mouth daily. 2 tab to total 300 mg) 90 capsule 1   No current facility-administered medications on file prior to visit.    ALLERGIES: Allergies  Allergen Reactions   Imitrex [Sumatriptan] Palpitations   Lactose Other (See Comments)    GI Issues   Nickel Rash    FAMILY HISTORY: Family History  Problem Relation Age of Onset   Migraines Mother    Diabetes Father    Hyperlipidemia Father    Hypertension Father    Hearing loss Maternal Grandfather    Heart disease Maternal Grandfather    Early death Paternal Grandmother    Heart disease Paternal Grandmother    Hearing loss Paternal Grandfather    Cancer Paternal Grandfather    Cancer Sister    Birth defects Sister    Heart disease Sister    Alcohol abuse Brother  Objective:  Blood pressure 130/80, pulse (!) 57, height 5\' 7"  (1.702 m), weight 234 lb 6.4 oz (106.3 kg), SpO2 94 %. General: No acute distress.  Patient appears well-groomed.   Head:  Normocephalic/atraumatic Eyes:  Fundi examined but not visualized Neck: supple, right paraspinal tenderness,reduced range of motion turning to right Heart:  Regular rate and rhythm Neurological Exam: alert and oriented.  Speech fluent and not dysarthric, language intact.  CN II-XII intact. Bulk and tone normal, muscle strength 5/5 throughout.  Sensation to light touch intact.  Deep tendon reflexes 2+ throughout.  Finger to nose testing intact.  Gait normal, Romberg negative.   Shon Millet, DO  CC: Sanda Linger, MD

## 2022-06-02 ENCOUNTER — Other Ambulatory Visit: Payer: Self-pay | Admitting: Internal Medicine

## 2022-06-18 ENCOUNTER — Other Ambulatory Visit: Payer: Self-pay | Admitting: Internal Medicine

## 2022-06-18 DIAGNOSIS — G43009 Migraine without aura, not intractable, without status migrainosus: Secondary | ICD-10-CM

## 2022-06-18 DIAGNOSIS — R000 Tachycardia, unspecified: Secondary | ICD-10-CM

## 2022-06-25 ENCOUNTER — Encounter: Payer: Self-pay | Admitting: Internal Medicine

## 2022-06-29 ENCOUNTER — Ambulatory Visit: Payer: No Typology Code available for payment source | Admitting: Internal Medicine

## 2022-06-29 VITALS — BP 118/72 | HR 95 | Temp 98.2°F | Resp 16 | Ht 67.0 in | Wt 223.0 lb

## 2022-06-29 DIAGNOSIS — J0100 Acute maxillary sinusitis, unspecified: Secondary | ICD-10-CM | POA: Diagnosis not present

## 2022-06-29 DIAGNOSIS — J301 Allergic rhinitis due to pollen: Secondary | ICD-10-CM | POA: Diagnosis not present

## 2022-06-29 MED ORDER — METHYLPREDNISOLONE 4 MG PO TBPK: ORAL_TABLET | ORAL | 0 refills | Status: AC

## 2022-06-29 MED ORDER — AMOXICILLIN-POT CLAVULANATE 875-125 MG PO TABS: 1.0000 | ORAL_TABLET | Freq: Two times a day (BID) | ORAL | 0 refills | Status: AC

## 2022-06-29 NOTE — Patient Instructions (Signed)

## 2022-06-29 NOTE — Progress Notes (Unsigned)
Subjective:  Patient ID: Erin Harris, female    DOB: 05/01/1990  Age: 32 y.o. MRN: [id number]  CC: Sinusitis   HPI Erin Harris presents for f/up ----  She complains of a 3-week history of symptoms that started with sore throat, runny nose, nasal congestion, nonproductive cough.  Over the last few days the symptoms have worsened and now she has left facial pain and is producing thick yellow-green phlegm from her nose.  She is not getting much symptom relief with DayQuil or NyQuil.  She denies night sweats, fever, chills, shortness of breath, or hemoptysis.  Outpatient Medications Prior to Visit  Medication Sig Dispense Refill   Atogepant (QULIPTA) 60 MG TABS Take 60 mg by mouth daily. 30 tablet 11   buPROPion (WELLBUTRIN XL) 300 MG 24 hr tablet Take 1 tablet (300 mg total) by mouth daily. 90 tablet 1   CRYSELLE-28 0.3-30 MG-MCG tablet TAKE 1 TABLET BY MOUTH EVERY DAY 84 tablet 1   dicyclomine (BENTYL) 10 MG capsule TAKE 1 CAPSULE (10 MG TOTAL) BY MOUTH 3 (THREE) TIMES DAILY BEFORE MEALS. (Patient taking differently: Take 10 mg by mouth 3 (three) times daily before meals. PRN) 270 capsule 0   ondansetron (ZOFRAN-ODT) 4 MG disintegrating tablet Take 1 tablet (4 mg total) by mouth every 8 (eight) hours as needed for nausea or vomiting. 20 tablet 11   propranolol ER (INDERAL LA) 60 MG 24 hr capsule TAKE 1 CAPSULE BY MOUTH EVERY DAY 90 capsule 0   Rimegepant Sulfate (NURTEC) 75 MG TBDP Take 1 tablet (75 mg total) by mouth daily as needed. 8 tablet 11   rizatriptan (MAXALT-MLT) 10 MG disintegrating tablet Take 1 tablet earliest onset of migraine.  May repeat in 2 hours if needed.  Maximum 2 tablets in 24 hours. 10 tablet 5   tirzepatide (MOUNJARO) 2.5 MG/0.5ML Pen INJECT 2.5 MG SUBCUTANEOUSLY WEEKLY 2 mL 0   traZODone (DESYREL) 50 MG tablet Take 1 tablet (50 mg total) by mouth at bedtime. 90 tablet 1   venlafaxine XR (EFFEXOR-XR) 150 MG 24 hr capsule Take 1 capsule (150 mg total) by mouth daily.  (Patient taking differently: Take 150 mg by mouth daily. 2 tab to total 300 mg) 90 capsule 1   tiZANidine (ZANAFLEX) 2 MG tablet Take 1 tablet (2 mg total) by mouth every 6 (six) hours as needed for muscle spasms. 30 tablet 5   No facility-administered medications prior to visit.    ROS Review of Systems  Constitutional:  Positive for fatigue. Negative for chills, diaphoresis and unexpected weight change.  HENT:  Positive for congestion, postnasal drip, rhinorrhea, sinus pressure, sinus pain and sore throat. Negative for facial swelling, nosebleeds and trouble swallowing.   Respiratory:  Positive for cough. Negative for chest tightness, shortness of breath and wheezing.   Cardiovascular:  Negative for chest pain, palpitations and leg swelling.  Gastrointestinal:  Negative for abdominal pain, constipation, diarrhea, nausea and vomiting.  Genitourinary: Negative.   Musculoskeletal: Negative.  Negative for arthralgias and myalgias.  Skin: Negative.   Neurological: Negative.  Negative for dizziness, weakness, light-headedness and headaches.  Hematological:  Negative for adenopathy. Does not bruise/bleed easily.  Psychiatric/Behavioral: Negative.      Objective:  BP 118/72 (BP Location: Right Arm, Patient Position: Sitting, Cuff Size: Large)   Pulse 95   Temp 98.2 F (36.8 C) (Oral)   Resp 16   Ht 5\' 7"  (1.702 m)   Wt 223 lb (101.2 kg)   LMP 06/15/2022 (Exact Date)  SpO2 98%   BMI 34.93 kg/m   BP Readings from Last 3 Encounters:  06/29/22 118/72  05/29/22 130/80  02/05/22 126/82    Wt Readings from Last 3 Encounters:  06/29/22 223 lb (101.2 kg)  05/29/22 234 lb 6.4 oz (106.3 kg)  02/05/22 246 lb (111.6 kg)    Physical Exam Vitals reviewed.  Constitutional:      Appearance: She is not ill-appearing.  HENT:     Right Ear: Hearing, tympanic membrane, ear canal and external ear normal. No middle ear effusion.     Left Ear: Tympanic membrane, ear canal and external ear  normal.  No middle ear effusion.     Nose: Mucosal edema and congestion present. No rhinorrhea.     Right Nostril: No epistaxis.     Left Nostril: No epistaxis.     Right Sinus: No maxillary sinus tenderness or frontal sinus tenderness.     Left Sinus: Maxillary sinus tenderness present. No frontal sinus tenderness.     Mouth/Throat:     Mouth: Mucous membranes are moist.  Eyes:     General: No scleral icterus.    Conjunctiva/sclera: Conjunctivae normal.  Cardiovascular:     Rate and Rhythm: Normal rate and regular rhythm.     Heart sounds: No murmur heard. Pulmonary:     Effort: Pulmonary effort is normal.     Breath sounds: No stridor. No wheezing, rhonchi or rales.  Abdominal:     General: Abdomen is flat.     Palpations: There is no mass.     Tenderness: There is no abdominal tenderness. There is no guarding.     Hernia: No hernia is present.  Musculoskeletal:        General: Normal range of motion.     Cervical back: Neck supple.     Right lower leg: No edema.     Left lower leg: No edema.  Lymphadenopathy:     Cervical: No cervical adenopathy.  Skin:    General: Skin is warm and dry.  Neurological:     General: No focal deficit present.     Mental Status: She is alert. Mental status is at baseline.  Psychiatric:        Mood and Affect: Mood normal.        Behavior: Behavior normal.     Lab Results  Component Value Date   WBC 11.0 (H) 02/05/2022   HGB 13.4 02/05/2022   HCT 40.3 02/05/2022   PLT 306.0 02/05/2022   GLUCOSE 77 02/05/2022   CHOL 139 02/05/2022   TRIG 102.0 02/05/2022   HDL 39.80 02/05/2022   LDLCALC 79 02/05/2022   ALT 64 (H) 02/05/2022   AST 36 02/05/2022   NA 137 02/05/2022   K 4.3 02/05/2022   CL 103 02/05/2022   CREATININE 0.84 02/05/2022   BUN 11 02/05/2022   CO2 27 02/05/2022   TSH 2.06 02/05/2022   HGBA1C 5.3 02/05/2022    MR CERVICAL SPINE WO CONTRAST  Result Date: 09/11/2021 CLINICAL DATA:  Initial evaluation for chronic neck  pain, cervicalgia. EXAM: MRI CERVICAL SPINE WITHOUT CONTRAST TECHNIQUE: Multiplanar, multisequence MR imaging of the cervical spine was performed. No intravenous contrast was administered. COMPARISON:  None available. FINDINGS: Alignment: Straightening with slight reversal of the normal cervical lordosis. No significant listhesis. Vertebrae: Vertebral body height maintained without acute or chronic fracture. Bone marrow signal intensity within normal limits. No discrete or worrisome osseous lesions. No abnormal marrow edema. Cord: Focal prominence of the central  canal versus tiny syrinx seen at the level of C6-7 (series 5, image 31). This measures up to 2 mm in maximal diameter. Signal intensity within the visualized cord is otherwise within normal limits. Posterior Fossa, vertebral arteries, paraspinal tissues: Unremarkable. Disc levels: C2-C3: Unremarkable. C3-C4: Minimal disc bulge. No spinal stenosis. Foramina remain patent. C4-C5: Minimal disc bulge with endplate spurring. No significant spinal stenosis. Remain patent. C5-C6: Minimal disc bulge with superimposed tiny central disc protrusion. No significant spinal stenosis. Foramina remain patent. C6-C7: Degenerative intervertebral disc space narrowing with diffuse disc osteophyte complex. Flattening and partial effacement of the ventral thecal sac with resultant mild spinal stenosis. Secondary mild cord flattening. Mild left C7 foraminal stenosis. Right neural foramen remains patent. C7-T1:  Seen only on sagittal projection.  Unremarkable. Visualized upper thoracic spine demonstrates no significant finding. IMPRESSION: 1. 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 2. Additional mild noncompressive disc bulging at C3-4 through C5-6 without significant stenosis or neural impingement. 3. Focal prominence of the central canal versus tiny syrinx at the level of C6-7. Electronically Signed   By: Rise Mu  M.D.   On: 09/11/2021 17:18    Assessment & Plan:   Acute non-recurrent maxillary sinusitis- Will treat with a 10-day course of Augmentin. -     Amoxicillin-Pot Clavulanate; Take 1 tablet by mouth 2 (two) times daily for 10 days.  Dispense: 20 tablet; Refill: 0  Seasonal allergic rhinitis due to pollen -     methylPREDNISolone; TAKE AS DIRECTED  Dispense: 21 tablet; Refill: 0     Follow-up: Return if symptoms worsen or fail to improve.  Sanda Linger, MD

## 2022-06-30 ENCOUNTER — Encounter: Payer: Self-pay | Admitting: Internal Medicine

## 2022-07-06 ENCOUNTER — Other Ambulatory Visit: Payer: Self-pay | Admitting: Internal Medicine

## 2022-07-07 ENCOUNTER — Other Ambulatory Visit: Payer: Self-pay | Admitting: Internal Medicine

## 2022-07-07 ENCOUNTER — Encounter: Payer: Self-pay | Admitting: Internal Medicine

## 2022-07-07 MED ORDER — TIRZEPATIDE 5 MG/0.5ML ~~LOC~~ SOAJ
5.0000 mg | SUBCUTANEOUS | 0 refills | Status: DC
Start: 2022-07-07 — End: 2022-08-27

## 2022-07-07 MED ORDER — ZEPBOUND 5 MG/0.5ML ~~LOC~~ SOAJ
5.0000 mg | SUBCUTANEOUS | 0 refills | Status: DC
Start: 2022-07-07 — End: 2022-07-07

## 2022-07-25 ENCOUNTER — Other Ambulatory Visit: Payer: Self-pay | Admitting: Neurology

## 2022-08-06 ENCOUNTER — Ambulatory Visit: Payer: No Typology Code available for payment source | Admitting: Internal Medicine

## 2022-08-27 ENCOUNTER — Ambulatory Visit: Payer: No Typology Code available for payment source | Admitting: Internal Medicine

## 2022-08-27 ENCOUNTER — Encounter: Payer: Self-pay | Admitting: Internal Medicine

## 2022-08-27 ENCOUNTER — Ambulatory Visit: Payer: No Typology Code available for payment source

## 2022-08-27 VITALS — BP 122/78 | HR 95 | Temp 98.1°F | Resp 16 | Ht 67.0 in | Wt 198.0 lb

## 2022-08-27 DIAGNOSIS — R7989 Other specified abnormal findings of blood chemistry: Secondary | ICD-10-CM | POA: Diagnosis not present

## 2022-08-27 DIAGNOSIS — K5909 Other constipation: Secondary | ICD-10-CM | POA: Insufficient documentation

## 2022-08-27 DIAGNOSIS — R634 Abnormal weight loss: Secondary | ICD-10-CM | POA: Diagnosis not present

## 2022-08-27 DIAGNOSIS — R1084 Generalized abdominal pain: Secondary | ICD-10-CM

## 2022-08-27 DIAGNOSIS — E876 Hypokalemia: Secondary | ICD-10-CM | POA: Insufficient documentation

## 2022-08-27 LAB — HEPATIC FUNCTION PANEL
ALT: 220 U/L — ABNORMAL HIGH (ref 0–35)
AST: 114 U/L — ABNORMAL HIGH (ref 0–37)
Albumin: 4.4 g/dL (ref 3.5–5.2)
Alkaline Phosphatase: 45 U/L (ref 39–117)
Bilirubin, Direct: 0.3 mg/dL (ref 0.0–0.3)
Total Bilirubin: 0.8 mg/dL (ref 0.2–1.2)
Total Protein: 7.6 g/dL (ref 6.0–8.3)

## 2022-08-27 LAB — CBC WITH DIFFERENTIAL/PLATELET
Basophils Absolute: 0 10*3/uL (ref 0.0–0.1)
Basophils Relative: 0.5 % (ref 0.0–3.0)
Eosinophils Absolute: 0.1 10*3/uL (ref 0.0–0.7)
Eosinophils Relative: 1.2 % (ref 0.0–5.0)
HCT: 39.3 % (ref 36.0–46.0)
Hemoglobin: 13 g/dL (ref 12.0–15.0)
Lymphocytes Relative: 31.8 % (ref 12.0–46.0)
Lymphs Abs: 2.9 10*3/uL (ref 0.7–4.0)
MCHC: 33.1 g/dL (ref 30.0–36.0)
MCV: 97.1 fl (ref 78.0–100.0)
Monocytes Absolute: 1.1 10*3/uL — ABNORMAL HIGH (ref 0.1–1.0)
Monocytes Relative: 12 % (ref 3.0–12.0)
Neutro Abs: 4.9 10*3/uL (ref 1.4–7.7)
Neutrophils Relative %: 54.5 % (ref 43.0–77.0)
Platelets: 288 10*3/uL (ref 150.0–400.0)
RBC: 4.05 Mil/uL (ref 3.87–5.11)
RDW: 13 % (ref 11.5–15.5)
WBC: 9.1 10*3/uL (ref 4.0–10.5)

## 2022-08-27 LAB — MAGNESIUM: Magnesium: 1.9 mg/dL (ref 1.5–2.5)

## 2022-08-27 LAB — BASIC METABOLIC PANEL
BUN: 12 mg/dL (ref 6–23)
CO2: 28 mEq/L (ref 19–32)
Calcium: 9.6 mg/dL (ref 8.4–10.5)
Chloride: 100 mEq/L (ref 96–112)
Creatinine, Ser: 0.85 mg/dL (ref 0.40–1.20)
GFR: 89.51 mL/min (ref >60.00)
Glucose, Bld: 79 mg/dL (ref 70–99)
Potassium: 3.3 mEq/L — ABNORMAL LOW (ref 3.5–5.1)
Sodium: 135 mEq/L (ref 135–145)

## 2022-08-27 LAB — AMYLASE: Amylase: 26 U/L — ABNORMAL LOW (ref 27–131)

## 2022-08-27 LAB — HCG, QUANTITATIVE, PREGNANCY: Quantitative HCG: <0.6 m[IU]/mL

## 2022-08-27 LAB — TSH: TSH: 1.88 u[IU]/mL (ref 0.35–5.50)

## 2022-08-27 LAB — LIPASE: Lipase: 50 U/L (ref 11.0–59.0)

## 2022-08-27 MED ORDER — POTASSIUM CHLORIDE ER 10 MEQ PO TBCR
10.0000 meq | EXTENDED_RELEASE_TABLET | Freq: Two times a day (BID) | ORAL | 0 refills | Status: DC
Start: 2022-08-27 — End: 2022-11-09

## 2022-08-27 NOTE — Progress Notes (Signed)
Subjective:  Patient ID: Erin Harris, female    DOB: 01-03-1991  Age: 32 y.o. MRN: [id number]  CC: Abdominal Pain   HPI Erin Harris presents for f/up ----  Discussed the use of AI scribe software for clinical note transcription with the patient, who gave verbal consent to proceed.  History of Present Illness   The patient, with a history of obesity and IBS, presents with concerns about the side effects of Mounjaro, a weight loss medication started three and a half months ago. Since initiation, she has lost approximately fifty pounds, but report feeling 'off,' with symptoms of generalized weakness, fatigue, occasional dizziness, and low energy. She also report a significant decrease in appetite, often consuming only a few crackers and pieces of cheese in a day, and sometimes forcing themselves to eat despite lack of desire. She also experience occasional nausea and generalized abdominal discomfort, particularly after eating. The patient's bowel habits have shifted from IBS-D to IBS-C, a change she describe as troublesome.  In addition to these concerns, she reports a persistent issue with her Eustachian tube, which she describe as a sensation of needing to 'pop,' but without associated hearing loss. This issue began after a painful experience of pressure equalization following a flight several months ago and has persisted since, particularly noticeable during episodes of upper respiratory infection. However, she deny any concurrent nasal symptoms.  Regarding her menstrual cycle, while on birth control, she report minimal spotting, with a recent episode within the last month or two. She deny any abnormalities such as prolonged or heavy bleeding.       Outpatient Medications Prior to Visit  Medication Sig Dispense Refill   Atogepant (QULIPTA) 60 MG TABS Take 60 mg by mouth daily. 30 tablet 11   buPROPion (WELLBUTRIN XL) 300 MG 24 hr tablet Take 1 tablet (300 mg total) by mouth daily. 90 tablet  1   CRYSELLE-28 0.3-30 MG-MCG tablet TAKE 1 TABLET BY MOUTH EVERY DAY 84 tablet 1   dicyclomine (BENTYL) 10 MG capsule TAKE 1 CAPSULE (10 MG TOTAL) BY MOUTH 3 (THREE) TIMES DAILY BEFORE MEALS. (Patient taking differently: Take 10 mg by mouth 3 (three) times daily before meals. PRN) 270 capsule 0   ondansetron (ZOFRAN-ODT) 4 MG disintegrating tablet Take 1 tablet (4 mg total) by mouth every 8 (eight) hours as needed for nausea or vomiting. 20 tablet 11   propranolol ER (INDERAL LA) 60 MG 24 hr capsule TAKE 1 CAPSULE BY MOUTH EVERY DAY 90 capsule 0   Rimegepant Sulfate (NURTEC) 75 MG TBDP Take 1 tablet (75 mg total) by mouth daily as needed. 8 tablet 11   rizatriptan (MAXALT-MLT) 10 MG disintegrating tablet Take 1 tablet earliest onset of migraine.  May repeat in 2 hours if needed.  Maximum 2 tablets in 24 hours. 10 tablet 5   traZODone (DESYREL) 50 MG tablet Take 1 tablet (50 mg total) by mouth at bedtime. 90 tablet 1   venlafaxine XR (EFFEXOR-XR) 150 MG 24 hr capsule Take 1 capsule (150 mg total) by mouth daily. (Patient taking differently: Take 150 mg by mouth daily. 2 tab to total 300 mg) 90 capsule 1   tirzepatide (MOUNJARO) 5 MG/0.5ML Pen Inject 5 mg into the skin once a week. 6 mL 0   No facility-administered medications prior to visit.    ROS Review of Systems  Constitutional:  Positive for appetite change, fatigue and unexpected weight change. Negative for chills and diaphoresis.  HENT: Negative.  Negative for trouble swallowing.  Eyes:  Negative for visual disturbance.  Respiratory:  Negative for cough, chest tightness, shortness of breath and wheezing.   Cardiovascular:  Negative for chest pain, palpitations and leg swelling.  Gastrointestinal:  Positive for abdominal pain, constipation and nausea. Negative for diarrhea.  Genitourinary: Negative.  Negative for difficulty urinating, dysuria and flank pain.  Musculoskeletal: Negative.  Negative for arthralgias and myalgias.  Skin:  Negative.  Negative for color change.  Neurological:  Positive for dizziness and weakness. Negative for light-headedness.  Hematological:  Negative for adenopathy. Does not bruise/bleed easily.  Psychiatric/Behavioral: Negative.      Objective:  BP 122/78 (BP Location: Right Arm, Patient Position: Sitting, Cuff Size: Large)   Pulse 95   Temp 98.1 F (36.7 C) (Oral)   Resp 16   Ht 5\' 7"  (1.702 m)   Wt 198 lb (89.8 kg)   LMP 06/28/2022 (Approximate)   SpO2 98%   BMI 31.01 kg/m   BP Readings from Last 3 Encounters:  08/27/22 122/78  06/29/22 118/72  05/29/22 130/80    Wt Readings from Last 3 Encounters:  08/27/22 198 lb (89.8 kg)  06/29/22 223 lb (101.2 kg)  05/29/22 234 lb 6.4 oz (106.3 kg)    Physical Exam Vitals reviewed.  HENT:     Nose: Nose normal.     Mouth/Throat:     Mouth: Mucous membranes are moist.  Eyes:     General: No scleral icterus.    Conjunctiva/sclera: Conjunctivae normal.  Cardiovascular:     Rate and Rhythm: Normal rate and regular rhythm.     Heart sounds: No murmur heard. Pulmonary:     Effort: Pulmonary effort is normal.     Breath sounds: No stridor. No wheezing, rhonchi or rales.  Abdominal:     General: Abdomen is flat. Bowel sounds are decreased. There is no distension.     Palpations: Abdomen is soft. There is no fluid wave, hepatomegaly, splenomegaly or mass.     Tenderness: There is no abdominal tenderness. There is no guarding.     Hernia: No hernia is present.  Musculoskeletal:        General: Normal range of motion.     Cervical back: Neck supple.     Right lower leg: No edema.     Left lower leg: No edema.  Lymphadenopathy:     Cervical: No cervical adenopathy.  Skin:    General: Skin is warm and dry.  Neurological:     General: No focal deficit present.     Mental Status: She is alert. Mental status is at baseline.  Psychiatric:        Mood and Affect: Mood normal.        Behavior: Behavior normal.     Lab Results   Component Value Date   WBC 9.1 08/27/2022   HGB 13.0 08/27/2022   HCT 39.3 08/27/2022   PLT 288.0 08/27/2022   GLUCOSE 79 08/27/2022   CHOL 139 02/05/2022   TRIG 102.0 02/05/2022   HDL 39.80 02/05/2022   LDLCALC 79 02/05/2022   ALT 220 (H) 08/27/2022   AST 114 (H) 08/27/2022   NA 135 08/27/2022   K 3.3 (L) 08/27/2022   CL 100 08/27/2022   CREATININE 0.85 08/27/2022   BUN 12 08/27/2022   CO2 28 08/27/2022   TSH 1.88 08/27/2022   INR 1.2 (H) 08/28/2022   HGBA1C 5.3 02/05/2022    MR CERVICAL SPINE WO CONTRAST  Result Date: 09/11/2021 CLINICAL DATA:  Initial evaluation for  chronic neck pain, cervicalgia. EXAM: MRI CERVICAL SPINE WITHOUT CONTRAST TECHNIQUE: Multiplanar, multisequence MR imaging of the cervical spine was performed. No intravenous contrast was administered. COMPARISON:  None available. FINDINGS: Alignment: Straightening with slight reversal of the normal cervical lordosis. No significant listhesis. Vertebrae: Vertebral body height maintained without acute or chronic fracture. Bone marrow signal intensity within normal limits. No discrete or worrisome osseous lesions. No abnormal marrow edema. Cord: Focal prominence of the central canal versus tiny syrinx seen at the level of C6-7 (series 5, image 31). This measures up to 2 mm in maximal diameter. Signal intensity within the visualized cord is otherwise within normal limits. Posterior Fossa, vertebral arteries, paraspinal tissues: Unremarkable. Disc levels: C2-C3: Unremarkable. C3-C4: Minimal disc bulge. No spinal stenosis. Foramina remain patent. C4-C5: Minimal disc bulge with endplate spurring. No significant spinal stenosis. Remain patent. C5-C6: Minimal disc bulge with superimposed tiny central disc protrusion. No significant spinal stenosis. Foramina remain patent. C6-C7: Degenerative intervertebral disc space narrowing with diffuse disc osteophyte complex. Flattening and partial effacement of the ventral thecal sac with  resultant mild spinal stenosis. Secondary mild cord flattening. Mild left C7 foraminal stenosis. Right neural foramen remains patent. C7-T1:  Seen only on sagittal projection.  Unremarkable. Visualized upper thoracic spine demonstrates no significant finding. IMPRESSION: 1. 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 2. Additional mild noncompressive disc bulging at C3-4 through C5-6 without significant stenosis or neural impingement. 3. Focal prominence of the central canal versus tiny syrinx at the level of C6-7. Electronically Signed   By: Rise Mu M.D.   On: 09/11/2021 17:18   No results found.   Assessment & Plan:   Generalized abdominal pain- The workup is unremarkable with the exception of mildly elevated LFT's which she has had previously. I am concerned she is having side effects from [facility] so agreed to discontinue it. -     Lipase; Future -     Amylase; Future -     CBC with Differential/Platelet; Future -     hCG, quantitative, pregnancy; Future -     Hepatic function panel; Future -     DG ABD ACUTE 2+V W 1V CHEST; Future  Weight loss, abnormal -     Lipase; Future -     Amylase; Future -     CBC with Differential/Platelet; Future -     hCG, quantitative, pregnancy; Future -     TSH; Future -     Hepatic function panel; Future -     DG ABD ACUTE 2+V W 1V CHEST; Future  Other constipation -     Basic metabolic panel; Future -     CBC with Differential/Platelet; Future -     Magnesium; Future -     TSH; Future -     Hepatic function panel; Future -     DG ABD ACUTE 2+V W 1V CHEST; Future  Elevated LFTs- Testing for viral hepatitis was negative.  I recommended that she get vaccinated against hepatitis A.  Testing for other forms of liver disease was negative. -     Protime-INR; Future -     Hepatitis B surface antibody,quantitative; Future -     Hepatitis B surface antigen; Future -     Hepatitis B core  antibody, total; Future -     Hepatitis A antibody, total; Future -     ANA; Future -     AntiMicrosomal Ab-Liver / Kidney; Future -  Anti-smooth muscle antibody, IgG; Future -     Mitochondrial/smooth muscle ab pnl; Future  Chronic hypokalemia -     Potassium Chloride ER; Take 1 tablet (10 mEq total) by mouth 2 (two) times daily.  Dispense: 180 tablet; Refill: 0     Follow-up: Return in about 3 months (around 11/27/2022).  Sanda Linger, MD

## 2022-08-27 NOTE — Patient Instructions (Signed)
Abdominal Pain, Adult  Pain in the abdomen (abdominal pain) can be caused by many things. In most cases, it gets better with no treatment or by being treated at home. But in some cases, it can be serious. Your health care provider will ask questions about your medical history and do a physical exam to try to figure out what is causing your pain. Follow these instructions at home: Medicines Take over-the-counter and prescription medicines only as told by your provider. Do not take medicines that help you poop (laxatives) unless told by your provider. General instructions Watch your condition for any changes. Drink enough fluid to keep your pee (urine) pale yellow. Contact a health care provider if: Your pain changes, gets worse, or lasts longer than expected. You have severe cramping or bloating in your abdomen, or you vomit. Your pain gets worse with meals, after eating, or with certain foods. You are constipated or have diarrhea for more than 2-3 days. You are not hungry, or you lose weight without trying. You have signs of dehydration. These may include: Dark pee, very little pee, or no pee. Cracked lips or dry mouth. Sleepiness or weakness. You have pain when you pee (urinate) or poop. Your abdominal pain wakes you up at night. You have blood in your pee. You have a fever. Get help right away if: You cannot stop vomiting. Your pain is only in one part of the abdomen. Pain on the right side could be caused by appendicitis. You have bloody or black poop (stool), or poop that looks like tar. You have trouble breathing. You have chest pain. These symptoms may be an emergency. Get help right away. Call 911. Do not wait to see if the symptoms will go away. Do not drive yourself to the hospital. This information is not intended to replace advice given to you by your health care provider. Make sure you discuss any questions you have with your health care provider. Document Revised:  10/29/2021 Document Reviewed: 10/29/2021 Elsevier Patient Education  2024 [employer]  

## 2022-08-28 ENCOUNTER — Other Ambulatory Visit (INDEPENDENT_AMBULATORY_CARE_PROVIDER_SITE_OTHER): Payer: No Typology Code available for payment source

## 2022-08-28 DIAGNOSIS — R7989 Other specified abnormal findings of blood chemistry: Secondary | ICD-10-CM | POA: Diagnosis not present

## 2022-08-28 LAB — PROTIME-INR
INR: 1.2 ratio — ABNORMAL HIGH (ref 0.8–1.0)
Prothrombin Time: 12.7 s (ref 9.6–13.1)

## 2022-09-13 ENCOUNTER — Other Ambulatory Visit: Payer: Self-pay | Admitting: Internal Medicine

## 2022-09-13 DIAGNOSIS — R000 Tachycardia, unspecified: Secondary | ICD-10-CM

## 2022-09-13 DIAGNOSIS — G43009 Migraine without aura, not intractable, without status migrainosus: Secondary | ICD-10-CM

## 2022-11-09 ENCOUNTER — Other Ambulatory Visit: Payer: Self-pay | Admitting: Neurology

## 2022-11-09 ENCOUNTER — Ambulatory Visit: Payer: No Typology Code available for payment source | Admitting: Internal Medicine

## 2022-11-09 ENCOUNTER — Encounter: Payer: Self-pay | Admitting: Internal Medicine

## 2022-11-09 VITALS — BP 124/72 | HR 100 | Temp 98.0°F | Resp 16 | Ht 67.0 in | Wt 191.0 lb

## 2022-11-09 DIAGNOSIS — E66812 Obesity, class 2: Secondary | ICD-10-CM | POA: Diagnosis not present

## 2022-11-09 DIAGNOSIS — E876 Hypokalemia: Secondary | ICD-10-CM

## 2022-11-09 DIAGNOSIS — K7581 Nonalcoholic steatohepatitis (NASH): Secondary | ICD-10-CM

## 2022-11-09 DIAGNOSIS — Z6839 Body mass index (BMI) 39.0-39.9, adult: Secondary | ICD-10-CM

## 2022-11-09 LAB — BASIC METABOLIC PANEL
BUN: 7 mg/dL (ref 6–23)
CO2: 29 meq/L (ref 19–32)
Calcium: 9.2 mg/dL (ref 8.4–10.5)
Chloride: 103 meq/L (ref 96–112)
Creatinine, Ser: 0.67 mg/dL (ref 0.40–1.20)
GFR: 114.03 mL/min (ref >60.00)
Glucose, Bld: 84 mg/dL (ref 70–99)
Potassium: 3.5 meq/L (ref 3.5–5.1)
Sodium: 138 meq/L (ref 135–145)

## 2022-11-09 LAB — HEPATIC FUNCTION PANEL
ALT: 43 U/L — ABNORMAL HIGH (ref 0–35)
AST: 26 U/L (ref 0–37)
Albumin: 4 g/dL (ref 3.5–5.2)
Alkaline Phosphatase: 53 U/L (ref 39–117)
Bilirubin, Direct: 0 mg/dL (ref 0.0–0.3)
Total Bilirubin: 0.3 mg/dL (ref 0.2–1.2)
Total Protein: 7.2 g/dL (ref 6.0–8.3)

## 2022-11-09 NOTE — Progress Notes (Unsigned)
Subjective:  Patient ID: Erin Harris, female    DOB: 10/21/1990  Age: 32 y.o. MRN: [id number]  CC: No chief complaint on file.   HPI Erin Harris presents for ***  Outpatient Medications Prior to Visit  Medication Sig Dispense Refill   Atogepant (QULIPTA) 60 MG TABS Take 60 mg by mouth daily. 30 tablet 11   buPROPion (WELLBUTRIN XL) 300 MG 24 hr tablet Take 1 tablet (300 mg total) by mouth daily. 90 tablet 1   CRYSELLE-28 0.3-30 MG-MCG tablet TAKE 1 TABLET BY MOUTH EVERY DAY 84 tablet 1   ondansetron (ZOFRAN-ODT) 4 MG disintegrating tablet Take 1 tablet (4 mg total) by mouth every 8 (eight) hours as needed for nausea or vomiting. 20 tablet 11   propranolol ER (INDERAL LA) 60 MG 24 hr capsule TAKE 1 CAPSULE BY MOUTH EVERY DAY 90 capsule 0   Rimegepant Sulfate (NURTEC) 75 MG TBDP Take 1 tablet (75 mg total) by mouth daily as needed. 8 tablet 11   rizatriptan (MAXALT-MLT) 10 MG disintegrating tablet Take 1 tablet earliest onset of migraine.  May repeat in 2 hours if needed.  Maximum 2 tablets in 24 hours. 10 tablet 5   traZODone (DESYREL) 50 MG tablet Take 1 tablet (50 mg total) by mouth at bedtime. 90 tablet 1   venlafaxine XR (EFFEXOR-XR) 150 MG 24 hr capsule Take 1 capsule (150 mg total) by mouth daily. (Patient taking differently: Take 150 mg by mouth daily. 2 tab to total 300 mg) 90 capsule 1   dicyclomine (BENTYL) 10 MG capsule TAKE 1 CAPSULE (10 MG TOTAL) BY MOUTH 3 (THREE) TIMES DAILY BEFORE MEALS. (Patient taking differently: Take 10 mg by mouth 3 (three) times daily before meals. PRN) 270 capsule 0   potassium chloride (KLOR-CON 10) 10 MEQ tablet Take 1 tablet (10 mEq total) by mouth 2 (two) times daily. 180 tablet 0   No facility-administered medications prior to visit.    ROS Review of Systems  Objective:  BP 124/72 (BP Location: Left Arm, Patient Position: Sitting, Cuff Size: Large)   Pulse 100   Temp 98 F (36.7 C) (Oral)   Ht 5\' 7"  (1.702 m)   Wt 191 lb (86.6 kg)    SpO2 98%   BMI 29.91 kg/m   BP Readings from Last 3 Encounters:  11/09/22 124/72  08/27/22 122/78  06/29/22 118/72    Wt Readings from Last 3 Encounters:  11/09/22 191 lb (86.6 kg)  08/27/22 198 lb (89.8 kg)  06/29/22 223 lb (101.2 kg)    Physical Exam  Lab Results  Component Value Date   WBC 9.1 08/27/2022   HGB 13.0 08/27/2022   HCT 39.3 08/27/2022   PLT 288.0 08/27/2022   GLUCOSE 79 08/27/2022   CHOL 139 02/05/2022   TRIG 102.0 02/05/2022   HDL 39.80 02/05/2022   LDLCALC 79 02/05/2022   ALT 220 (H) 08/27/2022   AST 114 (H) 08/27/2022   NA 135 08/27/2022   K 3.3 (L) 08/27/2022   CL 100 08/27/2022   CREATININE 0.85 08/27/2022   BUN 12 08/27/2022   CO2 28 08/27/2022   TSH 1.88 08/27/2022   INR 1.2 (H) 08/28/2022   HGBA1C 5.3 02/05/2022    MR CERVICAL SPINE WO CONTRAST  Result Date: 09/11/2021 CLINICAL DATA:  Initial evaluation for chronic neck pain, cervicalgia. EXAM: MRI CERVICAL SPINE WITHOUT CONTRAST TECHNIQUE: Multiplanar, multisequence MR imaging of the cervical spine was performed. No intravenous contrast was administered. COMPARISON:  None available. FINDINGS: Alignment:  Straightening with slight reversal of the normal cervical lordosis. No significant listhesis. Vertebrae: Vertebral body height maintained without acute or chronic fracture. Bone marrow signal intensity within normal limits. No discrete or worrisome osseous lesions. No abnormal marrow edema. Cord: Focal prominence of the central canal versus tiny syrinx seen at the level of C6-7 (series 5, image 31). This measures up to 2 mm in maximal diameter. Signal intensity within the visualized cord is otherwise within normal limits. Posterior Fossa, vertebral arteries, paraspinal tissues: Unremarkable. Disc levels: C2-C3: Unremarkable. C3-C4: Minimal disc bulge. No spinal stenosis. Foramina remain patent. C4-C5: Minimal disc bulge with endplate spurring. No significant spinal stenosis. Remain patent. C5-C6:  Minimal disc bulge with superimposed tiny central disc protrusion. No significant spinal stenosis. Foramina remain patent. C6-C7: Degenerative intervertebral disc space narrowing with diffuse disc osteophyte complex. Flattening and partial effacement of the ventral thecal sac with resultant mild spinal stenosis. Secondary mild cord flattening. Mild left C7 foraminal stenosis. Right neural foramen remains patent. C7-T1:  Seen only on sagittal projection.  Unremarkable. Visualized upper thoracic spine demonstrates no significant finding. IMPRESSION: 1. 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 2. Additional mild noncompressive disc bulging at C3-4 through C5-6 without significant stenosis or neural impingement. 3. Focal prominence of the central canal versus tiny syrinx at the level of C6-7. Electronically Signed   By: Rise Mu M.D.   On: 09/11/2021 17:18    Assessment & Plan:  There are no diagnoses linked to this encounter.   Follow-up: No follow-ups on file.  Sanda Linger, MD

## 2022-11-09 NOTE — Patient Instructions (Signed)
Fatty Liver Disease  The liver converts food into energy, removes toxic material from the blood, makes important proteins, and absorbs necessary vitamins from food. Fatty liver disease occurs when too much fat has built up in your liver cells. Fatty liver disease is also called hepatic steatosis. In many cases, fatty liver disease does not cause symptoms or problems. It is often diagnosed when tests are being done for other reasons. However, over time, fatty liver can cause inflammation that may lead to more serious liver problems, such as scarring of the liver (cirrhosis) and liver failure. Fatty liver is associated with insulin resistance, increased body fat, high blood pressure (hypertension), and high cholesterol. These are features of metabolic syndrome and increase your risk for stroke, diabetes, and heart disease. What are the causes? This condition may be caused by components of metabolic syndrome: Obesity. Insulin resistance. High cholesterol. Other causes: Alcohol abuse. Poor nutrition. Cushing syndrome. Pregnancy. Certain drugs. Poisons. Some viral infections. What increases the risk? You are more likely to develop this condition if you: Abuse alcohol. Are overweight. Have diabetes. Have hepatitis. Have a high triglyceride level. Are pregnant. What are the signs or symptoms? Fatty liver disease often does not cause symptoms. If symptoms do develop, they can include: Fatigue and weakness. Weight loss. Confusion. Nausea, vomiting, or abdominal pain. Yellowing of your skin and the white parts of your eyes (jaundice). Itchy skin. How is this diagnosed? This condition may be diagnosed by: A physical exam and your medical history. Blood tests. Imaging tests, such as an ultrasound, CT scan, or MRI. A liver biopsy. A small sample of liver tissue is removed using a needle. The sample is then looked at under a microscope. How is this treated? Fatty liver disease is often  caused by other health conditions. Treatment for fatty liver may involve medicines and lifestyle changes to manage conditions such as: Alcoholism. High cholesterol. Diabetes. Being overweight or obese. Follow these instructions at home:  Do not drink alcohol. If you have trouble quitting, ask your health care provider how to safely quit with the help of medicine or a supervised program. This is important to keep your condition from getting worse. Eat a healthy diet as told by your health care provider. Ask your health care provider about working with a dietitian to develop an eating plan. Exercise regularly. This can help you lose weight and control your cholesterol and diabetes. Talk to your health care provider about an exercise plan and which activities are best for you. Take over-the-counter and prescription medicines only as told by your health care provider. Keep all follow-up visits. This is important. Contact a health care provider if: You have trouble controlling your: Blood sugar. This is especially important if you have diabetes. Cholesterol. Drinking of alcohol. Get help right away if: You have abdominal pain. You have jaundice. You have nausea and are vomiting. You vomit blood or material that looks like coffee grounds. You have stools that are black, tar-like, or bloody. Summary Fatty liver disease develops when too much fat builds up in the cells of your liver. Fatty liver disease often causes no symptoms or problems. However, over time, fatty liver can cause inflammation that may lead to more serious liver problems, such as scarring of the liver (cirrhosis). You are more likely to develop this condition if you abuse alcohol, are pregnant, are overweight, have diabetes, have hepatitis, or have high triglyceride or cholesterol levels. Contact your health care provider if you have trouble controlling your blood  sugar, cholesterol, or drinking of alcohol. This information is  not intended to replace advice given to you by your health care provider. Make sure you discuss any questions you have with your health care provider. Document Revised: 10/26/2019 Document Reviewed: 10/26/2019 Elsevier Patient Education  2024 [employer].

## 2022-11-10 ENCOUNTER — Encounter: Payer: Self-pay | Admitting: Internal Medicine

## 2022-11-10 MED ORDER — ZEPBOUND 5 MG/0.5ML ~~LOC~~ SOAJ
5.0000 mg | SUBCUTANEOUS | 0 refills | Status: DC
Start: 2022-11-10 — End: 2023-02-24

## 2022-11-12 ENCOUNTER — Other Ambulatory Visit (HOSPITAL_COMMUNITY): Payer: Self-pay

## 2022-11-12 NOTE — Telephone Encounter (Signed)
I am not showing active prescription coverage for this patient, and the card scanned in says coverage is termed. Will need insurance information or new card scanned in to chart to submit PA request

## 2022-11-13 ENCOUNTER — Encounter: Payer: Self-pay | Admitting: Internal Medicine

## 2022-11-13 ENCOUNTER — Encounter: Payer: Self-pay | Admitting: Neurology

## 2022-11-13 ENCOUNTER — Telehealth: Payer: Self-pay

## 2022-11-13 NOTE — Telephone Encounter (Signed)
Per patient, Dr. Everlena Cooper, I tried to get my Qulipta refilled at my pharmacy and they said my insurance is requiring another prior authorization before they will fill it again. I've been on the medicine for awhile but apparently insurance will at times require a new prior authorization once a year so that might be why they need it again. [facility] said they sent some paperwork to your office to fill out to start the process.

## 2022-11-17 ENCOUNTER — Telehealth: Payer: Self-pay | Admitting: Pharmacist

## 2022-11-17 NOTE — Telephone Encounter (Signed)
Pharmacy Patient Advocate Encounter   Received notification from CoverMyMeds that prior authorization for Zepbound 5MG /0.5ML pen-injectors is required/requested.   Insurance verification completed.   The patient is insured through  [facility]  .   Per test claim: PA required; PA submitted to [facility] via CoverMyMeds Key/confirmation #/EOC [employer] Status is pending

## 2022-11-18 ENCOUNTER — Other Ambulatory Visit (HOSPITAL_COMMUNITY): Payer: Self-pay

## 2022-11-19 ENCOUNTER — Other Ambulatory Visit (HOSPITAL_COMMUNITY): Payer: Self-pay

## 2022-11-20 NOTE — Telephone Encounter (Signed)
Additional information has been requested from the patient's insurance in order to proceed with the prior authorization request. Requested information has been sent, or form has been filled out and faxed back to [phone]

## 2022-11-23 ENCOUNTER — Other Ambulatory Visit (HOSPITAL_COMMUNITY): Payer: Self-pay

## 2022-11-23 ENCOUNTER — Telehealth: Payer: Self-pay

## 2022-11-23 ENCOUNTER — Encounter: Payer: Self-pay | Admitting: Neurology

## 2022-11-23 NOTE — Telephone Encounter (Signed)
I just spoke with the pharmacy plan portion of my insurance which is through [facility]. Is it possible you are trying to send the prior auth to a different company than [employer]? On my [employer] insurance card it listed the number for Patrick [place] is [phone]. I attached a photo of the card.   Attachments  [email or web address]

## 2022-11-24 ENCOUNTER — Other Ambulatory Visit (HOSPITAL_COMMUNITY): Payer: Self-pay

## 2022-11-25 NOTE — Progress Notes (Signed)
NEUROLOGY FOLLOW UP OFFICE NOTE  Erin Harris [id number]  Assessment/Plan:   Migraine without aura, without status migrainosus, intractable, improved Cervicogenic headache Cervicalgia, myofascial   Neck pain: Start methocarbamol 500mg  as needed. Migraine prevention:  Restart Qulipta 60mg  daily with samples and sort out her coverage with insurance.  Continue propranolol ER 60mg  daily.  Also on venlafaxine XR 225mg  daily for anxiety which also is helpful for migraine.  Migraine rescue:  Nurtec PRN, rizatriptan second line  Limit use of pain relievers to no more than 2 days out of week to prevent risk of rebound or medication-overuse headache. Keep headache diary Follow up 6 months.       Subjective:  Erin Harris is a 32 year old female with PTSD, depression, IBS, tachycardia and migraine who follows up for migraine and neck pain.   UPDATE: Migraines: She has been doing well on [place].  However, she had trouble getting Qulipta filled and has been off of it for 3 weeks.  Headaches are now daily and has had 2-3 migraines.  For cervicogenic headaches and neck pain, baclofen was changed to tizanidine.  She did not like the way it made her feel.    Frequency of abortive medication: rarely Current NSAIDS/analgesics:  none Current triptans:  rizatriptan 10mg  (no longer using) Current ergotamine:  none Current anti-emetic:  Zofran 4mg  Current muscle relaxants: none Current Antihypertensive medications:  propranolol ER 60mg  daily Current Antidepressant medications:  venlafaxine XR 225mg  daily, bupropion XL 300mg  daily Current Anticonvulsant medications:  none Current anti-CGRP:  Qulipta 60mg  daily (not for past 2 weeks), Nurtec (rescue) Current Vitamins/Herbal/Supplements:  none Current Antihistamines/Decongestants:  none Other therapy:  none Hormone/birth control:  none   Caffeine:  None Diet:  Hydrates with water.  No soda.   Exercise:  not routine Depression:  yes; Anxiety:   yes Other pain:  no Sleep hygiene:  okay.  Trazodone helps.     HISTORY:  Onset:  headaches in high school.  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  Location:  base of skull/back of neck radiating to jaw and ears bilaterally Quality:  throbbing, pressure Intensity:  6-7/10.   Aura:  absent.  One time had central blurred vision in left eye lasting 12 hours. Prodrome:  absent Associated symptoms:  Nausea, photophobia, phonophobia, sometimes vomiting.  She denies associated visual disturbance, unilateral numbness or weakness. Duration:  3-4 days (within a few hours with Bernita Raisin) Frequency:  once a week to once every other week (1 every 2 months on Qulipta) Frequency of abortive medication: ibuprofen 1 to 2 days a week Triggers:  stress, poor sleep, poor diet, [place] food, chocolate, hormonal Relieving factors:  resting in quiet dark room Activity:  aggravates In between, keeps a constant low-grade dull frontal/periorbital-maxillary headache   MRI of brain without contrast on 03/09/2020 was normal.  MRI of cervical spine without contrast 09/11/2021 revealed degenerative changes with osteophyte at C6-7 causing mild canal and left C7 foraminal stenosis, mild noncompressive disc bulges at C3-4 through C5-6 and questionable tiny syrinx at level of C6-7.   Past NSAIDS/analgesics:  Excedrin, acetaminophen, ibuprofen Past abortive triptans:  sumatriptan (caused palpitations) Past abortive ergotamine:  none Past muscle relaxants:  Flexeril, baclofen, tizanidine Past anti-emetic:  promethazine Past antihypertensive medications:  metoprolol Past antidepressant medications:  citalopram Past anticonvulsant medications:  none Past anti-CGRP:  Aimovig 140mg , Ubrelvy 100mg  (effective but no longer covered) Past vitamins/Herbal/Supplements:  none Past antihistamines/decongestants:  none Other past therapies:  trigger point injections,  dry needling, PT     Family history of migraines:   mother  PAST MEDICAL HISTORY: Past Medical History:  Diagnosis Date   Allergy    Depression    Frequent headaches    IBS (irritable bowel syndrome)    Migraine    PTSD (post-traumatic stress disorder)    UTI (urinary tract infection)     MEDICATIONS: Current Outpatient Medications on File Prior to Visit  Medication Sig Dispense Refill   Atogepant (QULIPTA) 60 MG TABS Take 60 mg by mouth daily. 30 tablet 11   buPROPion (WELLBUTRIN XL) 300 MG 24 hr tablet Take 1 tablet (300 mg total) by mouth daily. 90 tablet 1   CRYSELLE-28 0.3-30 MG-MCG tablet TAKE 1 TABLET BY MOUTH EVERY DAY 84 tablet 1   ondansetron (ZOFRAN-ODT) 4 MG disintegrating tablet Take 1 tablet (4 mg total) by mouth every 8 (eight) hours as needed for nausea or vomiting. 20 tablet 11   propranolol ER (INDERAL LA) 60 MG 24 hr capsule TAKE 1 CAPSULE BY MOUTH EVERY DAY 90 capsule 0   Rimegepant Sulfate (NURTEC) 75 MG TBDP Take 1 tablet (75 mg total) by mouth daily as needed. 8 tablet 11   rizatriptan (MAXALT-MLT) 10 MG disintegrating tablet Take 1 tablet earliest onset of migraine.  May repeat in 2 hours if needed.  Maximum 2 tablets in 24 hours. 10 tablet 5   tirzepatide (ZEPBOUND) 5 MG/0.5ML Pen Inject 5 mg into the skin once a week. 6 mL 0   traZODone (DESYREL) 50 MG tablet Take 1 tablet (50 mg total) by mouth at bedtime. 90 tablet 1   venlafaxine XR (EFFEXOR-XR) 150 MG 24 hr capsule Take 1 capsule (150 mg total) by mouth daily. (Patient taking differently: Take 150 mg by mouth daily. 2 tab to total 300 mg) 90 capsule 1   No current facility-administered medications on file prior to visit.    ALLERGIES: Allergies  Allergen Reactions   Imitrex [Sumatriptan] Palpitations   Lactose Other (See Comments)    GI Issues   Nickel Rash    FAMILY HISTORY: Family History  Problem Relation Age of Onset   Migraines Mother    Diabetes Father    Hyperlipidemia Father    Hypertension Father    Hearing loss Maternal Grandfather     Heart disease Maternal Grandfather    Early death Paternal Grandmother    Heart disease Paternal Grandmother    Hearing loss Paternal Grandfather    Cancer Paternal Grandfather    Cancer Sister    Birth defects Sister    Heart disease Sister    Alcohol abuse Brother       Objective:  Blood pressure 104/71, pulse 86, height 5\' 6"  (1.676 m), weight 190 lb 12.8 oz (86.5 kg), SpO2 99%. General: No acute distress.  Patient appears well-groomed.     Shon Millet, DO  CC: Sanda Linger, MD

## 2022-11-26 ENCOUNTER — Ambulatory Visit: Payer: No Typology Code available for payment source | Admitting: Internal Medicine

## 2022-11-26 ENCOUNTER — Telehealth: Payer: Self-pay

## 2022-11-26 ENCOUNTER — Other Ambulatory Visit (HOSPITAL_COMMUNITY): Payer: Self-pay

## 2022-11-26 NOTE — Telephone Encounter (Signed)
PA request has been Submitted. New Encounter created for follow up. For additional info see Pharmacy Prior Auth telephone encounter from 10/31.

## 2022-11-26 NOTE — Telephone Encounter (Signed)
*  [facility]  Pharmacy Patient Advocate Encounter   Received notification from Pt Calls Messages that prior authorization for Qulipta 60MG  tablets  is required/requested.   Insurance verification completed.   The patient is insured through [employer] .   Per test claim: PA required; PA submitted to above mentioned insurance via Fax Key/confirmation #/EOC [facility] Status is pending

## 2022-11-27 ENCOUNTER — Other Ambulatory Visit (HOSPITAL_COMMUNITY): Payer: Self-pay

## 2022-11-27 ENCOUNTER — Ambulatory Visit: Payer: No Typology Code available for payment source | Admitting: Neurology

## 2022-11-27 ENCOUNTER — Encounter: Payer: Self-pay | Admitting: Neurology

## 2022-11-27 VITALS — BP 104/71 | HR 86 | Ht 66.0 in | Wt 190.8 lb

## 2022-11-27 DIAGNOSIS — G43009 Migraine without aura, not intractable, without status migrainosus: Secondary | ICD-10-CM

## 2022-11-27 DIAGNOSIS — M542 Cervicalgia: Secondary | ICD-10-CM

## 2022-11-27 MED ORDER — QULIPTA 60 MG PO TABS: 60.0000 mg | ORAL_TABLET | Freq: Every day | ORAL | 11 refills | Status: DC

## 2022-11-27 MED ORDER — METHOCARBAMOL 500 MG PO TABS: 500.0000 mg | ORAL_TABLET | Freq: Four times a day (QID) | ORAL | 5 refills | Status: DC | PRN

## 2022-11-27 NOTE — Patient Instructions (Signed)
Continue Qulipta 60mg  daily For acute therapy:  Nurtec.  Rizatriptan second line.  Zofran for nausea For neck pain, try methocarbamol 500mg  as needed Follow up 6 months.

## 2022-11-27 NOTE — Progress Notes (Signed)
Medication Samples have been provided to the patient.  Drug name: Bennie Pierini       Strength: 60 mg        Qty: 7  LOT: [id number]  Exp.Date: 9/26  Dosing instructions: daily  The patient has been instructed regarding the correct time, dose, and frequency of taking this medication, including desired effects and most common side effects.   Leida Lauth 2:46 PM 11/27/2022

## 2022-11-27 NOTE — Telephone Encounter (Signed)
Pharmacy Patient Advocate Encounter  Received notification from  [facility]  that Prior Authorization for Zepbound 5MG /0.5ML pen-injectors  has been APPROVED from 11/20/2022 to 11/17/2023. Ran test claim, Copay is $24.99. This test claim was processed through [facility]- copay amounts may vary at other pharmacies due to pharmacy/plan contracts, or as the patient moves through the different stages of their insurance plan.

## 2022-11-30 ENCOUNTER — Other Ambulatory Visit (HOSPITAL_COMMUNITY): Payer: Self-pay

## 2022-11-30 NOTE — Telephone Encounter (Signed)
Pharmacy Patient Advocate Encounter  Received notification from [employer] that Prior Authorization for Bennie Pierini has been  N/A Below is what CMM says:  A claim submitted today for a quantity of 90 of QULIPTA 60 MG TABLET would deny for exceeding the maximum cost allowed by the plan.  Did a test claim and it's going through.   Called the pharmacy to have it filled. $0 copay

## 2022-12-12 ENCOUNTER — Other Ambulatory Visit: Payer: Self-pay | Admitting: Internal Medicine

## 2022-12-12 DIAGNOSIS — R000 Tachycardia, unspecified: Secondary | ICD-10-CM

## 2022-12-12 DIAGNOSIS — G43009 Migraine without aura, not intractable, without status migrainosus: Secondary | ICD-10-CM

## 2023-01-04 ENCOUNTER — Telehealth: Payer: No Typology Code available for payment source | Admitting: Physician Assistant

## 2023-01-04 DIAGNOSIS — B9689 Other specified bacterial agents as the cause of diseases classified elsewhere: Secondary | ICD-10-CM

## 2023-01-04 DIAGNOSIS — J019 Acute sinusitis, unspecified: Secondary | ICD-10-CM | POA: Diagnosis not present

## 2023-01-05 ENCOUNTER — Other Ambulatory Visit (HOSPITAL_COMMUNITY): Payer: Self-pay

## 2023-01-05 MED ORDER — AMOXICILLIN-POT CLAVULANATE 875-125 MG PO TABS
1.0000 | ORAL_TABLET | Freq: Two times a day (BID) | ORAL | 0 refills | Status: DC
Start: 2023-01-05 — End: 2023-03-25

## 2023-01-05 MED ORDER — BENZONATATE 100 MG PO CAPS
100.0000 mg | ORAL_CAPSULE | Freq: Three times a day (TID) | ORAL | 0 refills | Status: DC | PRN
Start: 2023-01-05 — End: 2023-03-25

## 2023-01-05 NOTE — Progress Notes (Signed)
I have spent 5 minutes in review of e-visit questionnaire, review and updating patient chart, medical decision making and response to patient.   Mia Milan Cody Jacklynn Dehaas, PA-C    

## 2023-01-05 NOTE — Progress Notes (Signed)
E-Visit for Sinus Problems  We are sorry that you are not feeling well.  Here is how we plan to help!  Based on what you have shared with me it looks like you have sinusitis.  Sinusitis is inflammation and infection in the sinus cavities of the head.  Based on your presentation I believe you most likely have Acute Bacterial Sinusitis.  This is an infection caused by bacteria and is treated with antibiotics. I have prescribed Augmentin 875mg /125mg  one tablet twice daily with food, for 7 days. I have also prescribed a prescription cough medication to use as directed.  You may use an oral decongestant such as Mucinex D or if you have glaucoma or high blood pressure use plain Mucinex. Saline nasal spray help and can safely be used as often as needed for congestion.  If you develop worsening sinus pain, fever or notice severe headache and vision changes, or if symptoms are not better after completion of antibiotic, please schedule an appointment with a health care provider.    Sinus infections are not as easily transmitted as other respiratory infection, however we still recommend that you avoid close contact with loved ones, especially the very young and elderly.  Remember to wash your hands thoroughly throughout the day as this is the number one way to prevent the spread of infection!  Home Care: Only take medications as instructed by your medical team. Complete the entire course of an antibiotic. Do not take these medications with alcohol. A steam or ultrasonic humidifier can help congestion.  You can place a towel over your head and breathe in the steam from hot water coming from a faucet. Avoid close contacts especially the very young and the elderly. Cover your mouth when you cough or sneeze. Always remember to wash your hands.  Get Help Right Away If: You develop worsening fever or sinus pain. You develop a severe head ache or visual changes. Your symptoms persist after you have completed your  treatment plan.  Make sure you Understand these instructions. Will watch your condition. Will get help right away if you are not doing well or get worse.  Thank you for choosing an e-visit.  Your e-visit answers were reviewed by a board certified advanced clinical practitioner to complete your personal care plan. Depending upon the condition, your plan could have included both over the counter or prescription medications.  Please review your pharmacy choice. Make sure the pharmacy is open so you can pick up prescription now. If there is a problem, you may contact your provider through [employer] and have the prescription routed to another pharmacy.  Your safety is important to [place]. If you have drug allergies check your prescription carefully.   For the next 24 hours you can use MyChart to ask questions about today's visit, request a non-urgent call back, or ask for a work or school excuse. You will get an email in the next two days asking about your experience. I hope that your e-visit has been valuable and will speed your recovery.

## 2023-01-07 MED ORDER — PROMETHAZINE-DM 6.25-15 MG/5ML PO SYRP: 2.5000 mL | ORAL_SOLUTION | Freq: Four times a day (QID) | ORAL | 0 refills | Status: DC | PRN

## 2023-01-07 NOTE — Addendum Note (Signed)
Addended by: Waldon Merl on: 01/07/2023 12:21 PM   Modules accepted: Orders

## 2023-01-14 ENCOUNTER — Encounter: Payer: Self-pay | Admitting: Neurology

## 2023-02-23 ENCOUNTER — Encounter: Payer: Self-pay | Admitting: Internal Medicine

## 2023-02-24 ENCOUNTER — Other Ambulatory Visit: Payer: Self-pay | Admitting: Family

## 2023-02-24 MED ORDER — ZEPBOUND 7.5 MG/0.5ML ~~LOC~~ SOAJ: 7.5000 mg | SUBCUTANEOUS | 0 refills | Status: DC

## 2023-03-04 ENCOUNTER — Other Ambulatory Visit (HOSPITAL_COMMUNITY): Payer: Self-pay

## 2023-03-09 ENCOUNTER — Other Ambulatory Visit (HOSPITAL_COMMUNITY): Payer: Self-pay

## 2023-03-09 ENCOUNTER — Telehealth: Payer: Self-pay

## 2023-03-09 NOTE — Telephone Encounter (Signed)
Pharmacy Patient Advocate Encounter   Received notification from Patient Advice Request messages that prior authorization for Zepbound 7.5MG /0.5ML pen-injectors is required/requested.   Insurance verification completed.   The patient is insured through [employer] .   Per test claim: PA required; PA submitted to above mentioned insurance via CoverMyMeds Key/confirmation #/EOC [id number] Status is pending

## 2023-03-11 ENCOUNTER — Other Ambulatory Visit (HOSPITAL_COMMUNITY): Payer: Self-pay

## 2023-03-15 ENCOUNTER — Other Ambulatory Visit (HOSPITAL_COMMUNITY): Payer: Self-pay

## 2023-03-15 ENCOUNTER — Ambulatory Visit: Payer: No Typology Code available for payment source | Admitting: Internal Medicine

## 2023-03-17 ENCOUNTER — Other Ambulatory Visit (HOSPITAL_COMMUNITY): Payer: Self-pay

## 2023-03-17 NOTE — Telephone Encounter (Signed)
Pharmacy Patient Advocate Encounter  Received notification from [employer] that Prior Authorization for Zepbound 7.5mg /0.64ml has been APPROVED from 03/17/23  to 09/17/23   PA #/Case ID/Reference #: [id number]  [facility] to notify of the approval

## 2023-03-17 NOTE — Telephone Encounter (Signed)
 Patient has been made aware and gave a verbal understanding.

## 2023-03-18 ENCOUNTER — Other Ambulatory Visit: Payer: Self-pay | Admitting: Internal Medicine

## 2023-03-18 DIAGNOSIS — G43009 Migraine without aura, not intractable, without status migrainosus: Secondary | ICD-10-CM

## 2023-03-18 DIAGNOSIS — R000 Tachycardia, unspecified: Secondary | ICD-10-CM

## 2023-03-25 ENCOUNTER — Ambulatory Visit: Payer: No Typology Code available for payment source | Admitting: Internal Medicine

## 2023-03-25 ENCOUNTER — Encounter: Payer: Self-pay | Admitting: Internal Medicine

## 2023-03-25 VITALS — BP 102/76 | HR 78 | Temp 97.8°F | Ht 66.0 in | Wt 162.4 lb

## 2023-03-25 DIAGNOSIS — E66812 Obesity, class 2: Secondary | ICD-10-CM | POA: Diagnosis not present

## 2023-03-25 DIAGNOSIS — Z0001 Encounter for general adult medical examination with abnormal findings: Secondary | ICD-10-CM

## 2023-03-25 DIAGNOSIS — R300 Dysuria: Secondary | ICD-10-CM

## 2023-03-25 DIAGNOSIS — Z0000 Encounter for general adult medical examination without abnormal findings: Secondary | ICD-10-CM

## 2023-03-25 DIAGNOSIS — N3000 Acute cystitis without hematuria: Secondary | ICD-10-CM | POA: Diagnosis not present

## 2023-03-25 DIAGNOSIS — Z6839 Body mass index (BMI) 39.0-39.9, adult: Secondary | ICD-10-CM

## 2023-03-25 LAB — URINALYSIS, ROUTINE W REFLEX MICROSCOPIC
Bilirubin Urine: NEGATIVE
Ketones, ur: NEGATIVE
Nitrite: NEGATIVE
Specific Gravity, Urine: >=1.03 — AB (ref 1.000–1.030)
Total Protein, Urine: NEGATIVE
Urine Glucose: NEGATIVE
Urobilinogen, UA: 0.2 (ref 0.0–1.0)
pH: 6 (ref 5.0–8.0)

## 2023-03-25 MED ORDER — ZEPBOUND 7.5 MG/0.5ML ~~LOC~~ SOAJ: 7.5000 mg | SUBCUTANEOUS | 1 refills | Status: DC

## 2023-03-25 NOTE — Patient Instructions (Signed)

## 2023-03-25 NOTE — Progress Notes (Signed)
 Subjective:  Patient ID: Erin Harris, female    DOB: July 30, 1990  Age: 33 y.o. MRN: [id number]  CC: Annual Exam and Urinary Tract Infection   HPI Erin Harris presents for a CPX and f/up ----  Discussed the use of AI scribe software for clinical note transcription with the patient, who gave verbal consent to proceed.  History of Present Illness   Erin Harris is a 33 year old female who presents for a six-month follow-up visit.  She has been taking Zepbound, which has significantly improved her gastrointestinal symptoms such as nausea, vomiting, and abdominal pain, now only momentary and not substantial. However, she continues to experience constipation, which she manages with over-the-counter remedies like MiraLAX, Colace, and generic Dulcolax gummies as needed. She recently increased her Zepbound dose to 7.5 mg, starting last Saturday, and reports that the week has been okay, although constipation persists. She has noticed a plateau in her weight loss, maintaining around 165 pounds for several weeks to a month, which prompted the dosage increase.  She does not have regular menstrual cycles, experiencing only occasional spotting. Her last menstrual cycle was supposed to start this week, following the last pill of her contraceptive pack on Saturday. She is currently taking Crisel for oral contraception.  She mentions a possible urinary tract infection, with symptoms of discomfort or pain after urination and a sense of urgency. No dysuria, vaginal discharge, bleeding, or flank pain. She has experienced some low back pain, which she attributes to occasional tweaks.  Her blood pressure has been variable, with low readings and tachycardia. She takes propranolol at night to manage her heart rate, which has helped reduce it from over 100 to between 70 and 90.       Outpatient Medications Prior to Visit  Medication Sig Dispense Refill   Atogepant (QULIPTA) 60 MG TABS Take 1 tablet (60 mg total) by  mouth daily. 30 tablet 11   buPROPion (WELLBUTRIN XL) 300 MG 24 hr tablet Take 1 tablet (300 mg total) by mouth daily. 90 tablet 1   CRYSELLE-28 0.3-30 MG-MCG tablet TAKE 1 TABLET BY MOUTH EVERY DAY 84 tablet 1   methocarbamol (ROBAXIN) 500 MG tablet Take 1 tablet (500 mg total) by mouth every 6 (six) hours as needed for muscle spasms. 30 tablet 5   methylphenidate 54 MG PO CR tablet Take 54 mg by mouth every morning.     ondansetron (ZOFRAN-ODT) 4 MG disintegrating tablet Take 1 tablet (4 mg total) by mouth every 8 (eight) hours as needed for nausea or vomiting. 20 tablet 11   prazosin (MINIPRESS) 1 MG capsule Take 1 mg by mouth at bedtime.     propranolol ER (INDERAL LA) 60 MG 24 hr capsule TAKE 1 CAPSULE BY MOUTH EVERY DAY 90 capsule 0   QELBREE 200 MG 24 hr capsule Take 200 mg by mouth daily. 1 tablet in the AM 2 tablets in the PM     Rimegepant Sulfate (NURTEC) 75 MG TBDP Take 1 tablet (75 mg total) by mouth daily as needed. 8 tablet 11   rizatriptan (MAXALT-MLT) 10 MG disintegrating tablet Take 1 tablet earliest onset of migraine.  May repeat in 2 hours if needed.  Maximum 2 tablets in 24 hours. 10 tablet 5   traZODone (DESYREL) 50 MG tablet Take 50 mg by mouth at bedtime as needed.     venlafaxine XR (EFFEXOR-XR) 150 MG 24 hr capsule Take 1 capsule (150 mg total) by mouth daily. (Patient taking differently: Take 150  mg by mouth daily. 2 tab to total 300 mg) 90 capsule 1   tirzepatide (ZEPBOUND) 7.5 MG/0.5ML Pen Inject 7.5 mg into the skin once a week. 6 mL 0   amoxicillin-clavulanate (AUGMENTIN) 875-125 MG tablet Take 1 tablet by mouth 2 (two) times daily. 14 tablet 0   benzonatate (TESSALON) 100 MG capsule Take 1 capsule (100 mg total) by mouth 3 (three) times daily as needed for cough. 30 capsule 0   promethazine-dextromethorphan (PROMETHAZINE-DM) 6.25-15 MG/5ML syrup Take 2.5 mLs by mouth 4 (four) times daily as needed for cough. 118 mL 0   No facility-administered medications prior to  visit.    ROS Review of Systems  Constitutional: Negative.  Negative for appetite change, chills, diaphoresis, fatigue and fever.  HENT: Negative.    Eyes: Negative.   Respiratory: Negative.  Negative for cough, chest tightness, shortness of breath and wheezing.   Cardiovascular:  Negative for chest pain, palpitations and leg swelling.  Gastrointestinal: Negative.  Negative for abdominal pain, constipation, diarrhea, nausea and vomiting.  Genitourinary:  Positive for dysuria. Negative for flank pain and hematuria.  Musculoskeletal:  Positive for back pain.  Skin: Negative.   Neurological:  Negative for dizziness and weakness.  Hematological:  Negative for adenopathy. Does not bruise/bleed easily.  Psychiatric/Behavioral: Negative.      Objective:  BP 102/76 (BP Location: Left Arm, Patient Position: Sitting, Cuff Size: Normal)   Pulse 78   Temp 97.8 F (36.6 C) (Oral)   Ht 5\' 6"  (1.676 m)   Wt 162 lb 6.4 oz (73.7 kg)   SpO2 99%   BMI 26.21 kg/m   BP Readings from Last 3 Encounters:  03/25/23 102/76  11/27/22 104/71  11/09/22 124/72    Wt Readings from Last 3 Encounters:  03/25/23 162 lb 6.4 oz (73.7 kg)  11/27/22 190 lb 12.8 oz (86.5 kg)  11/09/22 191 lb (86.6 kg)    Physical Exam Vitals reviewed.  Constitutional:      Appearance: Normal appearance.  HENT:     Nose: Nose normal.     Mouth/Throat:     Mouth: Mucous membranes are moist.  Eyes:     General: No scleral icterus.    Conjunctiva/sclera: Conjunctivae normal.  Cardiovascular:     Rate and Rhythm: Normal rate and regular rhythm.     Heart sounds: No murmur heard.    No friction rub. No gallop.  Pulmonary:     Effort: Pulmonary effort is normal.     Breath sounds: No stridor. No wheezing, rhonchi or rales.  Abdominal:     General: Abdomen is flat. Bowel sounds are decreased.     Palpations: There is no mass.     Tenderness: There is no abdominal tenderness. There is no guarding.     Hernia: No  hernia is present.  Musculoskeletal:        General: Normal range of motion.     Cervical back: Neck supple.     Right lower leg: No edema.     Left lower leg: No edema.  Lymphadenopathy:     Cervical: No cervical adenopathy.  Skin:    General: Skin is dry.  Neurological:     General: No focal deficit present.     Mental Status: She is alert. Mental status is at baseline.  Psychiatric:        Mood and Affect: Mood normal.        Behavior: Behavior normal.     Lab Results  Component Value  Date   WBC 9.1 08/27/2022   HGB 13.0 08/27/2022   HCT 39.3 08/27/2022   PLT 288.0 08/27/2022   GLUCOSE 84 11/09/2022   CHOL 139 02/05/2022   TRIG 102.0 02/05/2022   HDL 39.80 02/05/2022   LDLCALC 79 02/05/2022   ALT 43 (H) 11/09/2022   AST 26 11/09/2022   NA 138 11/09/2022   K 3.5 11/09/2022   CL 103 11/09/2022   CREATININE 0.67 11/09/2022   BUN 7 11/09/2022   CO2 29 11/09/2022   TSH 1.88 08/27/2022   INR 1.2 (H) 08/28/2022   HGBA1C 5.3 02/05/2022    MR CERVICAL SPINE WO CONTRAST Result Date: 09/11/2021 CLINICAL DATA:  Initial evaluation for chronic neck pain, cervicalgia. EXAM: MRI CERVICAL SPINE WITHOUT CONTRAST TECHNIQUE: Multiplanar, multisequence MR imaging of the cervical spine was performed. No intravenous contrast was administered. COMPARISON:  None available. FINDINGS: Alignment: Straightening with slight reversal of the normal cervical lordosis. No significant listhesis. Vertebrae: Vertebral body height maintained without acute or chronic fracture. Bone marrow signal intensity within normal limits. No discrete or worrisome osseous lesions. No abnormal marrow edema. Cord: Focal prominence of the central canal versus tiny syrinx seen at the level of C6-7 (series 5, image 31). This measures up to 2 mm in maximal diameter. Signal intensity within the visualized cord is otherwise within normal limits. Posterior Fossa, vertebral arteries, paraspinal tissues: Unremarkable. Disc levels:  C2-C3: Unremarkable. C3-C4: Minimal disc bulge. No spinal stenosis. Foramina remain patent. C4-C5: Minimal disc bulge with endplate spurring. No significant spinal stenosis. Remain patent. C5-C6: Minimal disc bulge with superimposed tiny central disc protrusion. No significant spinal stenosis. Foramina remain patent. C6-C7: Degenerative intervertebral disc space narrowing with diffuse disc osteophyte complex. Flattening and partial effacement of the ventral thecal sac with resultant mild spinal stenosis. Secondary mild cord flattening. Mild left C7 foraminal stenosis. Right neural foramen remains patent. C7-T1:  Seen only on sagittal projection.  Unremarkable. Visualized upper thoracic spine demonstrates no significant finding. IMPRESSION: 1. 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 2. Additional mild noncompressive disc bulging at C3-4 through C5-6 without significant stenosis or neural impingement. 3. Focal prominence of the central canal versus tiny syrinx at the level of C6-7. Electronically Signed   By: Rise Mu M.D.   On: 09/11/2021 17:18    Assessment & Plan:   Dysuria- Will treat for UTI. If sx's don't resolve will treat for BV. -     Urinalysis, Routine w reflex microscopic; Future -     CULTURE, URINE COMPREHENSIVE; Future  Class 2 severe obesity due to excess calories with serious comorbidity and body mass index (BMI) of 39.0 to 39.9 in adult (HCC) -     Zepbound; Inject 7.5 mg into the skin once a week.  Dispense: 6 mL; Refill: 1  Acute cystitis without hematuria -     Sulfamethoxazole-Trimethoprim; Take 1 tablet by mouth 2 (two) times daily for 3 days.  Dispense: 6 tablet; Refill: 0  Encounter for general adult medical examination with abnormal findings - Exam completed, no labs indicated, vaccines reviewed, cancer screenings are UTD, pt ed material was given.      Follow-up: Return in about 6 months (around  09/22/2023).  Sanda Linger, MD

## 2023-03-26 ENCOUNTER — Encounter: Payer: Self-pay | Admitting: Internal Medicine

## 2023-03-26 DIAGNOSIS — N3000 Acute cystitis without hematuria: Secondary | ICD-10-CM | POA: Insufficient documentation

## 2023-03-26 MED ORDER — SULFAMETHOXAZOLE-TRIMETHOPRIM 800-160 MG PO TABS: 1.0000 | ORAL_TABLET | Freq: Two times a day (BID) | ORAL | 0 refills | Status: AC

## 2023-03-27 LAB — CULTURE, URINE COMPREHENSIVE

## 2023-06-10 ENCOUNTER — Telehealth: Admitting: Physician Assistant

## 2023-06-10 DIAGNOSIS — B9689 Other specified bacterial agents as the cause of diseases classified elsewhere: Secondary | ICD-10-CM | POA: Diagnosis not present

## 2023-06-10 DIAGNOSIS — J208 Acute bronchitis due to other specified organisms: Secondary | ICD-10-CM | POA: Diagnosis not present

## 2023-06-10 MED ORDER — AZITHROMYCIN 250 MG PO TABS: ORAL_TABLET | ORAL | 0 refills | Status: AC

## 2023-06-10 MED ORDER — BENZONATATE 100 MG PO CAPS: 100.0000 mg | ORAL_CAPSULE | Freq: Three times a day (TID) | ORAL | 0 refills | Status: DC | PRN

## 2023-06-10 NOTE — Progress Notes (Signed)

## 2023-06-10 NOTE — Progress Notes (Signed)
 I have spent 5 minutes in review of e-visit questionnaire, review and updating patient chart, medical decision making and response to patient.   Piedad Climes, PA-C

## 2023-06-13 ENCOUNTER — Other Ambulatory Visit: Payer: Self-pay | Admitting: Internal Medicine

## 2023-06-13 DIAGNOSIS — G43009 Migraine without aura, not intractable, without status migrainosus: Secondary | ICD-10-CM

## 2023-06-13 DIAGNOSIS — R000 Tachycardia, unspecified: Secondary | ICD-10-CM

## 2023-06-18 ENCOUNTER — Ambulatory Visit: Payer: No Typology Code available for payment source | Admitting: Neurology

## 2023-06-23 ENCOUNTER — Telehealth: Payer: No Typology Code available for payment source | Admitting: Neurology

## 2023-07-13 NOTE — Progress Notes (Signed)
 Virtual Visit via Video Note  Consent was obtained for video visit:  Yes.   Answered questions that patient had about telehealth interaction:  Yes.   I discussed the limitations, risks, security and privacy concerns of performing an evaluation and management service by telemedicine. I also discussed with the patient that there may be a patient responsible charge related to this service. The patient expressed understanding and agreed to proceed.  Pt location: Home Physician Location: office Name of referring provider:  Arcadio Knuckles, MD I connected with Marzella Solan at patients initiation/request on 07/14/2023 at  8:30 AM EDT by video enabled telemedicine application and verified that I am speaking with the correct person using two identifiers. Pt MRN:  [id number] Pt DOB:  1990-09-28 Video Participants:  Marzella Solan  Assessment/Plan:   Migraine without aura, without status migrainosus, intractable, improved Cervicogenic headache Cervicalgia, myofascial   Migraine prevention:  Qulipta  60mg  daily, propranolol  ER 60mg  daily (helps with tachycardia as well).  Also on venlafaxine  XR 300mg  daily for anxiety which also is helpful for migraine.  Migraine rescue:  Nurtec PRN, rizatriptan  second line  Muscle spasms:  baclofen  Limit use of pain relievers to no more than 9 days out of month to prevent risk of rebound or medication-overuse headache. Keep headache diary Follow up 6 months.       Subjective:  Erin Harris is a 33 year old female with PTSD, depression, IBS, tachycardia and migraine who follows up for migraine and neck pain.   UPDATE: Migraines: Restarted Qulipta  and continued on propranolol . Intensity:  6-7/10 Duration:  30-60 minutes with Nurtec (not needed rizatriptan ) Frequency:  2 to 3 in 6 months.  Cervicogenic headaches and neck pain,  Started methocarbamol  but prefers baclofen . She works on neck stretches at home.  Sometimes gets dry needling.   Mild tension type  headache 3 days a month  Frequency of abortive medication: rarely Current NSAIDS/analgesics:  none Current triptans:  rizatriptan  10mg  (no longer using) Current ergotamine:  none Current anti-emetic:  Zofran  ODT 4-8mg  Current muscle relaxants: baclofen , methocarbamol   Current Antihypertensive medications:  propranolol  ER 60mg  daily Current Antidepressant medications:  venlafaxine  XR 300mg  daily, bupropion  XL 300mg  daily Current Anticonvulsant medications:  none Current anti-CGRP:  Qulipta  60mg  daily, Nurtec (rescue) Current Vitamins/Herbal/Supplements:  none Current Antihistamines/Decongestants:  none Other therapy:  none Hormone/birth control:  none   Caffeine:  None Diet:  Hydrates with water.  No soda.   Exercise:  not routine Depression:  yes; Anxiety:  yes Other pain:  no Sleep hygiene:  okay.  Trazodone  helps.     HISTORY:  Onset:  headaches in high school.  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  Location:  base of skull/back of neck radiating to jaw and ears bilaterally Quality:  throbbing, pressure Intensity:  6-7/10.   Aura:  absent.  One time had central blurred vision in left eye lasting 12 hours. Prodrome:  absent Associated symptoms:  Nausea, photophobia, phonophobia, sometimes vomiting.  She denies associated visual disturbance, unilateral numbness or weakness. Duration:  3-4 days (within a few hours with Ubrelvy ) Frequency:  once a week to once every other week (1 every 2 months on Qulipta ) Frequency of abortive medication: ibuprofen  1 to 2 days a week Triggers:  stress, poor sleep, poor diet, [place] food, chocolate, hormonal Relieving factors:  resting in quiet dark room Activity:  aggravates In between, keeps a constant low-grade dull frontal/periorbital-maxillary headache   MRI of brain without contrast on 03/09/2020  was normal.  MRI of cervical spine without contrast 09/11/2021 revealed degenerative changes with osteophyte at C6-7 causing mild  canal and left C7 foraminal stenosis, mild noncompressive disc bulges at C3-4 through C5-6 and questionable tiny syrinx at level of C6-7.   Past NSAIDS/analgesics:  Excedrin, acetaminophen , ibuprofen  Past abortive triptans:  sumatriptan (caused palpitations) Past abortive ergotamine:  none Past muscle relaxants:  Flexeril, tizanidine  (did not like how it made her feel), methocarbamol  Past anti-emetic:  promethazine  (effective) Past antihypertensive medications:  metoprolol Past antidepressant medications:  citalopram Past anticonvulsant medications:  none Past anti-CGRP:  Aimovig  140mg , Ubrelvy  100mg  (effective but no longer covered) Past vitamins/Herbal/Supplements:  none Past antihistamines/decongestants:  none Other past therapies:  trigger point injections, dry needling, PT     Family history of migraines:  mother  Past Medical History: Past Medical History:  Diagnosis Date   Allergy    Depression    Frequent headaches    IBS (irritable bowel syndrome)    Migraine    PTSD (post-traumatic stress disorder)    UTI (urinary tract infection)     Medications: Outpatient Encounter Medications as of 07/14/2023  Medication Sig   Atogepant  (QULIPTA ) 60 MG TABS Take 1 tablet (60 mg total) by mouth daily.   benzonatate  (TESSALON ) 100 MG capsule Take 1 capsule (100 mg total) by mouth 3 (three) times daily as needed for cough.   buPROPion  (WELLBUTRIN  XL) 300 MG 24 hr tablet Take 1 tablet (300 mg total) by mouth daily.   CRYSELLE -28 0.3-30 MG-MCG tablet TAKE 1 TABLET BY MOUTH EVERY DAY   methocarbamol  (ROBAXIN ) 500 MG tablet Take 1 tablet (500 mg total) by mouth every 6 (six) hours as needed for muscle spasms.   methylphenidate 54 MG PO CR tablet Take 54 mg by mouth every morning.   ondansetron  (ZOFRAN -ODT) 4 MG disintegrating tablet Take 1 tablet (4 mg total) by mouth every 8 (eight) hours as needed for nausea or vomiting.   prazosin (MINIPRESS) 1 MG capsule Take 1 mg by mouth at  bedtime.   propranolol  ER (INDERAL  LA) 60 MG 24 hr capsule TAKE 1 CAPSULE BY MOUTH EVERY DAY   QELBREE 200 MG 24 hr capsule Take 200 mg by mouth daily. 1 tablet in the AM 2 tablets in the PM   Rimegepant Sulfate (NURTEC) 75 MG TBDP Take 1 tablet (75 mg total) by mouth daily as needed.   rizatriptan  (MAXALT -MLT) 10 MG disintegrating tablet Take 1 tablet earliest onset of migraine.  May repeat in 2 hours if needed.  Maximum 2 tablets in 24 hours.   tirzepatide  (ZEPBOUND ) 7.5 MG/0.5ML Pen Inject 7.5 mg into the skin once a week.   traZODone  (DESYREL ) 50 MG tablet Take 50 mg by mouth at bedtime as needed.   venlafaxine  XR (EFFEXOR -XR) 150 MG 24 hr capsule Take 1 capsule (150 mg total) by mouth daily. (Patient taking differently: Take 150 mg by mouth daily. 2 tab to total 300 mg)   No facility-administered encounter medications on file as of 07/14/2023.    Allergies: Allergies  Allergen Reactions   Imitrex [Sumatriptan] Palpitations   Lactose Other (See Comments)    GI Issues   Nickel Rash    Family History: Family History  Problem Relation Age of Onset   Migraines Mother    Diabetes Father    Hyperlipidemia Father    Hypertension Father    Hearing loss Maternal Grandfather    Heart disease Maternal Grandfather    Early death Paternal Grandmother  Heart disease Paternal Grandmother    Hearing loss Paternal Grandfather    Cancer Paternal Grandfather    Cancer Sister    Birth defects Sister    Heart disease Sister    Alcohol abuse Brother     Observations/Objective:   No acute distress.  Alert and oriented.  Speech fluent and not dysarthric.  Language intact.     Follow Up Instructions:    -I discussed the assessment and treatment plan with the patient. The patient was provided an opportunity to ask questions and all were answered. The patient agreed with the plan and demonstrated an understanding of the instructions.   The patient was advised to call back or seek an  in-person evaluation if the symptoms worsen or if the condition fails to improve as anticipated.   Nathaniel Bald, DO

## 2023-07-14 ENCOUNTER — Telehealth (INDEPENDENT_AMBULATORY_CARE_PROVIDER_SITE_OTHER): Admitting: Neurology

## 2023-07-14 ENCOUNTER — Encounter: Payer: Self-pay | Admitting: Neurology

## 2023-07-14 DIAGNOSIS — G4486 Cervicogenic headache: Secondary | ICD-10-CM | POA: Diagnosis not present

## 2023-07-14 DIAGNOSIS — G43009 Migraine without aura, not intractable, without status migrainosus: Secondary | ICD-10-CM

## 2023-07-14 DIAGNOSIS — M542 Cervicalgia: Secondary | ICD-10-CM

## 2023-07-14 MED ORDER — PROMETHAZINE HCL 25 MG PO TABS: 25.0000 mg | ORAL_TABLET | Freq: Four times a day (QID) | ORAL | 5 refills | Status: AC | PRN

## 2023-07-14 MED ORDER — NURTEC 75 MG PO TBDP: 75.0000 mg | ORAL_TABLET | Freq: Every day | ORAL | 11 refills | Status: DC | PRN

## 2023-07-14 MED ORDER — BACLOFEN 10 MG PO TABS: 10.0000 mg | ORAL_TABLET | Freq: Three times a day (TID) | ORAL | 5 refills | Status: DC | PRN

## 2023-07-14 MED ORDER — ONDANSETRON 4 MG PO TBDP: 4.0000 mg | ORAL_TABLET | Freq: Three times a day (TID) | ORAL | 11 refills | Status: AC | PRN

## 2023-07-14 MED ORDER — RIZATRIPTAN BENZOATE 10 MG PO TBDP: ORAL_TABLET | ORAL | 5 refills | Status: DC

## 2023-07-14 NOTE — Patient Instructions (Signed)
 Continue current management

## 2023-07-22 ENCOUNTER — Emergency Department (HOSPITAL_BASED_OUTPATIENT_CLINIC_OR_DEPARTMENT_OTHER): Admitting: Radiology

## 2023-07-22 ENCOUNTER — Encounter (HOSPITAL_BASED_OUTPATIENT_CLINIC_OR_DEPARTMENT_OTHER): Payer: Self-pay | Admitting: *Deleted

## 2023-07-22 ENCOUNTER — Other Ambulatory Visit: Payer: Self-pay

## 2023-07-22 ENCOUNTER — Emergency Department (HOSPITAL_BASED_OUTPATIENT_CLINIC_OR_DEPARTMENT_OTHER)
Admission: EM | Admit: 2023-07-22 | Discharge: 2023-07-22 | Disposition: A | Discharge status: Home / Self Care | Attending: Emergency Medicine | Admitting: Emergency Medicine

## 2023-07-22 DIAGNOSIS — S20219A Contusion of unspecified front wall of thorax, initial encounter: Secondary | ICD-10-CM | POA: Insufficient documentation

## 2023-07-22 DIAGNOSIS — W228XXA Striking against or struck by other objects, initial encounter: Secondary | ICD-10-CM | POA: Insufficient documentation

## 2023-07-22 DIAGNOSIS — S299XXA Unspecified injury of thorax, initial encounter: Secondary | ICD-10-CM | POA: Diagnosis present

## 2023-07-22 DIAGNOSIS — R079 Chest pain, unspecified: Secondary | ICD-10-CM | POA: Insufficient documentation

## 2023-07-22 DIAGNOSIS — Y990 Civilian activity done for income or pay: Secondary | ICD-10-CM | POA: Diagnosis not present

## 2023-07-22 MED ORDER — IBUPROFEN 400 MG PO TABS
600.0000 mg | ORAL_TABLET | Freq: Once | ORAL | Status: AC
  Administered 2023-07-22: 600 mg via ORAL
  Filled 2023-07-22: qty 1
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
## 2023-07-22 NOTE — Discharge Instructions (Signed)
 Recommend ibuprofen  600 mg (3 of the over-the-counter strength tablets) every 6 hours. Warm compresses to the sore area. Return to the ED with any new or concerning symptoms at any time.

## 2023-07-22 NOTE — ED Provider Notes (Signed)
 [place] EMERGENCY DEPARTMENT AT [facility] Provider Note   CSN: [id number] Arrival date & time: 07/22/23  1541     Patient presents with: Chest Pain   Erin Harris is a 33 y.o. female.   Patient was assisting in orthopedic surgery around 11:00 this morning. She was struck in the center of her chest when the surgeon forcibly pulled the drill from the joint. She reports minimal symptoms at the time of the impact, but has had progressively worsening, including mid-chest discomfort that she feels affects her breathing, and hesitancy when swallowing solids without regurgitation.   The history is provided by the patient. No language interpreter was used.  Chest Pain      Prior to Admission medications   Medication Sig Start Date End Date Taking? Authorizing Provider  JORNAY PM 40 MG CP24 Take 1 capsule by mouth at bedtime. 06/15/23  Yes [provider]  Atogepant  (QULIPTA ) 60 MG TABS Take 1 tablet (60 mg total) by mouth daily. 11/27/22   Skeet Juliene SAUNDERS, DO  baclofen  (LIORESAL ) 10 MG tablet Take 1 tablet (10 mg total) by mouth 3 (three) times daily as needed for muscle spasms. 07/14/23   Skeet Juliene SAUNDERS, DO  benzonatate  (TESSALON ) 100 MG capsule Take 1 capsule (100 mg total) by mouth 3 (three) times daily as needed for cough. 06/10/23   Gladis Elsie BROCKS, PA-C  buPROPion  (WELLBUTRIN  XL) 300 MG 24 hr tablet Take 1 tablet (300 mg total) by mouth daily. 08/29/20   Joshua Debby CROME, MD  CRYSELLE -28 0.3-30 MG-MCG tablet TAKE 1 TABLET BY MOUTH EVERY DAY 09/28/20   Joshua Debby CROME, MD  ondansetron  (ZOFRAN -ODT) 4 MG disintegrating tablet Take 1 tablet (4 mg total) by mouth every 8 (eight) hours as needed for nausea or vomiting. 07/14/23   Skeet Juliene SAUNDERS, DO  prazosin (MINIPRESS) 1 MG capsule Take 1 mg by mouth at bedtime.    [provider]  promethazine  (PHENERGAN ) 25 MG tablet Take 1 tablet (25 mg total) by mouth every 6 (six) hours as needed for nausea or vomiting. 07/14/23    Skeet Juliene SAUNDERS, DO  propranolol  ER (INDERAL  LA) 60 MG 24 hr capsule TAKE 1 CAPSULE BY MOUTH EVERY DAY 06/14/23   Joshua Debby CROME, MD  QELBREE 200 MG 24 hr capsule Take 200 mg by mouth daily. 1 tablet in the AM 2 tablets in the PM    [provider]  Rimegepant Sulfate (NURTEC) 75 MG TBDP Take 1 tablet (75 mg total) by mouth daily as needed. 07/14/23   Skeet Juliene SAUNDERS, DO  rizatriptan  (MAXALT -MLT) 10 MG disintegrating tablet Take 1 tablet earliest onset of migraine.  May repeat in 2 hours if needed.  Maximum 2 tablets in 24 hours. 07/14/23   Skeet Juliene SAUNDERS, DO  tirzepatide  (ZEPBOUND ) 7.5 MG/0.5ML Pen Inject 7.5 mg into the skin once a week. 03/25/23   Joshua Debby CROME, MD  traZODone  (DESYREL ) 50 MG tablet Take 50 mg by mouth at bedtime as needed. 12/12/22   [provider]  venlafaxine  XR (EFFEXOR -XR) 150 MG 24 hr capsule Take 1 capsule (150 mg total) by mouth daily. 08/29/20   Joshua Debby CROME, MD    Allergies: Imitrex [sumatriptan], Lactose, and Nickel    Review of Systems  Cardiovascular:  Positive for chest pain.    Updated Vital Signs BP 116/80   Pulse 83   Temp 98.1 F (36.7 C)   Resp 19   SpO2 100%   Physical Exam Vitals  and nursing note reviewed.  Constitutional:      Appearance: She is well-developed.  HENT:     Head: Normocephalic.   Cardiovascular:     Rate and Rhythm: Normal rate and regular rhythm.     Heart sounds: No murmur heard. Pulmonary:     Effort: Pulmonary effort is normal.     Breath sounds: Normal breath sounds. No wheezing, rhonchi or rales.     Comments: Good air movement throughout. Chest:     Chest wall: Tenderness present.      Comments: No bruising of chest wall. No swelling. Abdominal:     General: Bowel sounds are normal.     Palpations: Abdomen is soft.     Tenderness: There is no abdominal tenderness. There is no guarding or rebound.   Musculoskeletal:        General: Normal range of motion.     Cervical back: Normal range of  motion and neck supple.   Skin:    General: Skin is warm and dry.   Neurological:     General: No focal deficit present.     Mental Status: She is alert and oriented to person, place, and time.     (all labs ordered are listed, but only abnormal results are displayed) Labs Reviewed - No data to display  EKG: EKG Interpretation Date/Time:  Thursday July 22 2023 16:46:21 EDT Ventricular Rate:  84 PR Interval:  177 QRS Duration:  104 QT Interval:  366 QTC Calculation: 433 R Axis:   74  Text Interpretation: Sinus rythm normal no old comparison Confirmed by Armenta Canning [phone]) on 07/22/2023 5:04:28 PM  Radiology: DG Chest 2 View Result Date: 07/22/2023 CLINICAL DATA:  chest trauma EXAM: CHEST - 2 VIEW COMPARISON:  August 27, 2022 FINDINGS: No focal airspace consolidation, pleural effusion, or pneumothorax. No cardiomegaly.No acute fracture or destructive lesion. Cholecystectomy clips. IMPRESSION: No acute cardiopulmonary abnormality. Electronically Signed   By: Rogelia Myers M.D.   On: 07/22/2023 16:12     Procedures   Medications Ordered in the ED  ibuprofen  (ADVIL ) tablet 600 mg (600 mg Oral Given 07/22/23 1657)                                    Medical Decision Making This patient presents to the ED for concern of chest injury, this involves an extensive number of treatment options, and is a complaint that carries with it a high risk of complications and morbidity.  The differential diagnosis includes fracture, PTX, Cardiac contusion, tampanade   Co morbidities that complicate the patient evaluation  PTSD, IBS   Additional history obtained:  Additional history and/or information obtained from chart review, notable for n/a   Lab Tests:  I Ordered, and personally interpreted labs.  The pertinent results include:  n/a    Imaging Studies ordered:  I ordered imaging studies including CXR I independently visualized and interpreted imaging which showed NAD I  agree with the radiologist interpretation   Cardiac Monitoring:  The patient was maintained on a cardiac monitor.  I personally viewed and interpreted the cardiac monitored which showed an underlying rhythm of:  EKG Interpretation Date/Time:  Thursday July 22 2023 16:46:21 EDT Ventricular Rate:  84 PR Interval:  177 QRS Duration:  104 QT Interval:  366 QTC Calculation: 433 R Axis:   74  Text Interpretation: Sinus rythm normal no old comparison Confirmed by Armenta Canning (  [id number]) on 07/22/2023 5:04:28 PM   Medicines ordered and prescription drug management:  I ordered medication including ibuprofen   for pain Reevaluation of the patient after these medicines showed that the patient stayed the same I have reviewed the patients home medicines and have made adjustments as needed   Test Considered:  N/a   Critical Interventions:  N/a   Consultations Obtained:  I requested consultation with the n/a,  and discussed lab and imaging findings as well as pertinent plan - they recommend: n/a   Problem List / ED Course:  Direct impact to chest with heavy instrument CP, soreness, hesitancy when swallowing  CXR clear, EKG unremarkable. No PTX, arrhythmia, exam abnormality, do not suspect sternal or rib fracture Recommend NSAIDs, return to ED with any new or concerning symptoms.    Reevaluation:  After the interventions noted above, I reevaluated the patient and found that they have :stayed the same   Social Determinants of Health:  Never a smoker   Disposition:  After consideration of the diagnostic results and the patients response to treatment, I feel that the patient would benefit from discharge home.   Amount and/or Complexity of Data Reviewed Radiology: ordered.        Final diagnoses:  Contusion of chest wall, unspecified laterality, initial encounter    ED Discharge Orders     None          Odell Balls, PA-C 07/22/23 1716    Armenta Canning, MD 08/06/23 1626

## 2023-09-10 ENCOUNTER — Other Ambulatory Visit: Payer: Self-pay | Admitting: Internal Medicine

## 2023-09-10 DIAGNOSIS — G43009 Migraine without aura, not intractable, without status migrainosus: Secondary | ICD-10-CM

## 2023-09-10 DIAGNOSIS — R000 Tachycardia, unspecified: Secondary | ICD-10-CM

## 2023-12-16 ENCOUNTER — Other Ambulatory Visit: Payer: Self-pay | Admitting: Internal Medicine

## 2023-12-16 DIAGNOSIS — G43009 Migraine without aura, not intractable, without status migrainosus: Secondary | ICD-10-CM

## 2023-12-16 DIAGNOSIS — R000 Tachycardia, unspecified: Secondary | ICD-10-CM

## 2023-12-25 ENCOUNTER — Other Ambulatory Visit: Payer: Self-pay | Admitting: Neurology

## 2023-12-28 ENCOUNTER — Other Ambulatory Visit: Payer: Self-pay | Admitting: Neurology

## 2023-12-28 ENCOUNTER — Other Ambulatory Visit (HOSPITAL_COMMUNITY): Payer: Self-pay

## 2023-12-28 ENCOUNTER — Telehealth: Payer: Self-pay | Admitting: Pharmacy Technician

## 2023-12-28 ENCOUNTER — Encounter: Payer: Self-pay | Admitting: Neurology

## 2023-12-28 NOTE — Telephone Encounter (Signed)
 Pharmacy Patient Advocate Encounter   Received notification from Patient Advice Request messages that prior authorization for QULIPTA  60MG  is required/requested.   Insurance verification completed.   The patient is insured through [employer].   Per test claim: PA required; PA submitted to above mentioned insurance via Latent Key/confirmation #/EOC [id number] Status is pending

## 2023-12-28 NOTE — Telephone Encounter (Signed)
 PA has been submitted, and telephone encounter has been created. Please see telephone encounter dated 1.22.25.

## 2023-12-29 ENCOUNTER — Other Ambulatory Visit (HOSPITAL_COMMUNITY): Payer: Self-pay

## 2023-12-29 NOTE — Telephone Encounter (Signed)
 Pharmacy Patient Advocate Encounter  Received notification from [employer] that Prior Authorization for QULIPTA  60MG  has been APPROVED from 12.3.25 to 12.3.26. Ran test claim, Copay is $0. This test claim was processed through [facility]- copay amounts may vary at other pharmacies due to pharmacy/plan contracts, or as the patient moves through the different stages of their insurance plan.   PA #/Case ID/Reference #:

## 2024-01-11 ENCOUNTER — Ambulatory Visit: Admitting: Internal Medicine

## 2024-01-11 ENCOUNTER — Encounter: Payer: Self-pay | Admitting: Internal Medicine

## 2024-01-11 VITALS — BP 116/64 | HR 82 | Temp 98.5°F | Resp 16 | Ht 66.0 in | Wt 135.2 lb

## 2024-01-11 DIAGNOSIS — R000 Tachycardia, unspecified: Secondary | ICD-10-CM

## 2024-01-11 DIAGNOSIS — R634 Abnormal weight loss: Secondary | ICD-10-CM | POA: Diagnosis not present

## 2024-01-11 DIAGNOSIS — K7581 Nonalcoholic steatohepatitis (NASH): Secondary | ICD-10-CM | POA: Diagnosis not present

## 2024-01-11 DIAGNOSIS — J4541 Moderate persistent asthma with (acute) exacerbation: Secondary | ICD-10-CM

## 2024-01-11 DIAGNOSIS — D539 Nutritional anemia, unspecified: Secondary | ICD-10-CM

## 2024-01-11 DIAGNOSIS — Z0000 Encounter for general adult medical examination without abnormal findings: Secondary | ICD-10-CM

## 2024-01-11 DIAGNOSIS — Z0001 Encounter for general adult medical examination with abnormal findings: Secondary | ICD-10-CM

## 2024-01-11 NOTE — Progress Notes (Signed)
 "  Subjective:  Patient ID: Erin Harris, female    DOB: August 17, 1990  Age: 33 y.o. MRN: [id number]  CC: Annual Exam (Blood work and discuss her weight loss. ) and Anemia   HPI Erin Harris presents for a CPX and f/up ---  Discussed the use of AI scribe software for clinical note transcription with the patient, who gave verbal consent to proceed.  History of Present Illness Erin Harris is a 33 year old female who presents with hair loss and gastrointestinal symptoms related to Zepbound  use.  She experiences significant hair loss attributed to the medication Zepbound . She has been trying various vitamins and supplements, which she feels are helping. The hair loss has affected her self-image, leading her to get hair extensions recently.  She experiences occasional nausea and vomiting, which have decreased in frequency. Previously, she had daily stomach pain, but this has improved. Her appetite has increased, and she is focusing on consuming protein-rich foods. She reports some constipation but has found a regimen of vitamins that helps her maintain regular bowel movements several times a week. She does not use any specific medications like Colace or Miralax for bowel stimulation.  She has experienced significant weight loss, which she feels is more than expected compared to others she knows on similar treatments.  She is on multiple psychiatric medications, including bupropion , Effexor , trazodone , and a new medication for treatment-resistant depression, which she feels has made a noticeable difference. She is also trying Vyvanse for ADHD.  For migraines, she takes propranolol  at night, Qulipta  daily, and uses Nurtec as a rescue medication. Propranolol  was originally prescribed for tachycardia, which the patient described as usually being in the 90s-100s while sitting, and she continues to take it.  She reports occasional feelings of weakness, dizziness, or lightheadedness, but not recently. No  concerns about breast or colon cancer. Pap smears are up to date. She has received a flu shot and the initial COVID vaccines but no boosters. Her last menstrual cycle was approximately two weeks ago.     Outpatient Medications Prior to Visit  Medication Sig Dispense Refill   AUVELITY 45-105 MG TBCR      baclofen  (LIORESAL ) 10 MG tablet Take 1 tablet (10 mg total) by mouth 3 (three) times daily as needed for muscle spasms. 90 each 5   buPROPion  (WELLBUTRIN  XL) 300 MG 24 hr tablet Take 1 tablet (300 mg total) by mouth daily. 90 tablet 1   CRYSELLE -28 0.3-30 MG-MCG tablet TAKE 1 TABLET BY MOUTH EVERY DAY 84 tablet 1   JORNAY PM 40 MG CP24 Take 1 capsule by mouth at bedtime.     ondansetron  (ZOFRAN -ODT) 4 MG disintegrating tablet Take 1 tablet (4 mg total) by mouth every 8 (eight) hours as needed for nausea or vomiting. 20 tablet 11   prazosin (MINIPRESS) 1 MG capsule Take 1 mg by mouth at bedtime.     promethazine  (PHENERGAN ) 25 MG tablet Take 1 tablet (25 mg total) by mouth every 6 (six) hours as needed for nausea or vomiting. 30 tablet 5   propranolol  ER (INDERAL  LA) 60 MG 24 hr capsule TAKE 1 CAPSULE BY MOUTH EVERY DAY 90 capsule 0   QELBREE 200 MG 24 hr capsule Take 200 mg by mouth daily. 1 tablet in the AM 2 tablets in the PM     QULIPTA  60 MG TABS TAKE 1 TABLET BY MOUTH EVERY DAY 30 tablet 2   Rimegepant Sulfate (NURTEC) 75 MG TBDP Take 1 tablet (75 mg total)  by mouth daily as needed. 8 tablet 11   rizatriptan  (MAXALT -MLT) 10 MG disintegrating tablet Take 1 tablet earliest onset of migraine.  May repeat in 2 hours if needed.  Maximum 2 tablets in 24 hours. 10 tablet 5   traZODone  (DESYREL ) 50 MG tablet Take 50 mg by mouth at bedtime as needed.     venlafaxine  XR (EFFEXOR -XR) 150 MG 24 hr capsule Take 1 capsule (150 mg total) by mouth daily. 90 capsule 1   benzonatate  (TESSALON ) 100 MG capsule Take 1 capsule (100 mg total) by mouth 3 (three) times daily as needed for cough. 30 capsule 0    tirzepatide  (ZEPBOUND ) 7.5 MG/0.5ML Pen Inject 7.5 mg into the skin once a week. 6 mL 1   No facility-administered medications prior to visit.    ROS Review of Systems  Constitutional:  Positive for unexpected weight change. Negative for chills, diaphoresis and fatigue.  HENT:  Negative for trouble swallowing.   Eyes:  Negative for visual disturbance.  Respiratory: Negative.  Negative for cough, chest tightness, shortness of breath and wheezing.   Cardiovascular:  Negative for chest pain, palpitations and leg swelling.  Gastrointestinal: Negative.  Negative for abdominal pain, constipation, diarrhea, nausea and vomiting.  Genitourinary:  Negative for difficulty urinating and dysuria.  Musculoskeletal: Negative.  Negative for arthralgias, myalgias and neck pain.  Skin: Negative.   Neurological:  Negative for dizziness, weakness and light-headedness.  Hematological:  Negative for adenopathy. Does not bruise/bleed easily.  Psychiatric/Behavioral: Negative.      Objective:  BP 116/64 (BP Location: Left Arm, Patient Position: Sitting, Cuff Size: Normal)   Pulse 82   Temp 98.5 F (36.9 C) (Oral)   Resp 16   Ht 5' 6 (1.676 m)   Wt 135 lb 3.2 oz (61.3 kg)   LMP 12/28/2023 (Exact Date)   SpO2 99%   BMI 21.82 kg/m   BP Readings from Last 3 Encounters:  01/11/24 116/64  07/22/23 116/80  03/25/23 102/76    Wt Readings from Last 3 Encounters:  01/11/24 135 lb 3.2 oz (61.3 kg)  03/25/23 162 lb 6.4 oz (73.7 kg)  11/27/22 190 lb 12.8 oz (86.5 kg)    Physical Exam Vitals reviewed.  Constitutional:      Appearance: Normal appearance.  HENT:     Nose: Nose normal.  Eyes:     General: No scleral icterus.    Conjunctiva/sclera: Conjunctivae normal.  Cardiovascular:     Rate and Rhythm: Normal rate and regular rhythm.     Heart sounds: No murmur heard.    No friction rub. No gallop.  Pulmonary:     Effort: Pulmonary effort is normal.     Breath sounds: No stridor. No wheezing,  rhonchi or rales.  Abdominal:     General: Abdomen is flat. Bowel sounds are normal. There is no distension.     Palpations: There is no hepatomegaly, splenomegaly or mass.     Tenderness: There is no abdominal tenderness. There is no guarding.     Hernia: No hernia is present.  Musculoskeletal:        General: No swelling. Normal range of motion.     Cervical back: Neck supple.     Right lower leg: No edema.     Left lower leg: No edema.  Lymphadenopathy:     Cervical: No cervical adenopathy.  Skin:    General: Skin is warm.     Coloration: Skin is not jaundiced.     Findings: No bruising  or rash.  Neurological:     General: No focal deficit present.     Mental Status: She is alert.  Psychiatric:        Mood and Affect: Mood normal.        Behavior: Behavior normal.     Lab Results  Component Value Date   WBC 7.1 01/11/2024   HGB 11.9 (L) 01/11/2024   HCT 34.7 (L) 01/11/2024   PLT 278.0 01/11/2024   GLUCOSE 75 01/11/2024   CHOL 139 02/05/2022   TRIG 102.0 02/05/2022   HDL 39.80 02/05/2022   LDLCALC 79 02/05/2022   ALT 26 01/11/2024   AST 20 01/11/2024   NA 137 01/11/2024   K 3.9 01/11/2024   CL 101 01/11/2024   CREATININE 0.73 01/11/2024   BUN 10 01/11/2024   CO2 31 01/11/2024   TSH 2.09 01/11/2024   INR 1.2 (H) 01/11/2024   HGBA1C 5.3 02/05/2022    DG Chest 2 View Result Date: 07/22/2023 CLINICAL DATA:  chest trauma EXAM: CHEST - 2 VIEW COMPARISON:  August 27, 2022 FINDINGS: No focal airspace consolidation, pleural effusion, or pneumothorax. No cardiomegaly.No acute fracture or destructive lesion. Cholecystectomy clips. IMPRESSION: No acute cardiopulmonary abnormality. Electronically Signed   By: Rogelia Myers M.D.   On: 07/22/2023 16:12    Fibrosis 4 Score = .49 (Low risk)        Interpretation for patients with NAFLD          <1.30       -  F0-F1 (Low risk)          1.30-2.67 -  Indeterminate           >2.67      -  F3-F4 (High risk)     Validated for  ages 68-65         Assessment & Plan:   NASH (nonalcoholic steatohepatitis) -     Hepatic function panel; Future -     Protime-INR; Future -     Tirzepatide -Weight Management; Inject 7.5 mg into the skin once a week.  Dispense: 12 mL; Refill: 0  Encounter for general adult medical examination with abnormal findings- Exam completed, labs reviewed, vaccines reviewed, cancer screenings are UTD, pt ed material was given.   Tachycardia- HR is normal. -     CBC with Differential/Platelet; Future -     TSH; Future  Recent unexplained weight loss- Labs are reassuring. -     Lipase; Future -     Basic metabolic panel with GFR; Future -     TSH; Future  Deficiency anemia- Vit levels are normal. -     IBC + Ferritin; Future -     Reticulocytes; Future -     Folate; Future -     Zinc ; Future -     Vitamin B1; Future -     Vitamin B12; Future     Follow-up: Return in about 6 months (around 07/11/2024).  Debby Molt, MD "

## 2024-01-11 NOTE — Patient Instructions (Signed)
 Tirzepatide Injection What is this medication? TIRZEPATIDE (tir ZEP a tide) treats type 2 diabetes. It works by increasing insulin levels in your body, which decreases your blood sugar (glucose). It also reduces the amount of sugar released into your blood and slows down your digestion. Changes to diet and exercise are often combined with this medication. This medicine may be used for other purposes; ask your health care provider or pharmacist if you have questions. COMMON BRAND NAME(S): MOUNJARO What should I tell my care team before I take this medication? They need to know if you have any of these conditions: Eye disease caused by diabetes Gallbladder disease Have or have had pancreatitis Having surgery Kidney disease Personal or family history of MEN 2, a condition that causes endocrine gland tumors Personal or family history of thyroid  cancer Stomach or intestine problems, such as problems digesting food An unusual or allergic reaction to tirzepatide, other medications, foods, dyes, or preservatives Pregnant or trying to get pregnant Breastfeeding How should I use this medication? This medication is injected under the skin. You will be taught how to prepare and give it. It is given once every week (every 7 days). Keep taking it unless your health care provider tells you to stop. If you use this medication with insulin, you should inject this medication and the insulin separately. Do not mix them together. Do not give the injections right next to each other. Change (rotate) injection sites with each injection. This medication comes with INSTRUCTIONS FOR USE. Ask your pharmacist for directions on how to use this medication. Read the information carefully. Talk to your pharmacist or care team if you have questions. It is important that you put your used needles and syringes in a special sharps container. Do not put them in a trash can. If you do not have a sharps container, call your  pharmacist or care team to get one. A special MedGuide will be given to you by the pharmacist with each prescription and refill. Be sure to read this information carefully each time. Talk to your care team about the use of this medication in children. Special care may be needed. Overdosage: If you think you have taken too much of this medicine contact a poison control center or emergency room at once. NOTE: This medicine is only for you. Do not share this medicine with others. What if I miss a dose? If you miss a dose, take it as soon as you can unless it is more than 4 days (96 hours) late. If it is more than 4 days late, skip the missed dose. Take the next dose at the normal time. Do not take 2 doses within 3 days of each other. What may interact with this medication? Alcohol Antiviral medications for HIV or AIDS Aspirin and aspirin-like medications Beta blockers, such as atenolol, metoprolol , propranolol Certain medications for blood pressure, heart disease, irregular heart beat Chromium Clonidine Diuretics Estrogen or progestin hormones Fenofibrate Gemfibrozil Guanethidine Isoniazid Lanreotide Female hormones or anabolic steroids MAOIs, such as Marplan, Nardil, and Parnate Medications for weight loss Medications for allergies, asthma, cold, or cough Medications for depression, anxiety, or mental health conditions Niacin Nicotine NSAIDs, medications for pain and inflammation, such as ibuprofen or naproxen Octreotide Other medications for diabetes, such as glyburide, glipizide, or glimepiride Pasireotide Pentamidine Phenytoin Probenecid Quinolone antibiotics, such as ciprofloxacin, levofloxacin, ofloxacin Reserpine Some herbal dietary supplements Steroid medications, such as prednisone or cortisone Sulfamethoxazole; trimethoprim Thyroid  hormones Warfarin This list may not describe all possible  interactions. Give your health care provider a list of all the medicines, herbs,  non-prescription drugs, or dietary supplements you use. Also tell them if you smoke, drink alcohol, or use illegal drugs. Some items may interact with your medicine. What should I watch for while using this medication? Visit your care team for regular checks on your progress. Tell your care team if your symptoms do not start to get better or if they get worse. You may need blood work done while you are taking this medication. Your care team will monitor your HbA1C (A1C). This test shows what your average blood sugar (glucose) level was over the past 2 to 3 months. Know the symptoms of low blood sugar and know how to treat it. Always carry a source of quick sugar with you. Examples include hard sugar candy or glucose tablets. Make sure others know that you can choke if you eat or drink if your blood sugar is too low and you are unable to care for yourself. Get medical help at once. Tell your care team if you have high blood sugar. Your medication dose may change if your body is under stress. Some types of stress that may affect your blood sugar include fever, infection, and surgery. Do not share pens or cartridges with anyone, even if the needle is changed. Each pen should only be used by one person. Sharing could cause an infection. Wear a medical ID bracelet or chain. Carry a card that describes your condition. List the medications and doses you take on the card. Talk to your care team about your risk of cancer. You may be more at risk for certain types of cancer if you take this medication. Talk to your care team right away if you have a lump or swelling in your neck, hoarseness that does not go away, trouble swallowing, shortness of breath, or trouble breathing. Make sure you stay hydrated while taking this medication. Drink water often. Eat fruits and veggies that have a high water content. Drink more water when it is hot or you are active. Talk to your care team right away if you have fever, infection,  vomiting, diarrhea, or if you sweat a lot while taking this medication. The loss of too much body fluid may make it dangerous for you to take this medication. If you are going to need surgery or a procedure, tell your care team that you are taking this medication. Estrogen and progestin hormones that you take by mouth may not work as well while you are taking this medication. Switch to a non-oral contraceptive or add a barrier contraceptive for 4 weeks after starting this medication and after each dose increase. Talk to your care team about contraceptive options. They can help you find the option that works for you. Do not take this medication without first talking to your care team if you may be or could become pregnant. Your care team can help you find the option that works for you. Weight loss is not recommended during pregnancy. Maintaining healthy blood sugar levels can help reduce the risk of pregnancy complications. Talk to your care team if you are breastfeeding. When recommended, this medication may be taken. Its use during breastfeeding has not been well studied. Lactation may help lower your blood sugar levels. Your care team may recommend changes to your treatment plan. What side effects may I notice from receiving this medication? Side effects that you should report to your care team as soon as possible: Allergic  reactions--skin rash, itching, hives, swelling of the face, lips, tongue, or throat Change in vision Dehydration--increased thirst, dry mouth, feeling faint or lightheaded, headache, dark yellow or brown urine Fast or irregular heartbeat Gallbladder problems--severe stomach pain, nausea, vomiting, fever Kidney injury--decrease in the amount of urine, swelling of the ankles, hands, or feet Pancreatitis--severe stomach pain that spreads to your back or gets worse after eating or when touched, fever, nausea, vomiting Thyroid  cancer--new mass or lump in the neck, pain or trouble  swallowing, trouble breathing, hoarseness Side effects that usually do not require medical attention (report these to your care team if they continue or are bothersome): Constipation Diarrhea Loss of appetite Nausea Upset stomach This list may not describe all possible side effects. Call your doctor for medical advice about side effects. You may report side effects to FDA at 1-800-FDA-1088. Where should I keep my medication? Keep out of the reach of children and pets. Refrigeration (preferred): Store in the refrigerator. Keep this medication in the original carton until you are ready to take it. Do not freeze. Protect from light. Get rid of opened vials after use, even if there is medication left. Get rid of any unopened vials or pens after the expiration date. Room Temperature: This medication may be stored at room temperature below 30 degrees C (86 degrees F) for up to 21 days. Keep this medication in the original carton until you are ready to take it. Protect from light. Avoid exposure to extreme heat. Get rid of opened vials after use, even if there is medication left. Get rid of any unopened vials or pens after 21 days, or after they expire, whichever is first. To get rid of medications that are no longer needed or have expired: Take the medication to a medication take-back program. Check with your pharmacy or law enforcement to find a location. If you cannot return the medication, ask your pharmacist or care team how to get rid of this medication safely. NOTE: This sheet is a summary. It may not cover all possible information. If you have questions about this medicine, talk to your doctor, pharmacist, or health care provider.  2025 Elsevier/Gold Standard (2022-12-29 00:00:00)

## 2024-01-12 ENCOUNTER — Ambulatory Visit: Payer: Self-pay | Admitting: Internal Medicine

## 2024-01-12 DIAGNOSIS — D539 Nutritional anemia, unspecified: Secondary | ICD-10-CM | POA: Insufficient documentation

## 2024-01-12 LAB — CBC WITH DIFFERENTIAL/PLATELET
Basophils Absolute: 0 K/uL (ref 0.0–0.1)
Basophils Relative: 0.5 % (ref 0.0–3.0)
Eosinophils Absolute: 0.2 K/uL (ref 0.0–0.7)
Eosinophils Relative: 2.6 % (ref 0.0–5.0)
HCT: 34.7 % — ABNORMAL LOW (ref 36.0–46.0)
Hemoglobin: 11.9 g/dL — ABNORMAL LOW (ref 12.0–15.0)
Lymphocytes Relative: 39.3 % (ref 12.0–46.0)
Lymphs Abs: 2.8 K/uL (ref 0.7–4.0)
MCHC: 34.2 g/dL (ref 30.0–36.0)
MCV: 97.3 fl (ref 78.0–100.0)
Monocytes Absolute: 0.6 K/uL (ref 0.1–1.0)
Monocytes Relative: 7.8 % (ref 3.0–12.0)
Neutro Abs: 3.5 K/uL (ref 1.4–7.7)
Neutrophils Relative %: 49.8 % (ref 43.0–77.0)
Platelets: 278 K/uL (ref 150.0–400.0)
RBC: 3.56 Mil/uL — ABNORMAL LOW (ref 3.87–5.11)
RDW: 12.4 % (ref 11.5–15.5)
WBC: 7.1 K/uL (ref 4.0–10.5)

## 2024-01-12 LAB — LIPASE: Lipase: 38 U/L (ref 11.0–59.0)

## 2024-01-12 LAB — HEPATIC FUNCTION PANEL
ALT: 26 U/L (ref 3–35)
AST: 20 U/L (ref 5–37)
Albumin: 4 g/dL (ref 3.5–5.2)
Alkaline Phosphatase: 41 U/L (ref 39–117)
Bilirubin, Direct: 0.1 mg/dL (ref 0.1–0.3)
Total Bilirubin: 0.5 mg/dL (ref 0.2–1.2)
Total Protein: 7 g/dL (ref 6.0–8.3)

## 2024-01-12 LAB — BASIC METABOLIC PANEL WITH GFR
BUN: 10 mg/dL (ref 6–23)
CO2: 31 meq/L (ref 19–32)
Calcium: 8.9 mg/dL (ref 8.4–10.5)
Chloride: 101 meq/L (ref 96–112)
Creatinine, Ser: 0.73 mg/dL (ref 0.40–1.20)
GFR: 106.41 mL/min (ref >60.00)
Glucose, Bld: 75 mg/dL (ref 70–99)
Potassium: 3.9 meq/L (ref 3.5–5.1)
Sodium: 137 meq/L (ref 135–145)

## 2024-01-12 LAB — PROTIME-INR
INR: 1.2 ratio — ABNORMAL HIGH (ref 0.8–1.0)
Prothrombin Time: 12.3 s (ref 9.6–13.1)

## 2024-01-12 LAB — TSH: TSH: 2.09 u[IU]/mL (ref 0.35–5.50)

## 2024-01-12 MED ORDER — TIRZEPATIDE-WEIGHT MANAGEMENT 7.5 MG/0.5ML ~~LOC~~ SOLN: 7.5000 mg | SUBCUTANEOUS | 0 refills | Status: AC

## 2024-01-13 ENCOUNTER — Other Ambulatory Visit

## 2024-01-13 DIAGNOSIS — D539 Nutritional anemia, unspecified: Secondary | ICD-10-CM | POA: Diagnosis not present

## 2024-01-13 LAB — IBC + FERRITIN
Ferritin: 183.4 ng/mL (ref 10.0–291.0)
Iron: 82 ug/dL (ref 42–145)
Saturation Ratios: 21.9 % (ref 20.0–50.0)
TIBC: 373.8 ug/dL (ref 250.0–450.0)
Transferrin: 267 mg/dL (ref 212.0–360.0)

## 2024-01-13 LAB — VITAMIN B12: Vitamin B-12: 480 pg/mL (ref 211–911)

## 2024-01-13 LAB — FOLATE: Folate: >23.7 ng/mL (ref >5.9)

## 2024-01-14 NOTE — Telephone Encounter (Signed)
Labs were collected

## 2024-01-21 LAB — ZINC: Zinc: 70 ug/dL (ref 60–130)

## 2024-01-21 LAB — RETICULOCYTES
ABS Retic: 64480 {cells}/uL (ref 20000–80000)
Retic Ct Pct: 1.6 %

## 2024-01-21 LAB — VITAMIN B1: Vitamin B1 (Thiamine): 62 nmol/L — ABNORMAL HIGH (ref 8–30)

## 2024-02-05 ENCOUNTER — Encounter (HOSPITAL_BASED_OUTPATIENT_CLINIC_OR_DEPARTMENT_OTHER): Payer: Self-pay | Admitting: *Deleted

## 2024-02-05 ENCOUNTER — Other Ambulatory Visit: Payer: Self-pay

## 2024-02-05 ENCOUNTER — Emergency Department (HOSPITAL_BASED_OUTPATIENT_CLINIC_OR_DEPARTMENT_OTHER)
Admission: EM | Admit: 2024-02-05 | Discharge: 2024-02-06 | Disposition: A | Attending: Emergency Medicine | Admitting: Emergency Medicine

## 2024-02-05 DIAGNOSIS — R000 Tachycardia, unspecified: Secondary | ICD-10-CM | POA: Diagnosis not present

## 2024-02-05 DIAGNOSIS — R1084 Generalized abdominal pain: Secondary | ICD-10-CM | POA: Insufficient documentation

## 2024-02-05 DIAGNOSIS — R197 Diarrhea, unspecified: Secondary | ICD-10-CM | POA: Insufficient documentation

## 2024-02-05 DIAGNOSIS — R112 Nausea with vomiting, unspecified: Secondary | ICD-10-CM | POA: Insufficient documentation

## 2024-02-05 MED ORDER — SODIUM CHLORIDE 0.9 % IV BOLUS
1000.0000 mL | Freq: Once | INTRAVENOUS | Status: AC
  Administered 2024-02-06: 1000 mL via INTRAVENOUS

## 2024-02-05 MED ORDER — ONDANSETRON HCL 4 MG/2ML IJ SOLN
4.0000 mg | Freq: Once | INTRAMUSCULAR | Status: AC
  Administered 2024-02-06: 4 mg via INTRAVENOUS

## 2024-02-05 NOTE — ED Provider Notes (Signed)
 " [place] EMERGENCY DEPARTMENT AT [facility] Provider Note   CSN: [id number] Arrival date & time: 02/05/24  2337     Patient presents with: Emesis   Erin Harris is a 34 y.o. female.  {Add pertinent medical, surgical, social history, OB history to YEP:67052} Presents to the emergency department for evaluation of nausea, vomiting and diarrhea.  Patient reports that symptoms have been present for 2 days.  She had sudden onset of symptoms at work on Thursday, repeated vomiting for 1 day which is now improving, continuing to have voluminous watery diarrhea.  Patient reports that she is feeling weak and feels like her heart is racing.  No upper respiratory infectious symptoms.  Denies abdominal pain.       Prior to Admission medications  Medication Sig Start Date End Date Taking? Authorizing Provider  AUVELITY 45-105 MG TBCR  12/14/23   [provider]  baclofen  (LIORESAL ) 10 MG tablet Take 1 tablet (10 mg total) by mouth 3 (three) times daily as needed for muscle spasms. 07/14/23   Skeet Juliene SAUNDERS, DO  buPROPion  (WELLBUTRIN  XL) 300 MG 24 hr tablet Take 1 tablet (300 mg total) by mouth daily. 08/29/20   Joshua Debby CROME, MD  CRYSELLE -28 0.3-30 MG-MCG tablet TAKE 1 TABLET BY MOUTH EVERY DAY 09/28/20   Joshua Debby CROME, MD  JORNAY PM 40 MG CP24 Take 1 capsule by mouth at bedtime. 06/15/23   [provider]  ondansetron  (ZOFRAN -ODT) 4 MG disintegrating tablet Take 1 tablet (4 mg total) by mouth every 8 (eight) hours as needed for nausea or vomiting. 07/14/23   Skeet Juliene SAUNDERS, DO  prazosin (MINIPRESS) 1 MG capsule Take 1 mg by mouth at bedtime.    [provider]  promethazine  (PHENERGAN ) 25 MG tablet Take 1 tablet (25 mg total) by mouth every 6 (six) hours as needed for nausea or vomiting. 07/14/23   Skeet Juliene SAUNDERS, DO  propranolol  ER (INDERAL  LA) 60 MG 24 hr capsule TAKE 1 CAPSULE BY MOUTH EVERY DAY 12/16/23   Joshua Debby CROME, MD  QELBREE 200 MG 24 hr capsule Take 200  mg by mouth daily. 1 tablet in the AM 2 tablets in the PM    [provider]  QULIPTA  60 MG TABS TAKE 1 TABLET BY MOUTH EVERY DAY 12/29/23   Skeet, Adam R, DO  Rimegepant Sulfate (NURTEC) 75 MG TBDP Take 1 tablet (75 mg total) by mouth daily as needed. 07/14/23   Skeet Juliene SAUNDERS, DO  rizatriptan  (MAXALT -MLT) 10 MG disintegrating tablet Take 1 tablet earliest onset of migraine.  May repeat in 2 hours if needed.  Maximum 2 tablets in 24 hours. 07/14/23   Skeet Juliene SAUNDERS, DO  tirzepatide  7.5 MG/0.5ML injection vial Inject 7.5 mg into the skin once a week. 01/12/24   Joshua Debby CROME, MD  traZODone  (DESYREL ) 50 MG tablet Take 50 mg by mouth at bedtime as needed. 12/12/22   [provider]  venlafaxine  XR (EFFEXOR -XR) 150 MG 24 hr capsule Take 1 capsule (150 mg total) by mouth daily. 08/29/20   Joshua Debby CROME, MD    Allergies: Imitrex [sumatriptan], Lactose, and Nickel    Review of Systems  Updated Vital Signs BP (!) 133/107 (BP Location: Right Arm)   Pulse (!) 129   Temp 97.8 F (36.6 C)   Resp 16   LMP 01/31/2024 (Exact Date)   SpO2 100%   Physical Exam Vitals and nursing note reviewed.  Constitutional:  General: She is not in acute distress.    Appearance: She is well-developed.  HENT:     Head: Normocephalic and atraumatic.     Mouth/Throat:     Mouth: Mucous membranes are moist.  Eyes:     General: Vision grossly intact. Gaze aligned appropriately.     Extraocular Movements: Extraocular movements intact.     Conjunctiva/sclera: Conjunctivae normal.  Cardiovascular:     Rate and Rhythm: Regular rhythm. Tachycardia present.     Pulses: Normal pulses.     Heart sounds: Normal heart sounds, S1 normal and S2 normal. No murmur heard.    No friction rub. No gallop.  Pulmonary:     Effort: Pulmonary effort is normal. No respiratory distress.     Breath sounds: Normal breath sounds.  Abdominal:     General: Bowel sounds are normal.     Palpations: Abdomen is soft.      Tenderness: There is no abdominal tenderness. There is no guarding or rebound.     Hernia: No hernia is present.  Musculoskeletal:        General: No swelling.     Cervical back: Full passive range of motion without pain, normal range of motion and neck supple. No spinous process tenderness or muscular tenderness. Normal range of motion.     Right lower leg: No edema.     Left lower leg: No edema.  Skin:    General: Skin is warm and dry.     Capillary Refill: Capillary refill takes less than 2 seconds.     Findings: No ecchymosis, erythema, rash or wound.  Neurological:     General: No focal deficit present.     Mental Status: She is alert and oriented to person, place, and time.     GCS: GCS eye subscore is 4. GCS verbal subscore is 5. GCS motor subscore is 6.     Cranial Nerves: Cranial nerves 2-12 are intact.     Sensory: Sensation is intact.     Motor: Motor function is intact.     Coordination: Coordination is intact.  Psychiatric:        Attention and Perception: Attention normal.        Mood and Affect: Mood normal.        Speech: Speech normal.        Behavior: Behavior normal.     (all labs ordered are listed, but only abnormal results are displayed) Labs Reviewed  LIPASE, BLOOD  COMPREHENSIVE METABOLIC PANEL WITH GFR  CBC  URINALYSIS, ROUTINE W REFLEX MICROSCOPIC  PREGNANCY, URINE    EKG: None  Radiology: No results found.  {Document cardiac monitor, telemetry assessment procedure when appropriate:32947} Procedures   Medications Ordered in the ED  ondansetron  (ZOFRAN ) injection 4 mg (has no administration in time range)  sodium chloride  0.9 % bolus 1,000 mL (has no administration in time range)    Followed by  sodium chloride  0.9 % bolus 1,000 mL (has no administration in time range)      {Click here for ABCD2, HEART and other calculators REFRESH Note before signing:1}                              Medical Decision Making Amount and/or Complexity of  Data Reviewed Labs: ordered.  Risk Prescription drug management.   ***  {Document critical care time when appropriate  Document review of labs and clinical decision tools ie CHADS2VASC2, etc  Document your independent review of radiology images and any outside records  Document your discussion with family members, caretakers and with consultants  Document social determinants of health affecting pt's care  Document your decision making why or why not admission, treatments were needed:32947:::1}   Final diagnoses:  None    ED Discharge Orders     None        "

## 2024-02-05 NOTE — ED Triage Notes (Signed)
 Pt endorses since Thursday she has had diarrhea and vomiting. She says the vomiting has slowed down, but she is still nauseated and having liquid stools. Unsure of fevers.

## 2024-02-06 LAB — URINALYSIS, ROUTINE W REFLEX MICROSCOPIC
Bilirubin Urine: NEGATIVE
Glucose, UA: NEGATIVE mg/dL
Ketones, ur: 15 mg/dL — AB
Nitrite: NEGATIVE
Specific Gravity, Urine: 1.02 (ref 1.005–1.030)
pH: 5.5 (ref 5.0–8.0)

## 2024-02-06 LAB — COMPREHENSIVE METABOLIC PANEL WITH GFR
ALT: 35 U/L (ref 0–44)
AST: 22 U/L (ref 15–41)
Albumin: 4.8 g/dL (ref 3.5–5.0)
Alkaline Phosphatase: 77 U/L (ref 38–126)
Anion gap: 16 — ABNORMAL HIGH (ref 5–15)
BUN: 10 mg/dL (ref 6–20)
CO2: 22 mmol/L (ref 22–32)
Calcium: 10.2 mg/dL (ref 8.9–10.3)
Chloride: 103 mmol/L (ref 98–111)
Creatinine, Ser: 0.79 mg/dL (ref 0.44–1.00)
GFR, Estimated: >60 mL/min
Glucose, Bld: 123 mg/dL — ABNORMAL HIGH (ref 70–99)
Potassium: 3.1 mmol/L — ABNORMAL LOW (ref 3.5–5.1)
Sodium: 141 mmol/L (ref 135–145)
Total Bilirubin: 1.1 mg/dL (ref 0.0–1.2)
Total Protein: 8.5 g/dL — ABNORMAL HIGH (ref 6.5–8.1)

## 2024-02-06 LAB — CBC
HCT: 44.5 % (ref 36.0–46.0)
Hemoglobin: 15.8 g/dL — ABNORMAL HIGH (ref 12.0–15.0)
MCH: 32.8 pg (ref 26.0–34.0)
MCHC: 35.5 g/dL (ref 30.0–36.0)
MCV: 92.5 fL (ref 80.0–100.0)
Platelets: 268 K/uL (ref 150–400)
RBC: 4.81 MIL/uL (ref 3.87–5.11)
RDW: 11.9 % (ref 11.5–15.5)
WBC: 16.1 K/uL — ABNORMAL HIGH (ref 4.0–10.5)
nRBC: 0 % (ref 0.0–0.2)

## 2024-02-06 LAB — LIPASE, BLOOD: Lipase: 47 U/L (ref 11–51)

## 2024-02-06 LAB — PREGNANCY, URINE: Preg Test, Ur: NEGATIVE

## 2024-02-06 MED ORDER — DIPHENOXYLATE-ATROPINE 2.5-0.025 MG PO TABS
2.0000 | ORAL_TABLET | Freq: Once | ORAL | Status: AC
  Administered 2024-02-06: 2 via ORAL
  Filled 2024-02-06: qty 2

## 2024-02-06 MED ORDER — DIPHENOXYLATE-ATROPINE 2.5-0.025 MG PO TABS: 1.0000 | ORAL_TABLET | Freq: Four times a day (QID) | ORAL | 0 refills | Status: DC | PRN

## 2024-02-06 MED ORDER — ONDANSETRON HCL 4 MG/2ML IJ SOLN
4.0000 mg | Freq: Once | INTRAMUSCULAR | Status: AC
  Administered 2024-02-06: 4 mg via INTRAVENOUS
  Filled 2024-02-06: qty 2

## 2024-02-06 MED ORDER — HYOSCYAMINE SULFATE 0.125 MG SL SUBL
0.1250 mg | SUBLINGUAL_TABLET | Freq: Once | SUBLINGUAL | Status: AC
  Administered 2024-02-06: 0.125 mg via SUBLINGUAL
  Filled 2024-02-06: qty 1

## 2024-02-06 MED ORDER — ONDANSETRON 4 MG PO TBDP: ORAL_TABLET | ORAL | 0 refills | Status: AC

## 2024-02-11 ENCOUNTER — Encounter: Payer: Self-pay | Admitting: Neurology

## 2024-02-11 NOTE — Telephone Encounter (Signed)
 Medication Samples have been provided to the patient.  Drug name: Nurtec       Strength: 75 mg        Qty: 2  LOT: [id number]  Exp.Date: 11/28  Dosing instructions: as needed  The patient has been instructed regarding the correct time, dose, and frequency of taking this medication, including desired effects and most common side effects.   Erin Harris 2:14 PM 02/11/2024   Medication Samples have been provided to the patient.  Drug name: Qulipta        Strength: 60 mg        Qty: 4  LOT: [id number]  Exp.Date: 9/27  Dosing instructions: Daily  The patient has been instructed regarding the correct time, dose, and frequency of taking this medication, including desired effects and most common side effects.   Erin Harris 2:15 PM 02/11/2024

## 2024-02-21 NOTE — Progress Notes (Unsigned)
 "  Virtual Visit via Video Note  Consent was obtained for video visit:  Yes.   Answered questions that patient had about telehealth interaction:  Yes.   I discussed the limitations, risks, security and privacy concerns of performing an evaluation and management service by telemedicine. I also discussed with the patient that there may be a patient responsible charge related to this service. The patient expressed understanding and agreed to proceed.  Pt location: Home Physician Location: office Name of referring provider:  Joshua Debby CROME, MD I connected with Gerard Large at patients initiation/request on 02/22/2024 at  3:30 PM EST by video enabled telemedicine application and verified that I am speaking with the correct person using two identifiers. Pt MRN:  [id number] Pt DOB:  1990/10/25 Video Participants:  Gerard Large  Assessment/Plan:   Migraine without aura, without status migrainosus, intractable, improved Cervicogenic headache Cervicalgia, myofascial   Migraine prevention:  Qulipta  60mg  daily, propranolol  ER 60mg  daily (helps with tachycardia as well).  Also on venlafaxine  XR 300mg  daily for anxiety which also is helpful for migraine.  Migraine rescue:  Nurtec PRN, rizatriptan  second line  Muscle spasms:  baclofen  Limit use of pain relievers to no more than 9 days out of month to prevent risk of rebound or medication-overuse headache. Keep headache diary Follow up 6 months.       Subjective:  Erin Harris is a 34 year old female with PTSD, depression, IBS, tachycardia and migraine who follows up for migraine and neck pain.   UPDATE: Migraines: *** Intensity:  6-7/10 Duration:  30-60 minutes with Nurtec (not needed rizatriptan ) Frequency:  2 to 3 in 6 months.  Cervicogenic headaches and neck pain,  Started methocarbamol  but prefers baclofen . She works on neck stretches at home.  Sometimes gets dry needling.   Mild tension type headache 3 days a month  Frequency of  abortive medication: rarely Current NSAIDS/analgesics:  none Current triptans:  rizatriptan  10mg  (no longer using) Current ergotamine:  none Current anti-emetic:  Zofran  ODT 4-8mg  Current muscle relaxants: baclofen , methocarbamol   Current Antihypertensive medications:  propranolol  ER 60mg  daily Current Antidepressant medications:  venlafaxine  XR 300mg  daily, bupropion  XL 300mg  daily Current Anticonvulsant medications:  none Current anti-CGRP:  Qulipta  60mg  daily, Nurtec (rescue) Current Vitamins/Herbal/Supplements:  none Current Antihistamines/Decongestants:  none Other therapy:  none Hormone/birth control:  none   Caffeine:  None Diet:  Hydrates with water.  No soda.   Exercise:  not routine Depression:  yes; Anxiety:  yes Other pain:  no Sleep hygiene:  okay.  Trazodone  helps.     HISTORY:  Onset:  headaches in high school.  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  Location:  base of skull/back of neck radiating to jaw and ears bilaterally Quality:  throbbing, pressure Intensity:  6-7/10.   Aura:  absent.  One time had central blurred vision in left eye lasting 12 hours. Prodrome:  absent Associated symptoms:  Nausea, photophobia, phonophobia, sometimes vomiting.  She denies associated visual disturbance, unilateral numbness or weakness. Duration:  3-4 days (within a few hours with Ubrelvy ) Frequency:  once a week to once every other week (1 every 2 months on Qulipta ) Frequency of abortive medication: ibuprofen  1 to 2 days a week Triggers:  stress, poor sleep, poor diet, Chinese food, chocolate, hormonal Relieving factors:  resting in quiet dark room Activity:  aggravates In between, keeps a constant low-grade dull frontal/periorbital-maxillary headache   MRI of brain without contrast on 03/09/2020 was normal.  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   Past NSAIDS/analgesics:  Excedrin, acetaminophen , ibuprofen  Past abortive triptans:  sumatriptan (caused palpitations) Past abortive ergotamine:  none Past muscle relaxants:  Flexeril, tizanidine  (did not like how it made her feel), methocarbamol  Past anti-emetic:  promethazine  (effective) Past antihypertensive medications:  metoprolol Past antidepressant medications:  citalopram Past anticonvulsant medications:  none Past anti-CGRP:  Aimovig  140mg , Ubrelvy  100mg  (effective but no longer covered) Past vitamins/Herbal/Supplements:  none Past antihistamines/decongestants:  none Other past therapies:  trigger point injections, dry needling, PT     Family history of migraines:  mother  Past Medical History: Past Medical History:  Diagnosis Date   Allergy    Depression    Frequent headaches    IBS (irritable bowel syndrome)    Migraine    PTSD (post-traumatic stress disorder)    UTI (urinary tract infection)     Medications: Outpatient Encounter Medications as of 02/22/2024  Medication Sig   AUVELITY 45-105 MG TBCR    baclofen  (LIORESAL ) 10 MG tablet Take 1 tablet (10 mg total) by mouth 3 (three) times daily as needed for muscle spasms.   buPROPion  (WELLBUTRIN  XL) 300 MG 24 hr tablet Take 1 tablet (300 mg total) by mouth daily.   CRYSELLE -28 0.3-30 MG-MCG tablet TAKE 1 TABLET BY MOUTH EVERY DAY   diphenoxylate -atropine  (LOMOTIL ) 2.5-0.025 MG tablet Take 1-2 tablets by mouth 4 (four) times daily as needed for diarrhea or loose stools.   JORNAY PM 40 MG CP24 Take 1 capsule by mouth at bedtime.   ondansetron  (ZOFRAN -ODT) 4 MG disintegrating tablet Take 1 tablet (4 mg total) by mouth every 8 (eight) hours as needed for nausea or vomiting.   ondansetron  (ZOFRAN -ODT) 4 MG disintegrating tablet 4mg  ODT q4 hours prn nausea/vomit   prazosin (MINIPRESS) 1 MG capsule Take 1 mg by mouth at bedtime.    promethazine  (PHENERGAN ) 25 MG tablet Take 1 tablet (25 mg total) by mouth every 6 (six) hours as needed for nausea or vomiting.   propranolol  ER (INDERAL  LA) 60 MG 24 hr capsule TAKE 1 CAPSULE BY MOUTH EVERY DAY   QELBREE 200 MG 24 hr capsule Take 200 mg by mouth daily. 1 tablet in the AM 2 tablets in the PM   QULIPTA  60 MG TABS TAKE 1 TABLET BY MOUTH EVERY DAY   Rimegepant Sulfate  (NURTEC) 75 MG TBDP Take 1 tablet (75 mg total) by mouth daily as needed.   rizatriptan  (MAXALT -MLT) 10 MG disintegrating tablet Take 1 tablet earliest onset of migraine.  May repeat in 2 hours if needed.  Maximum 2 tablets in 24 hours.   tirzepatide  7.5 MG/0.5ML injection vial Inject 7.5 mg into the skin once a week.   traZODone  (DESYREL ) 50 MG tablet Take 50 mg by mouth at bedtime as needed.   venlafaxine  XR (EFFEXOR -XR) 150 MG 24 hr capsule Take 1 capsule (150 mg total) by mouth daily.   No facility-administered encounter medications on file as of 02/22/2024.    Allergies: Allergies  Allergen Reactions   Imitrex [Sumatriptan] Palpitations   Lactose Other (See Comments)    GI Issues   Nickel Rash    Family History: Family History  Problem Relation Age of Onset   Migraines Mother    Diabetes Father    Hyperlipidemia Father    Hypertension Father  Hearing loss Maternal Grandfather    Heart disease Maternal Grandfather    Early death Paternal Grandmother    Heart disease Paternal Grandmother    Hearing loss Paternal Grandfather    Cancer Paternal Grandfather    Cancer Sister    Birth defects Sister    Heart disease Sister    Alcohol abuse Brother     Observations/Objective:   No acute distress.  Alert and oriented.  Speech fluent and not dysarthric.  Language intact.     Follow Up Instructions:    -I discussed the assessment and treatment plan with the patient. The patient was provided an opportunity to ask questions and all were answered. The patient agreed with the plan and demonstrated  an understanding of the instructions.   The patient was advised to call back or seek an in-person evaluation if the symptoms worsen or if the condition fails to improve as anticipated.   Juliene Lamar Dunnings, DO  "

## 2024-02-22 ENCOUNTER — Other Ambulatory Visit (HOSPITAL_COMMUNITY): Payer: Self-pay

## 2024-02-22 ENCOUNTER — Encounter: Payer: Self-pay | Admitting: Neurology

## 2024-02-22 ENCOUNTER — Telehealth: Admitting: Neurology

## 2024-02-22 DIAGNOSIS — G43009 Migraine without aura, not intractable, without status migrainosus: Secondary | ICD-10-CM | POA: Diagnosis not present

## 2024-02-22 DIAGNOSIS — M542 Cervicalgia: Secondary | ICD-10-CM

## 2024-02-22 DIAGNOSIS — G4486 Cervicogenic headache: Secondary | ICD-10-CM | POA: Diagnosis not present

## 2024-02-22 MED ORDER — BACLOFEN 10 MG PO TABS
10.0000 mg | ORAL_TABLET | Freq: Three times a day (TID) | ORAL | 5 refills | Status: AC | PRN
  Filled 2024-02-22: qty 90, 30d supply, fill #0

## 2024-02-22 MED ORDER — QULIPTA 60 MG PO TABS
1.0000 | ORAL_TABLET | Freq: Every day | ORAL | 5 refills | Status: AC
  Filled 2024-02-22: qty 30, 30d supply, fill #0

## 2024-02-22 MED ORDER — RIZATRIPTAN BENZOATE 10 MG PO TBDP
ORAL_TABLET | ORAL | 5 refills | Status: AC
  Filled 2024-02-22: qty 10, 30d supply, fill #0

## 2024-02-22 MED ORDER — NURTEC 75 MG PO TBDP
75.0000 mg | ORAL_TABLET | Freq: Every day | ORAL | 11 refills | Status: AC | PRN
  Filled 2024-02-22: qty 8, 8d supply, fill #0

## 2024-03-02 ENCOUNTER — Other Ambulatory Visit (HOSPITAL_COMMUNITY): Payer: Self-pay
